# Patient Record
Sex: Male | Born: 1955 | Race: White | Hispanic: No | Marital: Married | Smoking: Former smoker
Health system: Southern US, Community
[De-identification: ages and names within clinical notes are randomized; demographics above are authoritative.]

## PROBLEM LIST (undated history)

## (undated) DIAGNOSIS — S82839A Other fracture of upper and lower end of unspecified fibula, initial encounter for closed fracture: Secondary | ICD-10-CM

## (undated) DIAGNOSIS — N200 Calculus of kidney: Secondary | ICD-10-CM

## (undated) DIAGNOSIS — Z923 Personal history of irradiation: Secondary | ICD-10-CM

## (undated) HISTORY — PX: COLONOSCOPY: SHX174

---

## 2004-12-26 ENCOUNTER — Ambulatory Visit (HOSPITAL_COMMUNITY): Admission: RE | Admit: 2004-12-26 | Discharge: 2004-12-26 | Payer: Self-pay

## 2006-11-22 ENCOUNTER — Ambulatory Visit: Payer: Self-pay | Admitting: Cardiology

## 2006-11-29 ENCOUNTER — Ambulatory Visit: Payer: Self-pay

## 2006-12-15 ENCOUNTER — Ambulatory Visit: Payer: Self-pay | Admitting: Cardiology

## 2006-12-22 ENCOUNTER — Ambulatory Visit: Payer: Self-pay | Admitting: Internal Medicine

## 2009-02-06 ENCOUNTER — Encounter: Admission: RE | Admit: 2009-02-06 | Discharge: 2009-02-06 | Payer: Self-pay | Admitting: Family Medicine

## 2011-04-02 NOTE — Assessment & Plan Note (Signed)
[facility]                            CARDIOLOGY OFFICE NOTE   TYREES, CHOPIN                    MRN:          [id number]  DATE:12/15/2006                            DOB:          [birth year]    REASON FOR VISIT:  Followup cardiac testing.   HISTORY OF PRESENT ILLNESS:  I saw Mr. Zeiss back on the 8th of  January.  His history is detailed in my previous note.  He was referred  by Dr. Andi Devon with history of fairly atypical left chest, left flank  and left arm discomfort described as a pin-like sensation.  This was  in the setting, however, of moderate pretest probability for coronary  artery disease associated with probable hypertension and a remote  tobacco use history.  I referred him for a baseline exercise Myoview,  which revealed no electrocardiographic abnormalities suggestive of  ischemia.  The perfusion data showed an ejection fraction of 62% with no  wall motion abnormalities.  No clear evidence of scar or ischemia was  noted with some equivocal apical and inferior basal thinning, which  could potentially have been artifactual.  Overall, this was a low-risk  study, and I reviewed this with the patient and his wife today.  He has,  in fact, noted no symptoms since I last saw him.  Today, I underscored  for him the importance of establishing with a primary care Georgi Navarrete to  follow up on his blood pressure and for other risk modification  strategies and health maintenance.  He has not had a regular physician  for quite some time.  I offered to make a referral to one our nearby  regional offices.   ALLERGIES:  NO KNOWN DRUG ALLERGIES.   PRESENT MEDICATIONS:  None chronically.   REVIEW OF SYSTEMS:  As described in history of present illness.   EXAMINATION:  Blood pressure today is 138/82.  Heart rate is 68.  Weight  is 243 pounds.  Patient is comfortable and in no acute distress.  There were no major changes in his baseline  examination compared to his  last visit.   IMPRESSION AND RECOMMENDATIONS:  1. Low risk Myocardial perfusion study as outlined in the setting of      fairly atypical symptoms for ischemia.  I think at this point      observation and referral for primary care evaluation for basic risk      factor modification and health maintenance are in order.  Would not      pursue any additional ischemia evaluation at this point.  We will      make a referral to one of our regional primary care offices, and he      can proceed from there.  2. Cardiology followup will be p.r.n.     Jonelle Sidle, MD  Electronically Signed    SGM/MedQ  DD: 12/15/2006  DT: 12/15/2006  Job #: [id number]

## 2011-10-16 DIAGNOSIS — S82839A Other fracture of upper and lower end of unspecified fibula, initial encounter for closed fracture: Secondary | ICD-10-CM

## 2011-10-16 HISTORY — DX: Other fracture of upper and lower end of unspecified fibula, initial encounter for closed fracture: S82.839A

## 2011-10-30 ENCOUNTER — Emergency Department (HOSPITAL_COMMUNITY)
Admission: EM | Admit: 2011-10-30 | Discharge: 2011-10-30 | Disposition: A | Discharge status: Home / Self Care | Payer: BC Managed Care – PPO | Attending: Emergency Medicine | Admitting: Emergency Medicine

## 2011-10-30 ENCOUNTER — Emergency Department (HOSPITAL_COMMUNITY): Payer: BC Managed Care – PPO

## 2011-10-30 ENCOUNTER — Encounter: Payer: Self-pay | Admitting: *Deleted

## 2011-10-30 DIAGNOSIS — W11XXXA Fall on and from ladder, initial encounter: Secondary | ICD-10-CM | POA: Insufficient documentation

## 2011-10-30 DIAGNOSIS — Y990 Civilian activity done for income or pay: Secondary | ICD-10-CM | POA: Insufficient documentation

## 2011-10-30 DIAGNOSIS — M25579 Pain in unspecified ankle and joints of unspecified foot: Secondary | ICD-10-CM | POA: Insufficient documentation

## 2011-10-30 DIAGNOSIS — E119 Type 2 diabetes mellitus without complications: Secondary | ICD-10-CM | POA: Insufficient documentation

## 2011-10-30 DIAGNOSIS — S82409A Unspecified fracture of shaft of unspecified fibula, initial encounter for closed fracture: Secondary | ICD-10-CM | POA: Insufficient documentation

## 2011-10-30 DIAGNOSIS — Y9269 Other specified industrial and construction area as the place of occurrence of the external cause: Secondary | ICD-10-CM | POA: Insufficient documentation

## 2011-10-30 MED ORDER — HYDROCODONE-ACETAMINOPHEN 5-325 MG PO TABS: 1.0000 | ORAL_TABLET | ORAL | Status: AC | PRN

## 2011-10-30 MED ORDER — IBUPROFEN 800 MG PO TABS: 800.0000 mg | ORAL_TABLET | Freq: Three times a day (TID) | ORAL | Status: AC

## 2011-10-30 NOTE — ED Provider Notes (Signed)
History     CSN: [id number] Arrival date & time: 10/30/2011  3:56 PM   First MD Initiated Contact with Patient 10/30/11 1617      Chief Complaint  Patient presents with  . Ankle Pain    fall    (Consider location/radiation/quality/duration/timing/severity/associated sxs/prior treatment) HPI Comments: Patient reports that he was at work and his foot slipped while he was on a ladder.  His left ankle then twisted and he landed on his left foot.  He has been having pain and swelling of the left ankle since that time.  Patient is a 55 y.o. male presenting with ankle pain. The history is provided by the patient.  Ankle Pain  The incident occurred 1 to 2 hours ago. The incident occurred at work. The pain is present in the left ankle. The pain is moderate. Pertinent negatives include no numbness, no inability to bear weight, no loss of motion, no muscle weakness, no loss of sensation and no tingling. He reports no foreign bodies present.    Past Medical History  Diagnosis Date  . Diabetes mellitus     History reviewed. No pertinent past surgical history.  No family history on file.  History  Substance Use Topics  . Smoking status: Never Smoker   . Smokeless tobacco: Not on file  . Alcohol Use: No      Review of Systems  Constitutional: Negative for fever and chills.  HENT: Negative for neck pain and neck stiffness.   Eyes: Negative for visual disturbance.  Respiratory: Negative for chest tightness and shortness of breath.   Cardiovascular: Negative for chest pain.  Gastrointestinal: Negative for abdominal pain.  Musculoskeletal: Positive for joint swelling. Negative for back pain.  Neurological: Negative for dizziness, tingling, syncope, light-headedness and numbness.  Psychiatric/Behavioral: Negative for confusion.    Allergies  Review of patient's allergies indicates no known allergies.  Home Medications   Current Outpatient Rx  Name Route Sig Dispense Refill  .  METFORMIN HCL 500 MG PO TABS Oral Take 500 mg by mouth 2 (two) times daily with a meal.        BP 159/88  Pulse 90  Temp(Src) 98.7 F (37.1 C) (Oral)  Resp 20  SpO2 99%  Physical Exam  Constitutional: He is oriented to person, place, and time. He appears well-developed and well-nourished.  HENT:  Head: Normocephalic and atraumatic.  Neck: Normal range of motion. Neck supple.  Cardiovascular: Normal rate, regular rhythm and normal heart sounds.   Pulmonary/Chest: Effort normal and breath sounds normal.  Musculoskeletal:       Left knee: Normal.       Left ankle: He exhibits decreased range of motion and swelling. He exhibits no ecchymosis, no deformity, no laceration and normal pulse. tenderness. Lateral malleolus tenderness found.       Significant swelling of the lateral malleolus.  Patient able to move toes.  Normal sensation.  Dorsal pedis pulse 2+ on left foot.    Neurological: He is alert and oriented to person, place, and time.  Skin: Skin is warm and dry.  Psychiatric: He has a normal mood and affect.    ED Course  Procedures (including critical care time)  Labs Reviewed - No data to display Dg Ankle Complete Left  10/30/2011  *RADIOLOGY REPORT*  Clinical Data: Larey Seat from ladder  LEFT ANKLE COMPLETE - 3+ VIEW  Comparison: None  Findings: Lateral soft tissue swelling noted.  There is a minimally displaced fracture deformity involving the distal fibula.  Mild posterior displacement of the distal fracture fragments.  IMPRESSION:  1.  Distal fibular fracture with mild posterior displacement of the distal fracture fragments. 2.  Soft tissue swelling.  Original Report Authenticated By: Rosealee Albee, M.D.     1. Closed fibular fracture    Discussed results of the xray with Dr. Lynelle Doctor who recommended a posterior splint and stirrup along with crutches.   Patient splinted by ortho tech in the ED and given crutches.    MDM  Closed fracture of the fibula.  Neurovascularly  intact.  Will have patient follow up with Orthopedics.        Pascal Lux Wingen 10/31/11 0402

## 2011-10-30 NOTE — ED Notes (Signed)
Pt fell off ladder with injury to left ankle

## 2011-10-31 NOTE — ED Provider Notes (Signed)
Pt discussed with me Medical screening examination/treatment/procedure(s) were performed by non-physician practitioner and as supervising physician I was immediately available for consultation/collaboration. Devoria Albe, MD, Armando Gang   Ward Givens, MD 10/31/11 [phone]

## 2012-01-25 ENCOUNTER — Encounter (HOSPITAL_COMMUNITY): Payer: Self-pay | Admitting: *Deleted

## 2012-01-25 ENCOUNTER — Emergency Department (HOSPITAL_COMMUNITY)
Admission: EM | Admit: 2012-01-25 | Discharge: 2012-01-26 | Disposition: A | Discharge status: Home / Self Care | Payer: BC Managed Care – PPO | Attending: Emergency Medicine | Admitting: Emergency Medicine

## 2012-01-25 ENCOUNTER — Emergency Department (HOSPITAL_COMMUNITY): Payer: BC Managed Care – PPO

## 2012-01-25 DIAGNOSIS — N200 Calculus of kidney: Secondary | ICD-10-CM | POA: Insufficient documentation

## 2012-01-25 DIAGNOSIS — R109 Unspecified abdominal pain: Secondary | ICD-10-CM | POA: Insufficient documentation

## 2012-01-25 DIAGNOSIS — R110 Nausea: Secondary | ICD-10-CM | POA: Insufficient documentation

## 2012-01-25 DIAGNOSIS — E119 Type 2 diabetes mellitus without complications: Secondary | ICD-10-CM | POA: Insufficient documentation

## 2012-01-25 LAB — URINALYSIS, ROUTINE W REFLEX MICROSCOPIC
Ketones, ur: 40 mg/dL — AB
Leukocytes, UA: NEGATIVE
Nitrite: NEGATIVE
Protein, ur: NEGATIVE mg/dL
Urobilinogen, UA: 0.2 mg/dL (ref 0.0–1.0)

## 2012-01-25 LAB — POCT I-STAT, CHEM 8
Calcium, Ion: 1.21 mmol/L (ref 1.12–1.32)
Glucose, Bld: 145 mg/dL — ABNORMAL HIGH (ref 70–99)
HCT: 47 % (ref 39.0–52.0)
Hemoglobin: 16 g/dL (ref 13.0–17.0)

## 2012-01-25 MED ORDER — SODIUM CHLORIDE 0.9 % IV BOLUS (SEPSIS): 1000.0000 mL | Freq: Once | INTRAVENOUS | Status: DC

## 2012-01-25 MED ORDER — HYDROMORPHONE HCL PF 1 MG/ML IJ SOLN
1.0000 mg | Freq: Once | INTRAMUSCULAR | Status: AC
  Administered 2012-01-26: 1 mg via INTRAVENOUS
  Filled 2012-01-25: qty 1

## 2012-01-25 MED ORDER — MORPHINE SULFATE 4 MG/ML IJ SOLN
4.0000 mg | Freq: Once | INTRAMUSCULAR | Status: AC
  Administered 2012-01-25: 4 mg via INTRAVENOUS
  Filled 2012-01-25: qty 1

## 2012-01-25 MED ORDER — SODIUM CHLORIDE 0.9 % IV BOLUS (SEPSIS)
1000.0000 mL | Freq: Once | INTRAVENOUS | Status: AC
  Administered 2012-01-26: 1000 mL via INTRAVENOUS

## 2012-01-25 MED ORDER — SODIUM CHLORIDE 0.9 % IV BOLUS (SEPSIS)
1000.0000 mL | Freq: Once | INTRAVENOUS | Status: AC
  Administered 2012-01-25: 1000 mL via INTRAVENOUS

## 2012-01-25 NOTE — ED Provider Notes (Signed)
History     CSN: [id number]  Arrival date & time 01/25/12  2008   First MD Initiated Contact with Patient 01/25/12 2137      Chief Complaint  Patient presents with  . Flank Pain    LT side    (Consider location/radiation/quality/duration/timing/severity/associated sxs/prior treatment) HPI He outpatient a 56 year old male presents today complaining of left-sided flank pain he ranks it 10 out of 10. This began suddenly at 4 PM. Patient was sitting still and began. This feels very similar to previous kidney stone. Nothing has made it better or worse. Patient has had nausea but no vomiting. He denies any fevers. Pain does radiate to the left lower abdomen. Patient denies any difficulties with constipation or diarrhea. Patient has been seen by Dr. Brunilda Payor in the past for this. He has not required surgical intervention previously. There are no other associated or modifying factors. Past Medical History  Diagnosis Date  . Diabetes mellitus     History reviewed. No pertinent past surgical history.  History reviewed. No pertinent family history.  History  Substance Use Topics  . Smoking status: Never Smoker   . Smokeless tobacco: Not on file  . Alcohol Use: No      Review of Systems  Constitutional: Negative.   HENT: Negative.   Eyes: Negative.   Respiratory: Negative.   Cardiovascular: Negative.   Gastrointestinal: Positive for nausea and abdominal pain.  Genitourinary: Positive for flank pain.  Musculoskeletal: Negative.   Skin: Negative.   Neurological: Negative.   Hematological: Negative.   Psychiatric/Behavioral: Negative.   All other systems reviewed and are negative.    Allergies  Review of patient's allergies indicates no known allergies.  Home Medications   Current Outpatient Rx  Name Route Sig Dispense Refill  . METFORMIN HCL 500 MG PO TABS Oral Take 500 mg by mouth 2 (two) times daily with a meal.     . OVER THE COUNTER MEDICATION Oral Take 1 tablet by  mouth daily. Melaleuca prostate "vitamin."      BP 159/82  Pulse 83  Temp(Src) 98.6 F (37 C) (Oral)  Resp 18  SpO2 98%  Physical Exam  Nursing note and vitals reviewed. GEN: Well-developed, well-nourished male in no distress HEENT: Atraumatic, normocephalic. Oropharynx clear without erythema EYES: PERRLA BL, no scleral icterus. NECK: Trachea midline, no meningismus CV: regular rate and rhythm. No murmurs, rubs, or gallops PULM: No respiratory distress.  No crackles, wheezes, or rales. GI: soft, non-tender. No guarding, rebound, or tenderness. + bowel sounds  GU: Patient with left costovertebral angle tenderness. Neuro: cranial nerves 2-12 intact, no abnormalities of strength or sensation, A and O x 3 MSK: Patient moves all 4 extremities symmetrically, no deformity, edema, or injury noted Skin: No rashes petechiae, purpura, or jaundice Psych: no abnormality of mood   ED Course  Procedures (including critical care time)  Labs Reviewed  GLUCOSE, CAPILLARY - Abnormal; Notable for the following:    Glucose-Capillary 142 (*)    All other components within normal limits  POCT I-STAT, CHEM 8 - Abnormal; Notable for the following:    Creatinine, Ser 1.40 (*)    Glucose, Bld 145 (*)    All other components within normal limits  URINALYSIS, ROUTINE W REFLEX MICROSCOPIC   Ct Abdomen Pelvis Wo Contrast  01/25/2012  *RADIOLOGY REPORT*  Clinical Data: Left flank pain  CT ABDOMEN AND PELVIS WITHOUT CONTRAST  Technique:  Multidetector CT imaging of the abdomen and pelvis was performed following the standard protocol  without intravenous contrast.  Comparison: 12/26/2004  Findings: Limited images through the lung bases demonstrate no significant appreciable abnormality. The heart size is within normal limits. No pleural or pericardial effusion.  Intra-abdominal organ evaluation is limited without intravenous contrast.  This limitation, fatty liver.  Unremarkable biliary system, spleen,  pancreas, adrenal glands.  Unremarkable right kidney.  Left renal edema and perinephric fat stranding.  There is mild left hydroureteronephrosis to the level of a 6 mm stone within the proximal left ureter.  Colonic diverticulosis without CT evidence for diverticulitis.  No bowel obstruction.  Normal appendix.  No free intraperitoneal air or fluid.  Prostatomegaly.  Decompressed bladder.  Normal caliber vasculature.  Multilevel degenerative changes of the imaged spine. No acute or aggressive appearing osseous lesion.  IMPRESSION: Mild left hydroureteronephrosis to the level of a 6 mm proximal left ureteral stone.  There is left renal edema and perinephric fat stranding.  Correlate with urinalysis if concern for superimposed infection.  Prostatomegaly.  Hepatic steatosis.  Original Report Authenticated By: Waneta Martins, M.D.     No diagnosis found.    MDM  Patient was evaluated by myself. Based on presentation he had workup for possible kidney stone. Patient did have elevated BUN/creatinine ratio with creatinine of 1.4. Also on CT of the abdomen and pelvis without contrast patient did have evidence of a 6 mm stone in the proximal left ureter with evidence of mild left hydro-ureter nephrosis. Patient was treated with IV fluids as well as morphine.   See down time form for patient final disposition and addendum to this note.     Cyndra Numbers, MD 01/26/12 [phone]

## 2012-01-25 NOTE — ED Notes (Signed)
Pt in c/o left flank pain since yesterday, worse today, nausea

## 2012-01-26 ENCOUNTER — Encounter (HOSPITAL_COMMUNITY): Payer: Self-pay | Admitting: *Deleted

## 2012-01-26 ENCOUNTER — Other Ambulatory Visit: Payer: Self-pay | Admitting: Urology

## 2012-01-26 ENCOUNTER — Encounter (HOSPITAL_COMMUNITY): Payer: Self-pay | Admitting: Pharmacy Technician

## 2012-01-26 MED ORDER — ONDANSETRON 8 MG PO TBDP: 8.0000 mg | ORAL_TABLET | Freq: Three times a day (TID) | ORAL | Status: AC | PRN

## 2012-01-26 MED ORDER — OXYCODONE-ACETAMINOPHEN 5-325 MG PO TABS
ORAL_TABLET | ORAL | Status: AC
  Filled 2012-01-26: qty 2

## 2012-01-26 MED ORDER — OXYCODONE-ACETAMINOPHEN 5-325 MG PO TABS: 2.0000 | ORAL_TABLET | Freq: Four times a day (QID) | ORAL | Status: AC | PRN

## 2012-01-27 ENCOUNTER — Ambulatory Visit (HOSPITAL_COMMUNITY)
Admission: RE | Admit: 2012-01-27 | Discharge: 2012-01-27 | Disposition: A | Discharge status: Home / Self Care | Payer: BC Managed Care – PPO | Source: Ambulatory Visit | Attending: Urology | Admitting: Urology

## 2012-01-27 ENCOUNTER — Encounter (HOSPITAL_COMMUNITY): Payer: Self-pay | Admitting: *Deleted

## 2012-01-27 ENCOUNTER — Ambulatory Visit (HOSPITAL_COMMUNITY): Payer: BC Managed Care – PPO

## 2012-01-27 ENCOUNTER — Encounter (HOSPITAL_COMMUNITY)
Admission: RE | Disposition: A | Discharge status: Home / Self Care | Payer: Self-pay | Source: Ambulatory Visit | Attending: Urology

## 2012-01-27 DIAGNOSIS — N1330 Unspecified hydronephrosis: Secondary | ICD-10-CM | POA: Insufficient documentation

## 2012-01-27 DIAGNOSIS — E119 Type 2 diabetes mellitus without complications: Secondary | ICD-10-CM | POA: Insufficient documentation

## 2012-01-27 DIAGNOSIS — N201 Calculus of ureter: Secondary | ICD-10-CM | POA: Insufficient documentation

## 2012-01-27 HISTORY — DX: Calculus of kidney: N20.0

## 2012-01-27 HISTORY — DX: Other fracture of upper and lower end of unspecified fibula, initial encounter for closed fracture: S82.839A

## 2012-01-27 LAB — GLUCOSE, CAPILLARY
Glucose-Capillary: 141 mg/dL — ABNORMAL HIGH (ref 70–99)
Glucose-Capillary: 105 mg/dL — ABNORMAL HIGH (ref 70–99)

## 2012-01-27 SURGERY — LITHOTRIPSY, ESWL
Anesthesia: LOCAL | Laterality: Left

## 2012-01-27 MED ORDER — DIAZEPAM 5 MG PO TABS
10.0000 mg | ORAL_TABLET | ORAL | Status: AC
  Administered 2012-01-27: 10 mg via ORAL

## 2012-01-27 MED ORDER — CIPROFLOXACIN HCL 500 MG PO TABS
ORAL_TABLET | ORAL | Status: AC
  Administered 2012-01-27: 500 mg via ORAL
  Filled 2012-01-27: qty 1

## 2012-01-27 MED ORDER — DIAZEPAM 5 MG PO TABS
ORAL_TABLET | ORAL | Status: AC
  Administered 2012-01-27: 10 mg via ORAL
  Filled 2012-01-27: qty 2

## 2012-01-27 MED ORDER — CIPROFLOXACIN HCL 500 MG PO TABS
500.0000 mg | ORAL_TABLET | ORAL | Status: AC
  Administered 2012-01-27: 500 mg via ORAL

## 2012-01-27 MED ORDER — SODIUM CHLORIDE 0.9 % IV SOLN: INTRAVENOUS | Status: DC

## 2012-01-27 MED ORDER — DIPHENHYDRAMINE HCL 25 MG PO CAPS
ORAL_CAPSULE | ORAL | Status: AC
  Administered 2012-01-27: 25 mg via ORAL
  Filled 2012-01-27: qty 1

## 2012-01-27 MED ORDER — DEXTROSE-NACL 5-0.45 % IV SOLN: INTRAVENOUS | Status: DC

## 2012-01-27 MED ORDER — DIPHENHYDRAMINE HCL 25 MG PO CAPS
25.0000 mg | ORAL_CAPSULE | ORAL | Status: AC
  Administered 2012-01-27: 25 mg via ORAL

## 2012-01-27 NOTE — Progress Notes (Signed)
IV attempted x3 total without success (two different RN's). Called [facility] on [employer] and she said to bring patient now and they will start it there.

## 2012-01-27 NOTE — Progress Notes (Signed)
Patient given strainer and collection cup. Verbalized understanding of all d/c instruction. Voided qs. D/C home with wife at 1600.

## 2012-01-27 NOTE — H&P (Signed)
History of Present Illness  Barry Gray was seen in the ER last night with sudden onset of severe left flank pain that started around 4 PM yesterday.  The pain was associated with nausea.  He has a past history of kidney stone.  CT scan revealed mild left hydronephrosis secondary to a 6 mm proximal ureteral calculus.  He wa sent home on oral analgesics, but he has not filled the prescription yet.  He now has moderate left flank pain.  He voids well, denies dysuria, hematuria, frequency or urgency.   Past Medical History Problems  1. History of  Diabetes Mellitus 250.00  Surgical History Problems  1. History of  No Surgical Problems  Current Meds 1. MetFORMIN HCl 1000 MG Oral Tablet; Therapy: (Recorded:14Feb2012) to  Allergies Medication  1. No Known Drug Allergies  Family History Problems  1. Family history of  Death In The Family Mother 36; ovarian cancer 2. Family history of  Family Health Status - Father's Age 34 3. Family history of  Family Health Status Children ___ Living Daughters 1 4. Maternal history of  Ovarian Cancer V16.41  Social History Problems  1. Caffeine Use 3 2. Former Smoker V15.82 smoked 1 ppd for 10 years; quit 18 years ago 3. 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 History of  Alcohol Use  Review of Systems  As per HPI. Everything else is unchanged since 12/28/10.     Vitals Vital Signs [Data Includes: Last 1 Day]  13Mar2013 10:07AM  BMI Calculated: 33.36 BSA Calculated: 2.23 Weight: 233 lb  Blood Pressure: 141 / 89 Temperature: 98.3 F Heart Rate: 76 Respiration: 18  Physical Exam Constitutional: Well nourished and well developed . No acute distress.  ENT:. The ears and nose are normal in appearance.  Neck: The appearance of the neck is normal and no neck mass is present.  Pulmonary: No respiratory distress and normal respiratory rhythm and effort.  Cardiovascular: Heart rate and rhythm are  normal . No peripheral edema.  Abdomen: The abdomen is soft and nontender. No masses are palpated. No CVA tenderness. No hernias are palpable. No hepatosplenomegaly noted.  Genitourinary: Examination of the penis demonstrates no discharge, no masses, no lesions and a normal meatus. The scrotum is without lesions. The right epididymis is palpably normal and non-tender. The left epididymis is palpably normal and non-tender. The right testis is non-tender and without masses. The left testis is non-tender and without masses.  Lymphatics: The femoral and inguinal nodes are not enlarged or tender.  Skin: Normal skin turgor, no visible rash and no visible skin lesions.  Neuro/Psych:. Mood and affect are appropriate.    Results/Data Urine [Data Includes: Last 1 Day]   13Mar2013  COLOR YELLOW   APPEARANCE CLEAR   SPECIFIC GRAVITY 1.025   pH 5.5   GLUCOSE NEG mg/dL  BILIRUBIN NEG   KETONE 15 mg/dL  BLOOD LARGE   PROTEIN NEG mg/dL  UROBILINOGEN 0.2 mg/dL  NITRITE NEG   LEUKOCYTE ESTERASE NEG   SQUAMOUS EPITHELIAL/HPF RARE   WBC 0-3 WBC/hpf  RBC 11-20 RBC/hpf  BACTERIA RARE   CRYSTALS NONE SEEN   CASTS NONE SEEN     I independently reviewed the CT scan with the patient and the findings are as noted above.   Assessment Assessed  1. Proximal Ureteral Stone On The Left 592.1 2. Hydronephrosis 591  Plan Health Maintenance (V70.0)  1. UA With REFLEX  Done: 13Mar2013 09:58AM   I discussed treatment  options with the patient: ureteroscopy versus ESL. The risks, benefits of each option were explained in detail.   He states that he does not like to have a stent. He prefers to have ESL.  The risks of ESL include but are not limited to hemorrhage, renal or perirenal hematoma, injury to adjacent organs, inability to fragment the stone, steinstrase.  He understands and wishes to proceed.  I gave him 15 mgm of Morphine and 25 mgm of Phenergan IM.  He will get his pain medication today and start taking  it if needed.  Rapaflo 8 mgm daily.   Signatures Electronically signed by : Su Grand, M.D.; Jan 26 2012 10:51AM

## 2012-01-27 NOTE — Op Note (Signed)
Refer to [facility] Op Note scanned in the chart 

## 2012-01-28 ENCOUNTER — Encounter (HOSPITAL_COMMUNITY): Payer: Self-pay

## 2018-02-24 ENCOUNTER — Other Ambulatory Visit: Payer: Self-pay | Admitting: Urology

## 2018-02-24 DIAGNOSIS — R972 Elevated prostate specific antigen [PSA]: Secondary | ICD-10-CM

## 2018-04-03 ENCOUNTER — Ambulatory Visit
Admission: RE | Admit: 2018-04-03 | Discharge: 2018-04-03 | Disposition: A | Discharge status: Home / Self Care | Payer: Self-pay | Source: Ambulatory Visit | Attending: Urology | Admitting: Urology

## 2018-04-03 DIAGNOSIS — R972 Elevated prostate specific antigen [PSA]: Secondary | ICD-10-CM

## 2018-04-03 MED ORDER — GADOBENATE DIMEGLUMINE 529 MG/ML IV SOLN
19.0000 mL | Freq: Once | INTRAVENOUS | Status: AC | PRN
  Administered 2018-04-03: 19 mL via INTRAVENOUS

## 2022-04-09 ENCOUNTER — Other Ambulatory Visit: Payer: Self-pay | Admitting: Orthopedic Surgery

## 2022-04-09 DIAGNOSIS — M542 Cervicalgia: Secondary | ICD-10-CM

## 2022-04-09 DIAGNOSIS — Z1289 Encounter for screening for malignant neoplasm of other sites: Secondary | ICD-10-CM

## 2022-04-16 ENCOUNTER — Ambulatory Visit
Admission: RE | Admit: 2022-04-16 | Discharge: 2022-04-16 | Disposition: A | Discharge status: Home / Self Care | Payer: Medicare Other | Source: Ambulatory Visit | Attending: Orthopedic Surgery | Admitting: Orthopedic Surgery

## 2022-04-16 ENCOUNTER — Other Ambulatory Visit: Payer: Self-pay | Admitting: Orthopedic Surgery

## 2022-04-16 DIAGNOSIS — M542 Cervicalgia: Secondary | ICD-10-CM

## 2022-04-16 DIAGNOSIS — Z1289 Encounter for screening for malignant neoplasm of other sites: Secondary | ICD-10-CM

## 2022-04-16 MED ORDER — IOPAMIDOL (ISOVUE-300) INJECTION 61%
100.0000 mL | Freq: Once | INTRAVENOUS | Status: AC | PRN
  Administered 2022-04-16: 100 mL via INTRAVENOUS

## 2022-04-20 ENCOUNTER — Encounter: Payer: Self-pay | Admitting: *Deleted

## 2022-04-20 ENCOUNTER — Other Ambulatory Visit: Payer: Self-pay | Admitting: Orthopedic Surgery

## 2022-04-20 DIAGNOSIS — M542 Cervicalgia: Secondary | ICD-10-CM

## 2022-04-20 DIAGNOSIS — M5441 Lumbago with sciatica, right side: Secondary | ICD-10-CM

## 2022-04-20 DIAGNOSIS — M546 Pain in thoracic spine: Secondary | ICD-10-CM

## 2022-04-20 NOTE — Progress Notes (Signed)
Reached out to Murlean Hark to introduce myself as the office RN Navigator and explain our new patient process. Spoke to the patient and his wife.  Reviewed the reason for their referral and scheduled their new patient appointment along with labs. Provided address and directions to the office including call back phone number. Reviewed with patient any concerns they may have or any possible barriers to attending their appointment.   Informed patient about my role as a navigator and that I will meet with them prior to their New Patient appointment and more fully discuss what services I can provide. At this time patient has no further questions or needs.  Oncology Nurse Navigator Documentation     04/20/2022    9:45 AM  Oncology Nurse Navigator Flowsheets  Abnormal Finding Date 04/02/2022  Diagnosis Status Additional Work Up  Navigator Follow Up Date: 04/26/2022  Navigator Follow Up Reason: New Patient Appointment  Navigator Location [facility]  Referral Date to RadOnc/MedOnc 04/20/2022  Navigator Encounter Type Introductory Phone Call  Patient Visit Type MedOnc  Treatment Phase Abnormal Scans  Barriers/Navigation Needs Coordination of Care;Education  Education Other  Interventions Coordination of Care;Education  Acuity Level 2-Minimal Needs (1-2 Barriers Identified)  Coordination of Care Appts  Education Method Verbal  Support Groups/Services Friends and Family  Time Spent with Patient 78

## 2022-04-21 NOTE — Progress Notes (Signed)
Surgical Instructions    Your procedure is scheduled on Wednesday June 14.  Report to [facility] Main Entrance "A" at 5:30 A.M., then check in with the Admitting office.  Call this number if you have problems the morning of surgery:  [phone]   If you have any questions prior to your surgery date call [phone]: Open Monday-Friday 8am-4pm    Remember:  Do not eat after midnight the night before your surgery  You may drink clear liquids until 4:30am the morning of your surgery.   Clear liquids allowed are: Water, Non-Citrus Juices (without pulp), Carbonated Beverages, Clear Tea, Black Coffee ONLY (NO MILK, CREAM OR POWDERED CREAMER of any kind), and Gatorade Patient Instructions  The night before surgery:  No food after midnight. ONLY clear liquids after midnight  The day of surgery (if you have diabetes): Drink ONE (1) 12 oz G2 given to you in your pre admission testing appointment by 4:30AM the morning of surgery. Drink in one sitting. Do not sip.  This drink was given to you during your hospital  pre-op appointment visit.  Nothing else to drink after completing the  12 oz bottle of G2.         If you have questions, please contact your surgeon's office.     Take these medicines the morning of surgery with A SIP OF WATER:  oxyCODONE-acetaminophen (PERCOCET) if needed  As of today, STOP taking any Aspirin (unless otherwise instructed by your surgeon) Aleve, Naproxen, Ibuprofen, Motrin, Advil, Goody's, BC's, all herbal medications, fish oil, and all vitamins.  WHAT DO I DO ABOUT MY DIABETES MEDICATION?  Do not take oral diabetes medicines (pills) the morning of surgery.       DO NOT TAKE metFORMIN (GLUCOPHAGE) THE MORNING OF SURGERY.  HOW TO MANAGE YOUR DIABETES BEFORE AND AFTER SURGERY  Why is it important to control my blood sugar before and after surgery? Improving blood sugar levels before and after surgery helps healing and can limit problems. A way of  improving blood sugar control is eating a healthy diet by:  Eating less sugar and carbohydrates  Increasing activity/exercise  Talking with your doctor about reaching your blood sugar goals High blood sugars (greater than 180 mg/dL) can raise your risk of infections and slow your recovery, so you will need to focus on controlling your diabetes during the weeks before surgery. Make sure that the doctor who takes care of your diabetes knows about your planned surgery including the date and location.  How do I manage my blood sugar before surgery? Check your blood sugar at least 4 times a day, starting 2 days before surgery, to make sure that the level is not too high or low.  Check your blood sugar the morning of your surgery when you wake up and every 2 hours until you get to the Short Stay unit.  If your blood sugar is less than 70 mg/dL, you will need to treat for low blood sugar: Do not take insulin. Treat a low blood sugar (less than 70 mg/dL) with  cup of clear juice (cranberry or apple), 4 glucose tablets, OR glucose gel. Recheck blood sugar in 15 minutes after treatment (to make sure it is greater than 70 mg/dL). If your blood sugar is not greater than 70 mg/dL on recheck, call [phone] for further instructions. Report your blood sugar to the short stay nurse when you get to Short Stay.  If you are admitted to the hospital after surgery: Your blood  sugar will be checked by the staff and you will probably be given insulin after surgery (instead of oral diabetes medicines) to make sure you have good blood sugar levels. The goal for blood sugar control after surgery is 80-180 mg/dL.           Do not wear jewelry or makeup Do not wear lotions, powders, perfumes/colognes, or deodorant. Do not shave 48 hours prior to surgery.  Men may shave face and neck. Do not bring valuables to the hospital. Do not wear nail polish, gel polish, artificial nails, or any other type of covering on  natural nails (fingers and toes) If you have artificial nails or gel coating that need to be removed by a nail salon, please have this removed prior to surgery. Artificial nails or gel coating may interfere with anesthesia's ability to adequately monitor your vital signs.  [facility] is not responsible for any belongings or valuables. .   Do NOT Smoke (Tobacco/Vaping)  24 hours prior to your procedure  If you use a CPAP at night, you may bring your mask for your overnight stay.   Contacts, glasses, hearing aids, dentures or partials may not be worn into surgery, please bring cases for these belongings   For patients admitted to the hospital, discharge time will be determined by your treatment team.   Patients discharged the day of surgery will not be allowed to drive home, and someone needs to stay with them for 24 hours.   SURGICAL WAITING ROOM VISITATION Patients having surgery or a procedure in a hospital may have two support people. Children under the age of 33 must have an adult with them who is not the patient. They may stay in the waiting area during the procedure and may switch out with other visitors. If the patient needs to stay at the hospital during part of their recovery, the visitor guidelines for inpatient rooms apply.  Please refer to the [facility] website for the visitor guidelines for Inpatients (after your surgery is over and you are in a regular room).       Special instructions:    Oral Hygiene is also important to reduce your risk of infection.  Remember - BRUSH YOUR TEETH THE MORNING OF SURGERY WITH YOUR REGULAR TOOTHPASTE   [facility]- Preparing For Surgery  Before surgery, you can play an important role. Because skin is not sterile, your skin needs to be as free of germs as possible. You can reduce the number of germs on your skin by washing with CHG (chlorahexidine gluconate) Soap before surgery.  CHG is an antiseptic cleaner which kills germs and bonds  with the skin to continue killing germs even after washing.     Please do not use if you have an allergy to CHG or antibacterial soaps. If your skin becomes reddened/irritated stop using the CHG.  Do not shave (including legs and underarms) for at least 48 hours prior to first CHG shower. It is OK to shave your face.  Please follow these instructions carefully.     Shower the NIGHT BEFORE SURGERY and the MORNING OF SURGERY with CHG Soap.   If you chose to wash your hair, wash your hair first as usual with your normal shampoo. After you shampoo, rinse your hair and body thoroughly to remove the shampoo.  Then [employer] and genitals (private parts) with your normal soap and rinse thoroughly to remove soap.  After that Use CHG Soap as you would any other liquid  soap. You can apply CHG directly to the skin and wash gently with a scrungie or a clean washcloth.   Apply the CHG Soap to your body ONLY FROM THE NECK DOWN.  Do not use on open wounds or open sores. Avoid contact with your eyes, ears, mouth and genitals (private parts). Wash Face and genitals (private parts)  with your normal soap.   Wash thoroughly, paying special attention to the area where your surgery will be performed.  Thoroughly rinse your body with warm water from the neck down.  DO NOT shower/wash with your normal soap after using and rinsing off the CHG Soap.  Pat yourself dry with a CLEAN TOWEL.  Wear CLEAN PAJAMAS to bed the night before surgery  Place CLEAN SHEETS on your bed the night before your surgery  DO NOT SLEEP WITH PETS.   Day of Surgery:  Take a shower with CHG soap. Wear Clean/Comfortable clothing the morning of surgery Do not apply any deodorants/lotions.   Remember to brush your teeth WITH YOUR REGULAR TOOTHPASTE.    If you received a COVID test during your pre-op visit, it is requested that you wear a mask when out in public, stay away from anyone that may not be feeling well, and notify your  surgeon if you develop symptoms. If you have been in contact with anyone that has tested positive in the last 10 days, please notify your surgeon.    Please read over the following fact sheets that you were given.

## 2022-04-22 ENCOUNTER — Encounter (HOSPITAL_COMMUNITY)
Admission: RE | Admit: 2022-04-22 | Discharge: 2022-04-22 | Disposition: A | Discharge status: Home / Self Care | Payer: Medicare Other | Source: Ambulatory Visit | Attending: Orthopedic Surgery | Admitting: Orthopedic Surgery

## 2022-04-22 ENCOUNTER — Other Ambulatory Visit: Payer: Self-pay

## 2022-04-22 ENCOUNTER — Encounter (HOSPITAL_COMMUNITY): Payer: Self-pay

## 2022-04-22 VITALS — BP 148/90 | HR 102 | Temp 97.8°F | Resp 20 | Ht 70.0 in

## 2022-04-22 DIAGNOSIS — E119 Type 2 diabetes mellitus without complications: Secondary | ICD-10-CM | POA: Insufficient documentation

## 2022-04-22 DIAGNOSIS — Z01818 Encounter for other preprocedural examination: Secondary | ICD-10-CM | POA: Insufficient documentation

## 2022-04-22 LAB — BASIC METABOLIC PANEL
Anion gap: 13 (ref 5–15)
BUN: 11 mg/dL (ref 8–23)
CO2: 21 mmol/L — ABNORMAL LOW (ref 22–32)
Calcium: 10.1 mg/dL (ref 8.9–10.3)
Chloride: 104 mmol/L (ref 98–111)
Creatinine, Ser: 1.12 mg/dL (ref 0.61–1.24)
GFR, Estimated: >60 mL/min (ref >60)
Glucose, Bld: 241 mg/dL — ABNORMAL HIGH (ref 70–99)
Potassium: 4.3 mmol/L (ref 3.5–5.1)
Sodium: 138 mmol/L (ref 135–145)

## 2022-04-22 LAB — CBC
HCT: 46.2 % (ref 39.0–52.0)
Hemoglobin: 15.3 g/dL (ref 13.0–17.0)
MCH: 30.8 pg (ref 26.0–34.0)
MCHC: 33.1 g/dL (ref 30.0–36.0)
MCV: 93.1 fL (ref 80.0–100.0)
Platelets: 369 10*3/uL (ref 150–400)
RBC: 4.96 MIL/uL (ref 4.22–5.81)
RDW: 12.9 % (ref 11.5–15.5)
WBC: 11.1 10*3/uL — ABNORMAL HIGH (ref 4.0–10.5)
nRBC: 0 % (ref 0.0–0.2)

## 2022-04-22 LAB — SURGICAL PCR SCREEN
MRSA, PCR: NEGATIVE
Staphylococcus aureus: NEGATIVE

## 2022-04-22 LAB — HEMOGLOBIN A1C
Hgb A1c MFr Bld: 7.2 % — ABNORMAL HIGH (ref 4.8–5.6)
Mean Plasma Glucose: 159.94 mg/dL

## 2022-04-22 LAB — GLUCOSE, CAPILLARY: Glucose-Capillary: 241 mg/dL — ABNORMAL HIGH (ref 70–99)

## 2022-04-22 NOTE — Progress Notes (Signed)
PCP - Ferd Hibbs NP Cardiologist - denies  PPM/ICD - denies Device Orders -  Rep Notified -   Chest x-ray - none EKG -04/22/22  Stress Test - none ECHO - none Cardiac Cath - none  Sleep Study - none CPAP -   Fasting Blood Sugar - pt states he does not have a blood sugar meter and does not check his sugar. Checks Blood Sugar _____ times a day  Blood Thinner Instructions:na Aspirin Instructions:na  ERAS Protcol - clear liquids until 0430 PRE-SURGERY Ensure or G2- G2  COVID TEST- na   Anesthesia review: no  Patient denies shortness of breath, fever, cough and chest pain at PAT appointment   All instructions explained to the patient, with a verbal understanding of the material. Patient agrees to go over the instructions while at home for a better understanding. Patient also instructed to wear a mask when out in public prior to surgery. The opportunity to ask questions was provided.

## 2022-04-23 ENCOUNTER — Ambulatory Visit
Admission: RE | Admit: 2022-04-23 | Discharge: 2022-04-23 | Disposition: A | Discharge status: Home / Self Care | Payer: Medicare Other | Source: Ambulatory Visit | Attending: Orthopedic Surgery | Admitting: Orthopedic Surgery

## 2022-04-23 ENCOUNTER — Inpatient Hospital Stay: Admission: RE | Admit: 2022-04-23 | Payer: Medicare Other | Source: Ambulatory Visit

## 2022-04-23 DIAGNOSIS — M542 Cervicalgia: Secondary | ICD-10-CM

## 2022-04-23 DIAGNOSIS — M546 Pain in thoracic spine: Secondary | ICD-10-CM

## 2022-04-23 DIAGNOSIS — M5441 Lumbago with sciatica, right side: Secondary | ICD-10-CM

## 2022-04-23 MED ORDER — GADOBENATE DIMEGLUMINE 529 MG/ML IV SOLN
18.0000 mL | Freq: Once | INTRAVENOUS | Status: AC | PRN
  Administered 2022-04-23: 18 mL via INTRAVENOUS

## 2022-04-26 ENCOUNTER — Encounter: Payer: Self-pay | Admitting: Hematology & Oncology

## 2022-04-26 ENCOUNTER — Inpatient Hospital Stay (HOSPITAL_BASED_OUTPATIENT_CLINIC_OR_DEPARTMENT_OTHER): Payer: Medicare Other | Admitting: Hematology & Oncology

## 2022-04-26 ENCOUNTER — Other Ambulatory Visit: Payer: Self-pay | Admitting: Oncology

## 2022-04-26 ENCOUNTER — Other Ambulatory Visit: Payer: Medicare Other

## 2022-04-26 ENCOUNTER — Inpatient Hospital Stay: Payer: Medicare Other | Attending: Hematology & Oncology

## 2022-04-26 ENCOUNTER — Ambulatory Visit (HOSPITAL_BASED_OUTPATIENT_CLINIC_OR_DEPARTMENT_OTHER)
Admission: RE | Admit: 2022-04-26 | Discharge: 2022-04-26 | Disposition: A | Discharge status: Home / Self Care | Payer: Medicare Other | Source: Ambulatory Visit | Attending: Hematology & Oncology | Admitting: Hematology & Oncology

## 2022-04-26 ENCOUNTER — Inpatient Hospital Stay: Payer: Medicare Other

## 2022-04-26 ENCOUNTER — Other Ambulatory Visit (HOSPITAL_BASED_OUTPATIENT_CLINIC_OR_DEPARTMENT_OTHER): Payer: Self-pay

## 2022-04-26 VITALS — BP 151/80 | HR 108 | Temp 98.1°F | Resp 20 | Ht 70.0 in | Wt 185.0 lb

## 2022-04-26 DIAGNOSIS — E119 Type 2 diabetes mellitus without complications: Secondary | ICD-10-CM

## 2022-04-26 DIAGNOSIS — R634 Abnormal weight loss: Secondary | ICD-10-CM

## 2022-04-26 DIAGNOSIS — Z87891 Personal history of nicotine dependence: Secondary | ICD-10-CM

## 2022-04-26 DIAGNOSIS — C9000 Multiple myeloma not having achieved remission: Secondary | ICD-10-CM | POA: Insufficient documentation

## 2022-04-26 DIAGNOSIS — M542 Cervicalgia: Secondary | ICD-10-CM

## 2022-04-26 DIAGNOSIS — M8448XA Pathological fracture, other site, initial encounter for fracture: Secondary | ICD-10-CM

## 2022-04-26 DIAGNOSIS — R531 Weakness: Secondary | ICD-10-CM | POA: Diagnosis not present

## 2022-04-26 LAB — CBC WITH DIFFERENTIAL (CANCER CENTER ONLY)
Abs Immature Granulocytes: 0.04 10*3/uL (ref 0.00–0.07)
Basophils Absolute: 0.1 10*3/uL (ref 0.0–0.1)
Basophils Relative: 1 %
Eosinophils Absolute: 0.1 10*3/uL (ref 0.0–0.5)
Eosinophils Relative: 1 %
HCT: 43.6 % (ref 39.0–52.0)
Hemoglobin: 14.8 g/dL (ref 13.0–17.0)
Immature Granulocytes: 0 %
Lymphocytes Relative: 21 %
Lymphs Abs: 2.2 10*3/uL (ref 0.7–4.0)
MCH: 31 pg (ref 26.0–34.0)
MCHC: 33.9 g/dL (ref 30.0–36.0)
MCV: 91.4 fL (ref 80.0–100.0)
Monocytes Absolute: 0.8 10*3/uL (ref 0.1–1.0)
Monocytes Relative: 8 %
Neutro Abs: 7.3 10*3/uL (ref 1.7–7.7)
Neutrophils Relative %: 69 %
Platelet Count: 368 10*3/uL (ref 150–400)
RBC: 4.77 MIL/uL (ref 4.22–5.81)
RDW: 12.8 % (ref 11.5–15.5)
WBC Count: 10.5 10*3/uL (ref 4.0–10.5)
nRBC: 0 % (ref 0.0–0.2)

## 2022-04-26 LAB — LACTATE DEHYDROGENASE: LDH: 169 U/L (ref 98–192)

## 2022-04-26 LAB — CMP (CANCER CENTER ONLY)
ALT: 14 U/L (ref 0–44)
AST: 17 U/L (ref 15–41)
Albumin: 4.7 g/dL (ref 3.5–5.0)
Alkaline Phosphatase: 65 U/L (ref 38–126)
Anion gap: 14 (ref 5–15)
BUN: 17 mg/dL (ref 8–23)
CO2: 26 mmol/L (ref 22–32)
Calcium: 10.8 mg/dL — ABNORMAL HIGH (ref 8.9–10.3)
Chloride: 97 mmol/L — ABNORMAL LOW (ref 98–111)
Creatinine: 1.32 mg/dL — ABNORMAL HIGH (ref 0.61–1.24)
GFR, Estimated: 60 mL/min — ABNORMAL LOW (ref >60)
Glucose, Bld: 175 mg/dL — ABNORMAL HIGH (ref 70–99)
Potassium: 4.1 mmol/L (ref 3.5–5.1)
Sodium: 137 mmol/L (ref 135–145)
Total Bilirubin: 0.7 mg/dL (ref 0.3–1.2)
Total Protein: 7.8 g/dL (ref 6.5–8.1)

## 2022-04-26 LAB — SAVE SMEAR(SSMR), FOR PROVIDER SLIDE REVIEW

## 2022-04-26 MED ORDER — SODIUM CHLORIDE 0.9 % IV SOLN
40.0000 mg | Freq: Once | INTRAVENOUS | Status: AC
  Administered 2022-04-26: 40 mg via INTRAVENOUS
  Filled 2022-04-26: qty 4

## 2022-04-26 MED ORDER — LACTULOSE ENCEPHALOPATHY 10 GM/15ML PO SOLN
ORAL | 1 refills | Status: DC
  Filled 2022-04-26: qty 450, 8d supply, fill #0
  Filled 2022-05-27: qty 450, 8d supply, fill #1

## 2022-04-26 MED ORDER — SODIUM CHLORIDE 0.9 % IV SOLN: INTRAVENOUS | Status: DC

## 2022-04-26 MED ORDER — SODIUM CHLORIDE 0.9 % IV SOLN
20.0000 mg | Freq: Once | INTRAVENOUS | Status: AC
  Administered 2022-04-26: 20 mg via INTRAVENOUS
  Filled 2022-04-26: qty 20

## 2022-04-26 MED ORDER — ZOLEDRONIC ACID 4 MG/100ML IV SOLN
4.0000 mg | Freq: Once | INTRAVENOUS | Status: AC
  Administered 2022-04-26: 4 mg via INTRAVENOUS
  Filled 2022-04-26: qty 100

## 2022-04-26 MED ORDER — LACTULOSE 20 GM/30ML PO SOLN: 20.0000 mL | Freq: Three times a day (TID) | ORAL | 1 refills | Status: DC | PRN

## 2022-04-26 NOTE — Progress Notes (Signed)
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 '  : I will out of neck pain  HPI: Barry Gray is a very nice 66 year old white male.  He comes in with his wife.  He is originally from [place].  He is related to one of our other patients.  He was referred to [place] by Dr. Phylliss Bob of Orthopedic Surgery.  Dr. Lynann Bologna has been seeing him recently.  He was found to have pain in his neck.  He has been having this for a little bit.  I think going back to November he began to note some pain.  There is some pain that was over in his left shoulder.  This seemed to get better.  He then had it in his right shoulder.  He ultimately underwent a MRI.  This was done on 04/02/2022.  This showed a burst fracture at C7 with near complete loss of height.  It was felt that this may have been pathologic.  He had a CT of the cervical spine.  This was done on 04/16/2022.  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  He had a CT of the body.  This was done on 04/16/2022.  This did not show any obvious lung lesions.  There was no obvious hepatic lesion.  There is no adenopathy.  However, he did have numerous lucent lesions involving the axial and proximal appendicular skeleton.  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  On 04/22/2022, he had lab work done.  Surprising, his calcium was 10.1.  His blood sugar was high at 241.  His white count is 11.1 with a hemoglobin of 15.3.  He has lost some weight.  His appetite is not that great.  He has had some abdominal discomfort starting pain medication.  This might be some constipation.  I told that he really needs to have the surgery for his cervical spine.  He is scheduled for this on  04/28/2022.  He has little bit of weakness over on the right arm and shoulder.  He has had no cough.  Has had no bleeding.  He has had no leg swelling.  He says he has a hard time walking because of some weakness.  He has had no problems with his urine.  He has had no obvious incontinence.  He says his bowels tend to be a little bit difficult.  He used to smoke.  I think he stopped 35 years ago.  He does not drink.  There is no obvious occupational exposures.  There is no obvious family history of of any type of blood problem.  Currently, I would say his performance status is probably ECOG 2.    Past Medical History:  Diagnosis Date   Diabetes mellitus    Fracture of fibula, distal 10/2011   w/ mild posterior displacement of distal fracture fragments   Kidney stone   :   Past Surgical History:  Procedure Laterality Date   COLONOSCOPY    :   Current Outpatient Medications:    Cholecalciferol (VITAMIN D3 PO), Take 1 tablet by mouth daily., Disp: , Rfl:    Cyanocobalamin (B-12 PO), Take 1 tablet by mouth daily., Disp: , Rfl:  Lactulose 20 GM/30ML SOLN, Take 20 mLs (13.3333 g total) by mouth every 8 (eight) hours as needed., Disp: 450 mL, Rfl: 1   metFORMIN (GLUCOPHAGE) 500 MG tablet, Take 500 mg by mouth 2 (two) times daily with a meal. , Disp: , Rfl:    oxyCODONE-acetaminophen (PERCOCET) 10-325 MG tablet, Take 1 tablet by mouth every 4 (four) hours as needed for pain., Disp: , Rfl:    polyethylene glycol (MIRALAX / GLYCOLAX) 17 g packet, Take 17 g by mouth daily as needed for moderate constipation., Disp: , Rfl:    lisinopril (ZESTRIL) 10 MG tablet, Take 10 mg by mouth daily as needed (blood pressure of 160/85). (Patient not taking: Reported on 04/26/2022), Disp: , Rfl: :  :  No Known Allergies:  History reviewed. No pertinent family history.:   Social History   Socioeconomic History   Marital status: Married    Spouse name: Not on file   Number of children: Not on  file   Years of education: Not on file   Highest education level: Not on file  Occupational History   Not on file  Tobacco Use   Smoking status: Former    Packs/day: 1.00    Years: 15.00    Total pack years: 15.00    Types: Cigarettes    Quit date: 40    Years since quitting: 33.4   Smokeless tobacco: Never  Vaping Use   Vaping Use: Never used  Substance and Sexual Activity   Alcohol use: No   Drug use: No   Sexual activity: Not on file  Other Topics Concern   Not on file  Social History Narrative   Not on file   Social Determinants of Health   Financial Resource Strain: Not on file  Food Insecurity: Not on file  Transportation Needs: Not on file  Physical Activity: Not on file  Stress: Not on file  Social Connections: Not on file  Intimate Partner Violence: Not on file  :  Review of Systems  Constitutional:  Positive for malaise/fatigue and weight loss.  HENT: Negative.    Eyes: Negative.   Respiratory: Negative.    Cardiovascular: Negative.   Gastrointestinal: Negative.   Genitourinary: Negative.   Musculoskeletal:  Positive for neck pain.  Skin: Negative.   Neurological:  Positive for weakness.  Endo/Heme/Allergies: Negative.   Psychiatric/Behavioral: Negative.       Exam: '@IPVITALS'$ @ Physical Exam Vitals reviewed.  HENT:     Head: Normocephalic and atraumatic.  Eyes:     Pupils: Pupils are equal, round, and reactive to light.  Cardiovascular:     Rate and Rhythm: Normal rate and regular rhythm.     Heart sounds: Normal heart sounds.  Pulmonary:     Effort: Pulmonary effort is normal.     Breath sounds: Normal breath sounds.  Abdominal:     General: Bowel sounds are normal.     Palpations: Abdomen is soft.  Musculoskeletal:        General: No tenderness or deformity. Normal range of motion.     Cervical back: Normal range of motion.     Comments: Musculoskeletal exam shows some weakness in the right arm.  His left arm seems to be doing okay  with a decent range of motion.  He may have little bit of weakness in his legs bilaterally.  He has good sensation.  Lymphadenopathy:     Cervical: No cervical adenopathy.  Skin:    General: Skin is warm and dry.  Findings: No erythema or rash.  Neurological:     Mental Status: He is alert and oriented to person, place, and time.  Psychiatric:        Behavior: Behavior normal.        Thought Content: Thought content normal.        Judgment: Judgment normal.     Recent Labs    04/26/22 1033  WBC 10.5  HGB 14.8  HCT 43.6  PLT 368    Recent Labs    04/26/22 1033  NA 137  K 4.1  CL 97*  CO2 26  GLUCOSE 175*  BUN 17  CREATININE 1.32*  CALCIUM 10.8*    Blood smear review: None  Pathology: Pending    Assessment and Plan: Mr. Hudspeth is a very nice 66 year old white male.  He comes in with his wife.  He has what looks to be a pathologic fracture at C7.  He will need surgery for this.  We get pathology from the surgery.  I think this will be very very helpful for [place].  However, I am worried that he may have some cord involvement.  I will give him some Decadron in the office today.  I realize that this will affect his blood sugars.  However, I think we got some swelling out of his spinal cord, this would certainly help him.  I will also give him some Zometa.  I would like to get a bone survey on him.  Am surprised that he really is not anemic.  I was surprised that his protein and albumin are okay.  I would think that if this was light chain myeloma, that his protein and albumin would be on the low side.  Again, we are going to have to await the pathology on his C7 fracture.  Maybe, this might be some type of non-Hodgkin's lymphoma.  Bone lymphoma could certainly do this.  We will have to see what his myeloma studies look like.  He is very very nice.    We will likely see him back once we get the results back from all of our studies.  He is going to do a 24-hour  urine for [place] also.

## 2022-04-26 NOTE — Addendum Note (Signed)
Addended by: Burney Gauze R on: 04/26/2022 04:02 PM   Modules accepted: Orders

## 2022-04-27 ENCOUNTER — Encounter: Payer: Self-pay | Admitting: *Deleted

## 2022-04-27 ENCOUNTER — Telehealth: Payer: Self-pay

## 2022-04-27 ENCOUNTER — Other Ambulatory Visit (HOSPITAL_BASED_OUTPATIENT_CLINIC_OR_DEPARTMENT_OTHER): Payer: Self-pay

## 2022-04-27 LAB — IGG, IGA, IGM
IgA: 590 mg/dL — ABNORMAL HIGH (ref 61–437)
IgG (Immunoglobin G), Serum: 433 mg/dL — ABNORMAL LOW (ref 603–1613)
IgM (Immunoglobulin M), Srm: 24 mg/dL (ref 20–172)

## 2022-04-27 LAB — KAPPA/LAMBDA LIGHT CHAINS
Kappa free light chain: 512.4 mg/L — ABNORMAL HIGH (ref 3.3–19.4)
Kappa, lambda light chain ratio: 49.75 — ABNORMAL HIGH (ref 0.26–1.65)
Lambda free light chains: 10.3 mg/L (ref 5.7–26.3)

## 2022-04-27 NOTE — Telephone Encounter (Signed)
-----   Message from Volanda Napoleon, MD sent at 04/27/2022  2:19 PM EDT ----- Call and let him know that the bone survey that he had does show that there are areas in his bones that certainly could represent myeloma.

## 2022-04-27 NOTE — Telephone Encounter (Signed)
Called and informed patient of results, patient and wife verbalized understanding and denies any questions or concerns at this time.

## 2022-04-27 NOTE — Progress Notes (Signed)
This navigator was out of the office for new patient appointment.   Initial RN Navigator Patient Visit  Name: Barry Gray Date of Referral : 04/20/2022 Diagnosis: Bony Lesions ? Myeloma  Patient seen by Dr Marin Olp. He is scheduled for surgery on 04/28/2022. A biopsy will be obtained at that time. Multiple Myeloma panel sent and pending at this time.   Follow up will depend on myeloma panel and biopsy results. Will continue to follow.   Oncology Nurse Navigator Documentation     04/27/2022    8:45 AM  Oncology Nurse Navigator Flowsheets  Navigator Follow Up Date: 04/28/2022  Navigator Follow Up Reason: Surgery  Navigator Location [facility]  Navigator Encounter Type Appt/Treatment Plan Review  Patient Visit Type MedOnc  Treatment Phase Abnormal Scans  Barriers/Navigation Needs Coordination of Care;Education  Interventions None Required  Acuity Level 2-Minimal Needs (1-2 Barriers Identified)  Support Groups/Services Friends and Family  Time Spent with Patient 15

## 2022-04-28 ENCOUNTER — Encounter (HOSPITAL_COMMUNITY)
Admission: RE | Disposition: A | Discharge status: Home / Self Care | Payer: Self-pay | Source: Home / Self Care | Attending: Orthopedic Surgery

## 2022-04-28 ENCOUNTER — Other Ambulatory Visit: Payer: Self-pay

## 2022-04-28 ENCOUNTER — Other Ambulatory Visit (HOSPITAL_COMMUNITY): Payer: Medicare Other

## 2022-04-28 ENCOUNTER — Inpatient Hospital Stay (HOSPITAL_COMMUNITY): Payer: Medicare Other

## 2022-04-28 ENCOUNTER — Inpatient Hospital Stay (HOSPITAL_BASED_OUTPATIENT_CLINIC_OR_DEPARTMENT_OTHER): Payer: Medicare Other | Admitting: Certified Registered Nurse Anesthetist

## 2022-04-28 ENCOUNTER — Encounter (HOSPITAL_COMMUNITY): Payer: Self-pay | Admitting: Orthopedic Surgery

## 2022-04-28 ENCOUNTER — Inpatient Hospital Stay (HOSPITAL_COMMUNITY)
Admission: RE | Admit: 2022-04-28 | Discharge: 2022-04-29 | DRG: 454 | Disposition: A | Discharge status: Home / Self Care | Payer: Medicare Other | Attending: Orthopedic Surgery | Admitting: Orthopedic Surgery

## 2022-04-28 ENCOUNTER — Inpatient Hospital Stay (HOSPITAL_COMMUNITY): Payer: Medicare Other | Admitting: Certified Registered Nurse Anesthetist

## 2022-04-28 DIAGNOSIS — M810 Age-related osteoporosis without current pathological fracture: Secondary | ICD-10-CM | POA: Diagnosis present

## 2022-04-28 DIAGNOSIS — M2578 Osteophyte, vertebrae: Secondary | ICD-10-CM | POA: Diagnosis not present

## 2022-04-28 DIAGNOSIS — Z01818 Encounter for other preprocedural examination: Secondary | ICD-10-CM

## 2022-04-28 DIAGNOSIS — C9000 Multiple myeloma not having achieved remission: Secondary | ICD-10-CM | POA: Diagnosis present

## 2022-04-28 DIAGNOSIS — S12600A Unspecified displaced fracture of seventh cervical vertebra, initial encounter for closed fracture: Secondary | ICD-10-CM | POA: Diagnosis not present

## 2022-04-28 DIAGNOSIS — Z7984 Long term (current) use of oral hypoglycemic drugs: Secondary | ICD-10-CM

## 2022-04-28 DIAGNOSIS — G959 Disease of spinal cord, unspecified: Secondary | ICD-10-CM | POA: Diagnosis not present

## 2022-04-28 DIAGNOSIS — M50022 Cervical disc disorder at C5-C6 level with myelopathy: Principal | ICD-10-CM | POA: Diagnosis present

## 2022-04-28 DIAGNOSIS — N400 Enlarged prostate without lower urinary tract symptoms: Secondary | ICD-10-CM | POA: Diagnosis present

## 2022-04-28 DIAGNOSIS — Z79899 Other long term (current) drug therapy: Secondary | ICD-10-CM

## 2022-04-28 DIAGNOSIS — S12690A Other displaced fracture of seventh cervical vertebra, initial encounter for closed fracture: Secondary | ICD-10-CM | POA: Diagnosis present

## 2022-04-28 DIAGNOSIS — G9520 Unspecified cord compression: Secondary | ICD-10-CM

## 2022-04-28 DIAGNOSIS — Z87891 Personal history of nicotine dependence: Secondary | ICD-10-CM

## 2022-04-28 DIAGNOSIS — E119 Type 2 diabetes mellitus without complications: Secondary | ICD-10-CM | POA: Diagnosis present

## 2022-04-28 DIAGNOSIS — X58XXXA Exposure to other specified factors, initial encounter: Secondary | ICD-10-CM | POA: Diagnosis present

## 2022-04-28 DIAGNOSIS — M5412 Radiculopathy, cervical region: Secondary | ICD-10-CM | POA: Diagnosis present

## 2022-04-28 HISTORY — PX: ANTERIOR CERVICAL DECOMPRESSION/DISCECTOMY FUSION 4 LEVELS: SHX5556

## 2022-04-28 HISTORY — PX: POSTERIOR CERVICAL FUSION/FORAMINOTOMY: SHX5038

## 2022-04-28 LAB — POCT I-STAT 7, (LYTES, BLD GAS, ICA,H+H)
Acid-base deficit: 1 mmol/L (ref 0.0–2.0)
Acid-base deficit: 5 mmol/L — ABNORMAL HIGH (ref 0.0–2.0)
Bicarbonate: 22.9 mmol/L (ref 20.0–28.0)
Bicarbonate: 20.7 mmol/L (ref 20.0–28.0)
Calcium, Ion: 1.03 mmol/L — ABNORMAL LOW (ref 1.15–1.40)
Calcium, Ion: 1.03 mmol/L — ABNORMAL LOW (ref 1.15–1.40)
HCT: 29 % — ABNORMAL LOW (ref 39.0–52.0)
HCT: 26 % — ABNORMAL LOW (ref 39.0–52.0)
Hemoglobin: 9.9 g/dL — ABNORMAL LOW (ref 13.0–17.0)
Hemoglobin: 8.8 g/dL — ABNORMAL LOW (ref 13.0–17.0)
O2 Saturation: 100 %
O2 Saturation: 99 %
Patient temperature: 36.6
Potassium: 4.5 mmol/L (ref 3.5–5.1)
Potassium: 3.9 mmol/L (ref 3.5–5.1)
Sodium: 137 mmol/L (ref 135–145)
Sodium: 137 mmol/L (ref 135–145)
TCO2: 24 mmol/L (ref 22–32)
TCO2: 22 mmol/L (ref 22–32)
pCO2 arterial: 36.1 mmHg (ref 32–48)
pCO2 arterial: 37.2 mmHg (ref 32–48)
pH, Arterial: 7.41 (ref 7.35–7.45)
pH, Arterial: 7.35 (ref 7.35–7.45)
pO2, Arterial: 210 mmHg — ABNORMAL HIGH (ref 83–108)
pO2, Arterial: 158 mmHg — ABNORMAL HIGH (ref 83–108)

## 2022-04-28 LAB — GLUCOSE, CAPILLARY
Glucose-Capillary: 184 mg/dL — ABNORMAL HIGH (ref 70–99)
Glucose-Capillary: 141 mg/dL — ABNORMAL HIGH (ref 70–99)
Glucose-Capillary: 184 mg/dL — ABNORMAL HIGH (ref 70–99)
Glucose-Capillary: 128 mg/dL — ABNORMAL HIGH (ref 70–99)
Glucose-Capillary: 165 mg/dL — ABNORMAL HIGH (ref 70–99)
Glucose-Capillary: 337 mg/dL — ABNORMAL HIGH (ref 70–99)

## 2022-04-28 LAB — POCT ACTIVATED CLOTTING TIME: Activated Clotting Time: 131 seconds

## 2022-04-28 LAB — PREPARE RBC (CROSSMATCH)

## 2022-04-28 LAB — ABO/RH: ABO/RH(D): A POS

## 2022-04-28 SURGERY — ANTERIOR CERVICAL DECOMPRESSION/DISCECTOMY FUSION 4 LEVELS
Anesthesia: General | Site: Neck

## 2022-04-28 MED ORDER — PANTOPRAZOLE SODIUM 40 MG IV SOLR
40.0000 mg | Freq: Every day | INTRAVENOUS | Status: DC
  Administered 2022-04-28: 40 mg via INTRAVENOUS
  Filled 2022-04-28: qty 10

## 2022-04-28 MED ORDER — PHENYLEPHRINE HCL-NACL 20-0.9 MG/250ML-% IV SOLN
INTRAVENOUS | Status: DC | PRN
  Administered 2022-04-28: 25 ug/min via INTRAVENOUS
  Administered 2022-04-28: 150 ug/min via INTRAVENOUS

## 2022-04-28 MED ORDER — OXYCODONE HCL 5 MG PO TABS: 5.0000 mg | ORAL_TABLET | Freq: Once | ORAL | Status: DC | PRN

## 2022-04-28 MED ORDER — BUPIVACAINE-EPINEPHRINE (PF) 0.25% -1:200000 IJ SOLN
INTRAMUSCULAR | Status: AC
  Filled 2022-04-28: qty 30

## 2022-04-28 MED ORDER — BUPIVACAINE-EPINEPHRINE 0.25% -1:200000 IJ SOLN
INTRAMUSCULAR | Status: DC | PRN
  Administered 2022-04-28: 20 mL
  Administered 2022-04-28: 9 mL

## 2022-04-28 MED ORDER — PROPOFOL 10 MG/ML IV BOLUS
INTRAVENOUS | Status: AC
  Filled 2022-04-28: qty 20

## 2022-04-28 MED ORDER — POVIDONE-IODINE 7.5 % EX SOLN: Freq: Once | CUTANEOUS | Status: DC

## 2022-04-28 MED ORDER — METFORMIN HCL 500 MG PO TABS
500.0000 mg | ORAL_TABLET | Freq: Two times a day (BID) | ORAL | Status: DC
  Administered 2022-04-29: 500 mg via ORAL
  Filled 2022-04-28: qty 1

## 2022-04-28 MED ORDER — MENTHOL 3 MG MT LOZG: 1.0000 | LOZENGE | OROMUCOSAL | Status: DC | PRN

## 2022-04-28 MED ORDER — THROMBIN 20000 UNITS EX SOLR
CUTANEOUS | Status: AC
  Filled 2022-04-28: qty 20000

## 2022-04-28 MED ORDER — LACTULOSE ENCEPHALOPATHY 10 GM/15ML PO SOLN: 20.0000 g | Freq: Every day | ORAL | Status: DC | PRN

## 2022-04-28 MED ORDER — DOCUSATE SODIUM 100 MG PO CAPS
100.0000 mg | ORAL_CAPSULE | Freq: Two times a day (BID) | ORAL | Status: DC
  Administered 2022-04-28 – 2022-04-29 (×2): 100 mg via ORAL
  Filled 2022-04-28 (×2): qty 1

## 2022-04-28 MED ORDER — LIDOCAINE 2% (20 MG/ML) 5 ML SYRINGE
INTRAMUSCULAR | Status: AC
  Filled 2022-04-28: qty 5

## 2022-04-28 MED ORDER — ONDANSETRON HCL 4 MG/2ML IJ SOLN: 4.0000 mg | Freq: Once | INTRAMUSCULAR | Status: DC | PRN

## 2022-04-28 MED ORDER — POLYETHYLENE GLYCOL 3350 17 G PO PACK: 17.0000 g | PACK | Freq: Every day | ORAL | Status: DC | PRN

## 2022-04-28 MED ORDER — DEXAMETHASONE SODIUM PHOSPHATE 10 MG/ML IJ SOLN
INTRAMUSCULAR | Status: DC | PRN
  Administered 2022-04-28: 10 mg via INTRAVENOUS

## 2022-04-28 MED ORDER — SUGAMMADEX SODIUM 200 MG/2ML IV SOLN
INTRAVENOUS | Status: DC | PRN
  Administered 2022-04-28: 200 mg via INTRAVENOUS

## 2022-04-28 MED ORDER — CEFAZOLIN SODIUM-DEXTROSE 2-4 GM/100ML-% IV SOLN
2.0000 g | Freq: Three times a day (TID) | INTRAVENOUS | Status: AC
  Administered 2022-04-28 – 2022-04-29 (×2): 2 g via INTRAVENOUS
  Filled 2022-04-28 (×2): qty 100

## 2022-04-28 MED ORDER — SODIUM CHLORIDE 0.9% FLUSH: 3.0000 mL | INTRAVENOUS | Status: DC | PRN

## 2022-04-28 MED ORDER — INSULIN ASPART 100 UNIT/ML IJ SOLN
0.0000 [IU] | Freq: Every day | INTRAMUSCULAR | Status: DC
  Administered 2022-04-28: 4 [IU] via SUBCUTANEOUS

## 2022-04-28 MED ORDER — SODIUM CHLORIDE 0.9% FLUSH: 3.0000 mL | Freq: Two times a day (BID) | INTRAVENOUS | Status: DC

## 2022-04-28 MED ORDER — HYDROMORPHONE HCL 1 MG/ML IJ SOLN
INTRAMUSCULAR | Status: AC
  Filled 2022-04-28: qty 1

## 2022-04-28 MED ORDER — LACTULOSE 10 GM/15ML PO SOLN
15.0000 g | Freq: Three times a day (TID) | ORAL | Status: DC | PRN
  Filled 2022-04-28: qty 30

## 2022-04-28 MED ORDER — DEXTROSE 50 % IV SOLN: 0.0000 mL | INTRAVENOUS | Status: DC | PRN

## 2022-04-28 MED ORDER — THROMBIN 20000 UNITS EX SOLR
CUTANEOUS | Status: DC | PRN
  Administered 2022-04-28: 20000 [IU] via TOPICAL

## 2022-04-28 MED ORDER — ONDANSETRON HCL 4 MG/2ML IJ SOLN
INTRAMUSCULAR | Status: DC | PRN
  Administered 2022-04-28: 4 mg via INTRAVENOUS

## 2022-04-28 MED ORDER — MORPHINE SULFATE (PF) 2 MG/ML IV SOLN
1.0000 mg | INTRAVENOUS | Status: DC | PRN
  Administered 2022-04-28 – 2022-04-29 (×2): 2 mg via INTRAVENOUS
  Filled 2022-04-28 (×2): qty 1

## 2022-04-28 MED ORDER — INSULIN REGULAR(HUMAN) IN NACL 100-0.9 UT/100ML-% IV SOLN
INTRAVENOUS | Status: DC
  Administered 2022-04-28: 5 [IU]/h via INTRAVENOUS
  Filled 2022-04-28: qty 100

## 2022-04-28 MED ORDER — PHENOL 1.4 % MT LIQD
1.0000 | OROMUCOSAL | Status: DC | PRN
  Administered 2022-04-29: 1 via OROMUCOSAL
  Filled 2022-04-28: qty 177

## 2022-04-28 MED ORDER — CEFAZOLIN SODIUM-DEXTROSE 2-4 GM/100ML-% IV SOLN
INTRAVENOUS | Status: AC
Start: 2022-04-28 — End: ?
  Filled 2022-04-28: qty 100

## 2022-04-28 MED ORDER — HYDROCODONE-ACETAMINOPHEN 5-325 MG PO TABS: 1.0000 | ORAL_TABLET | ORAL | Status: DC | PRN

## 2022-04-28 MED ORDER — BUPIVACAINE LIPOSOME 1.3 % IJ SUSP
INTRAMUSCULAR | Status: AC
  Filled 2022-04-28: qty 20

## 2022-04-28 MED ORDER — BISACODYL 5 MG PO TBEC: 5.0000 mg | DELAYED_RELEASE_TABLET | Freq: Every day | ORAL | Status: DC | PRN

## 2022-04-28 MED ORDER — PROPOFOL 10 MG/ML IV BOLUS
INTRAVENOUS | Status: DC | PRN
  Administered 2022-04-28: 30 mg via INTRAVENOUS
  Administered 2022-04-28: 150 mg via INTRAVENOUS

## 2022-04-28 MED ORDER — DEXAMETHASONE SODIUM PHOSPHATE 10 MG/ML IJ SOLN
INTRAMUSCULAR | Status: AC
  Filled 2022-04-28: qty 1

## 2022-04-28 MED ORDER — FLEET ENEMA 7-19 GM/118ML RE ENEM
1.0000 | ENEMA | Freq: Once | RECTAL | Status: DC | PRN
Start: 2022-04-28 — End: 2022-04-29

## 2022-04-28 MED ORDER — ONDANSETRON HCL 4 MG/2ML IJ SOLN
INTRAMUSCULAR | Status: AC
Start: 2022-04-28 — End: ?
  Filled 2022-04-28: qty 2

## 2022-04-28 MED ORDER — ONDANSETRON HCL 4 MG PO TABS: 4.0000 mg | ORAL_TABLET | Freq: Four times a day (QID) | ORAL | Status: DC | PRN

## 2022-04-28 MED ORDER — ROCURONIUM BROMIDE 10 MG/ML (PF) SYRINGE
PREFILLED_SYRINGE | INTRAVENOUS | Status: AC
  Filled 2022-04-28: qty 20

## 2022-04-28 MED ORDER — OXYCODONE-ACETAMINOPHEN 5-325 MG PO TABS
1.0000 | ORAL_TABLET | ORAL | Status: DC | PRN
  Administered 2022-04-28 – 2022-04-29 (×4): 2 via ORAL
  Filled 2022-04-28 (×4): qty 2

## 2022-04-28 MED ORDER — ALUM & MAG HYDROXIDE-SIMETH 200-200-20 MG/5ML PO SUSP
30.0000 mL | Freq: Four times a day (QID) | ORAL | Status: DC | PRN
Start: 2022-04-28 — End: 2022-04-29

## 2022-04-28 MED ORDER — BUPIVACAINE LIPOSOME 1.3 % IJ SUSP
INTRAMUSCULAR | Status: DC | PRN
  Administered 2022-04-28: 20 mL

## 2022-04-28 MED ORDER — MIDAZOLAM HCL 2 MG/2ML IJ SOLN
INTRAMUSCULAR | Status: DC | PRN
  Administered 2022-04-28: 2 mg via INTRAVENOUS

## 2022-04-28 MED ORDER — FENTANYL CITRATE (PF) 250 MCG/5ML IJ SOLN
INTRAMUSCULAR | Status: AC
  Filled 2022-04-28: qty 5

## 2022-04-28 MED ORDER — ROCURONIUM BROMIDE 10 MG/ML (PF) SYRINGE
PREFILLED_SYRINGE | INTRAVENOUS | Status: DC | PRN
  Administered 2022-04-28: 100 mg via INTRAVENOUS
  Administered 2022-04-28 (×2): 50 mg via INTRAVENOUS
  Administered 2022-04-28: 20 mg via INTRAVENOUS
  Administered 2022-04-28: 50 mg via INTRAVENOUS

## 2022-04-28 MED ORDER — FENTANYL CITRATE (PF) 250 MCG/5ML IJ SOLN
INTRAMUSCULAR | Status: DC | PRN
  Administered 2022-04-28 (×3): 50 ug via INTRAVENOUS

## 2022-04-28 MED ORDER — CHLORHEXIDINE GLUCONATE 0.12 % MT SOLN
15.0000 mL | Freq: Once | OROMUCOSAL | Status: AC
  Administered 2022-04-28: 15 mL via OROMUCOSAL
  Filled 2022-04-28: qty 15

## 2022-04-28 MED ORDER — 0.9 % SODIUM CHLORIDE (POUR BTL) OPTIME
TOPICAL | Status: DC | PRN
  Administered 2022-04-28: 1000 mL

## 2022-04-28 MED ORDER — ROCURONIUM BROMIDE 10 MG/ML (PF) SYRINGE
PREFILLED_SYRINGE | INTRAVENOUS | Status: AC
  Filled 2022-04-28: qty 10

## 2022-04-28 MED ORDER — OXYCODONE HCL 5 MG/5ML PO SOLN: 5.0000 mg | Freq: Once | ORAL | Status: DC | PRN

## 2022-04-28 MED ORDER — MIDAZOLAM HCL 2 MG/2ML IJ SOLN
INTRAMUSCULAR | Status: AC
Start: 2022-04-28 — End: ?
  Filled 2022-04-28: qty 2

## 2022-04-28 MED ORDER — TAMSULOSIN HCL 0.4 MG PO CAPS
0.4000 mg | ORAL_CAPSULE | Freq: Every day | ORAL | Status: DC
  Administered 2022-04-28 – 2022-04-29 (×2): 0.4 mg via ORAL
  Filled 2022-04-28 (×2): qty 1

## 2022-04-28 MED ORDER — ALBUMIN HUMAN 5 % IV SOLN: INTRAVENOUS | Status: DC | PRN

## 2022-04-28 MED ORDER — HYDROMORPHONE HCL 1 MG/ML IJ SOLN
0.2500 mg | INTRAMUSCULAR | Status: DC | PRN
  Administered 2022-04-28: 0.5 mg via INTRAVENOUS
  Administered 2022-04-28 (×2): 0.25 mg via INTRAVENOUS

## 2022-04-28 MED ORDER — LACTATED RINGERS IV SOLN: INTRAVENOUS | Status: DC

## 2022-04-28 MED ORDER — ACETAMINOPHEN 500 MG PO TABS
1000.0000 mg | ORAL_TABLET | Freq: Once | ORAL | Status: AC
  Administered 2022-04-28: 1000 mg via ORAL
  Filled 2022-04-28: qty 2

## 2022-04-28 MED ORDER — ACETAMINOPHEN 325 MG PO TABS: 650.0000 mg | ORAL_TABLET | ORAL | Status: DC | PRN

## 2022-04-28 MED ORDER — INSULIN ASPART 100 UNIT/ML IJ SOLN
0.0000 [IU] | Freq: Three times a day (TID) | INTRAMUSCULAR | Status: DC
  Administered 2022-04-29: 5 [IU] via SUBCUTANEOUS
  Administered 2022-04-29: 8 [IU] via SUBCUTANEOUS

## 2022-04-28 MED ORDER — PHENYLEPHRINE 80 MCG/ML (10ML) SYRINGE FOR IV PUSH (FOR BLOOD PRESSURE SUPPORT)
PREFILLED_SYRINGE | INTRAVENOUS | Status: DC | PRN
  Administered 2022-04-28 (×22): 80 ug via INTRAVENOUS

## 2022-04-28 MED ORDER — INSULIN ASPART 100 UNIT/ML IJ SOLN
0.0000 [IU] | INTRAMUSCULAR | Status: DC | PRN
  Administered 2022-04-28: 2 [IU] via SUBCUTANEOUS
  Filled 2022-04-28: qty 1

## 2022-04-28 MED ORDER — METHOCARBAMOL 1000 MG/10ML IJ SOLN: 500.0000 mg | Freq: Four times a day (QID) | INTRAVENOUS | Status: DC | PRN

## 2022-04-28 MED ORDER — BACITRACIN ZINC 500 UNIT/GM EX OINT
TOPICAL_OINTMENT | CUTANEOUS | Status: AC
  Filled 2022-04-28: qty 28.35

## 2022-04-28 MED ORDER — THROMBIN 20000 UNITS EX SOLR
CUTANEOUS | Status: AC
Start: 2022-04-28 — End: ?
  Filled 2022-04-28: qty 20000

## 2022-04-28 MED ORDER — LIDOCAINE 2% (20 MG/ML) 5 ML SYRINGE
INTRAMUSCULAR | Status: DC | PRN
  Administered 2022-04-28: 60 mg via INTRAVENOUS

## 2022-04-28 MED ORDER — SENNOSIDES-DOCUSATE SODIUM 8.6-50 MG PO TABS
1.0000 | ORAL_TABLET | Freq: Every evening | ORAL | Status: DC | PRN
Start: 2022-04-28 — End: 2022-04-29

## 2022-04-28 MED ORDER — SODIUM CHLORIDE 0.9 % IV SOLN: 250.0000 mL | INTRAVENOUS | Status: DC

## 2022-04-28 MED ORDER — LACTATED RINGERS IV SOLN: INTRAVENOUS | Status: DC | PRN

## 2022-04-28 MED ORDER — ONDANSETRON HCL 4 MG/2ML IJ SOLN
4.0000 mg | Freq: Four times a day (QID) | INTRAMUSCULAR | Status: DC | PRN
Start: 2022-04-28 — End: 2022-04-29

## 2022-04-28 MED ORDER — CEFAZOLIN SODIUM-DEXTROSE 2-4 GM/100ML-% IV SOLN
2.0000 g | INTRAVENOUS | Status: AC
  Administered 2022-04-28: 2 g via INTRAVENOUS
  Filled 2022-04-28: qty 100

## 2022-04-28 MED ORDER — METHOCARBAMOL 500 MG PO TABS
500.0000 mg | ORAL_TABLET | Freq: Four times a day (QID) | ORAL | Status: DC | PRN
  Administered 2022-04-28 – 2022-04-29 (×3): 500 mg via ORAL
  Filled 2022-04-28 (×3): qty 1

## 2022-04-28 MED ORDER — ORAL CARE MOUTH RINSE: 15.0000 mL | Freq: Once | OROMUCOSAL | Status: AC

## 2022-04-28 MED ORDER — ACETAMINOPHEN 650 MG RE SUPP: 650.0000 mg | RECTAL | Status: DC | PRN

## 2022-04-28 MED ORDER — VITAMIN D 25 MCG (1000 UNIT) PO TABS
1000.0000 [IU] | ORAL_TABLET | Freq: Every day | ORAL | Status: DC
  Administered 2022-04-28 – 2022-04-29 (×2): 1000 [IU] via ORAL
  Filled 2022-04-28 (×2): qty 1

## 2022-04-28 MED ORDER — AMISULPRIDE (ANTIEMETIC) 5 MG/2ML IV SOLN: 10.0000 mg | Freq: Once | INTRAVENOUS | Status: DC | PRN

## 2022-04-28 SURGICAL SUPPLY — 98 items
AGENT HMST KT MTR STRL THRMB (HEMOSTASIS) ×2
APL SKNCLS STERI-STRIP NONHPOA (GAUZE/BANDAGES/DRESSINGS) ×2
BAG COUNTER SPONGE SURGICOUNT (BAG) ×3 IMPLANT
BAG SPNG CNTER NS LX DISP (BAG) ×2
BENZOIN TINCTURE PRP APPL 2/3 (GAUZE/BANDAGES/DRESSINGS) ×3 IMPLANT
BIT DRILL NEURO 2X3.1 SFT TUCH (MISCELLANEOUS) ×2 IMPLANT
BIT DRILL SRG 14X2.2XFLT CHK (BIT) IMPLANT
BIT DRL SRG 14X2.2XFLT CHK (BIT) ×2
BLADE CLIPPER SURG (BLADE) ×4 IMPLANT
BLADE SURG 15 STRL LF DISP TIS (BLADE) ×2 IMPLANT
BLADE SURG 15 STRL SS (BLADE) ×6
BONE VIVIGEN FORMABLE 5.4CC (Bone Implant) ×3 IMPLANT
BUR MATCHSTICK NEURO 3.0 LAGG (BURR) ×1 IMPLANT
BUR PRESCISION 1.7 ELITE (BURR) ×1 IMPLANT
CARTRIDGE OIL MAESTRO DRILL (MISCELLANEOUS) ×2 IMPLANT
COVER SURGICAL LIGHT HANDLE (MISCELLANEOUS) ×4 IMPLANT
DIFFUSER DRILL AIR PNEUMATIC (MISCELLANEOUS) ×3 IMPLANT
DRAIN JACKSON RD 7FR 3/32 (WOUND CARE) ×1 IMPLANT
DRAPE C-ARM 42X72 X-RAY (DRAPES) ×6 IMPLANT
DRAPE POUCH INSTRU U-SHP 10X18 (DRAPES) ×4 IMPLANT
DRAPE SURG 17X23 STRL (DRAPES) ×10 IMPLANT
DRILL BIT SKYLINE 14MM (BIT) ×3
DRILL NEURO 2X3.1 SOFT TOUCH (MISCELLANEOUS) ×3
DURAPREP 26ML APPLICATOR (WOUND CARE) ×4 IMPLANT
ELECT COATED BLADE 2.86 ST (ELECTRODE) ×4 IMPLANT
ELECT REM PT RETURN 9FT ADLT (ELECTROSURGICAL) ×6
ELECTRODE REM PT RTRN 9FT ADLT (ELECTROSURGICAL) ×2 IMPLANT
EVACUATOR SILICONE 100CC (DRAIN) IMPLANT
GAUZE 4X4 16PLY ~~LOC~~+RFID DBL (SPONGE) ×3 IMPLANT
GAUZE SPONGE 4X4 12PLY STRL (GAUZE/BANDAGES/DRESSINGS) ×4 IMPLANT
GLOVE BIO SURGEON STRL SZ7 (GLOVE) ×3 IMPLANT
GLOVE BIO SURGEON STRL SZ8 (GLOVE) ×5 IMPLANT
GLOVE BIOGEL PI IND STRL 7.0 (GLOVE) ×4 IMPLANT
GLOVE BIOGEL PI IND STRL 8 (GLOVE) ×2 IMPLANT
GLOVE BIOGEL PI INDICATOR 7.0 (GLOVE) ×2
GLOVE BIOGEL PI INDICATOR 8 (GLOVE) ×3
GLOVE SURG ENC MOIS LTX SZ6.5 (GLOVE) ×3 IMPLANT
GOWN STRL REUS W/ TWL LRG LVL3 (GOWN DISPOSABLE) ×2 IMPLANT
GOWN STRL REUS W/ TWL XL LVL3 (GOWN DISPOSABLE) ×2 IMPLANT
GOWN STRL REUS W/TWL LRG LVL3 (GOWN DISPOSABLE) ×3
GOWN STRL REUS W/TWL XL LVL3 (GOWN DISPOSABLE) ×3
GRAFT BNE MATRIX VG FRMBL MD 5 (Bone Implant) IMPLANT
INTERLOCK LRDTC CRVCL VBR 7MM (Bone Implant) IMPLANT
IV CATH 14GX2 1/4 (CATHETERS) ×3 IMPLANT
KIT BASIN OR (CUSTOM PROCEDURE TRAY) ×4 IMPLANT
KIT TURNOVER KIT B (KITS) ×3 IMPLANT
LORDOTIC CERVICAL VBR 7MM SM (Bone Implant) ×3 IMPLANT
MANIFOLD NEPTUNE II (INSTRUMENTS) ×3 IMPLANT
NDL PRECISIONGLIDE 27X1.5 (NEEDLE) ×2 IMPLANT
NDL SPNL 20GX3.5 QUINCKE YW (NEEDLE) ×2 IMPLANT
NEEDLE PRECISIONGLIDE 27X1.5 (NEEDLE) ×3 IMPLANT
NEEDLE SPNL 20GX3.5 QUINCKE YW (NEEDLE) ×3 IMPLANT
NS IRRIG 1000ML POUR BTL (IV SOLUTION) ×3 IMPLANT
OIL CARTRIDGE MAESTRO DRILL (MISCELLANEOUS) ×3
PACK ORTHO CERVICAL (CUSTOM PROCEDURE TRAY) ×3 IMPLANT
PAD ARMBOARD 7.5X6 YLW CONV (MISCELLANEOUS) ×6 IMPLANT
PATTIES SURGICAL .5 X.5 (GAUZE/BANDAGES/DRESSINGS) IMPLANT
PATTIES SURGICAL .5 X1 (DISPOSABLE) IMPLANT
PIN DISTRACTION 14 (PIN) ×2 IMPLANT
PIN DISTRACTION 14MM (PIN) ×2 IMPLANT
PLATE SKYLINE 3 LVL 48MM (Plate) ×1 IMPLANT
POSITIONER HEAD DONUT 9IN (MISCELLANEOUS) ×3 IMPLANT
ROD PRELORD SYM 4X70 NS (Rod) ×2 IMPLANT
SCREW MASS LAT SYM 3.5X14 (Screw) ×4 IMPLANT
SCREW PLY CFX SYM 5X30 (Screw) ×4 IMPLANT
SCREW SET SPINAL (Screw) ×8 IMPLANT
SCREW SKYLINE VAR OS 14MM (Screw) ×4 IMPLANT
SCREW VAR SELF TAP SKYLINE 14M (Screw) ×2 IMPLANT
SHEET CONFORM 45LX20WX5H (Bone Implant) ×1 IMPLANT
SPACER CERV TI 12X14X12 0D (Spacer) IMPLANT
SPACER CERV TI FORT 18-23X12 (Spacer) ×1 IMPLANT
SPACER CORE FORTIFY 15X12 0D (Spacer) IMPLANT
SPACER LOWER FORTIFY 12X14 0D (Plate) ×2 IMPLANT
SPACER UPPER FORTIFY 12X14 3.5 (Plate) ×1 IMPLANT
SPIKE FLUID TRANSFER (MISCELLANEOUS) ×3 IMPLANT
SPONGE INTESTINAL PEANUT (DISPOSABLE) ×7 IMPLANT
SPONGE SURGIFOAM ABS GEL 100 (HEMOSTASIS) ×3 IMPLANT
STRIP CLOSURE SKIN 1/2X4 (GAUZE/BANDAGES/DRESSINGS) ×4 IMPLANT
SURGIFLO W/THROMBIN 8M KIT (HEMOSTASIS) ×1 IMPLANT
SUT MNCRL AB 4-0 PS2 18 (SUTURE) ×4 IMPLANT
SUT VIC AB 0 CT1 18XCR BRD 8 (SUTURE) IMPLANT
SUT VIC AB 0 CT1 8-18 (SUTURE) ×3
SUT VIC AB 1 CT1 18XCR BRD 8 (SUTURE) IMPLANT
SUT VIC AB 1 CT1 8-18 (SUTURE) ×3
SUT VIC AB 2-0 CP2 18 (SUTURE) ×1 IMPLANT
SUT VIC AB 2-0 CT2 18 VCP726D (SUTURE) ×3 IMPLANT
SYR BULB IRRIG 60ML STRL (SYRINGE) ×3 IMPLANT
SYR CONTROL 10ML LL (SYRINGE) ×6 IMPLANT
TAP SPINAL 3.0 (ORTHOPEDIC DISPOSABLE SUPPLIES) ×1 IMPLANT
TAP THREADED CFX 4.0 (ORTHOPEDIC DISPOSABLE SUPPLIES) ×1 IMPLANT
TAP THREADED CFX 4.5 (ORTHOPEDIC DISPOSABLE SUPPLIES) ×1 IMPLANT
TAPE CLOTH 4X10 WHT NS (GAUZE/BANDAGES/DRESSINGS) ×3 IMPLANT
TAPE UMBILICAL COTTON 1/8X30 (MISCELLANEOUS) ×3 IMPLANT
TOWEL GREEN STERILE (TOWEL DISPOSABLE) ×3 IMPLANT
TOWEL GREEN STERILE FF (TOWEL DISPOSABLE) ×3 IMPLANT
TRAY FOLEY MTR SLVR 16FR STAT (SET/KITS/TRAYS/PACK) ×3 IMPLANT
WATER STERILE IRR 1000ML POUR (IV SOLUTION) ×3 IMPLANT
YANKAUER SUCT BULB TIP NO VENT (SUCTIONS) ×3 IMPLANT

## 2022-04-28 NOTE — Anesthesia Procedure Notes (Signed)
Procedure Name: Intubation Date/Time: 04/28/2022 8:45 AM  Performed by: Michele Rockers, CRNAPre-anesthesia Checklist: Patient identified, Patient being monitored, Timeout performed, Emergency Drugs available and Suction available Patient Re-evaluated:Patient Re-evaluated prior to induction Oxygen Delivery Method: Circle System Utilized Preoxygenation: Pre-oxygenation with 100% oxygen Induction Type: IV induction Ventilation: Mask ventilation without difficulty and Oral airway inserted - appropriate to patient size Laryngoscope Size: Glidescope, Mac and 3 Grade View: Grade I Tube type: Oral Tube size: 8.0 mm Number of attempts: 1 Airway Equipment and Method: Stylet Placement Confirmation: ETT inserted through vocal cords under direct vision, positive ETCO2 and breath sounds checked- equal and bilateral Secured at: 21 cm Tube secured with: Tape Dental Injury: Teeth and Oropharynx as per pre-operative assessment  Difficulty Due To: Difficult Airway- due to reduced neck mobility and Difficult Airway-  due to neck instability

## 2022-04-28 NOTE — H&P (Signed)
PREOPERATIVE H&P  Chief Complaint: Neck pain, balance deterioration  HPI: Barry Gray is a 66 y.o. male who presents with ongoing pain in the neck and deterioration in balance for about the last 2 months  MRI reveals a lytic lesion throughout the C7 vertebral body and posterior elements, with a C7 burst fracture resulting in spinal cord compression  Patient has failed multiple forms of conservative care and continues to have pain (see office notes for additional details regarding the patient's full course of treatment)  Past Medical History:  Diagnosis Date   Diabetes mellitus    Fracture of fibula, distal 10/2011   w/ mild posterior displacement of distal fracture fragments   Kidney stone    Past Surgical History:  Procedure Laterality Date   COLONOSCOPY     Social History   Socioeconomic History   Marital status: Married    Spouse name: Not on file   Number of children: Not on file   Years of education: Not on file   Highest education level: Not on file  Occupational History   Not on file  Tobacco Use   Smoking status: Former    Packs/day: 1.00    Years: 15.00    Total pack years: 15.00    Types: Cigarettes    Quit date: 49    Years since quitting: 33.4   Smokeless tobacco: Never  Vaping Use   Vaping Use: Never used  Substance and Sexual Activity   Alcohol use: No   Drug use: No   Sexual activity: Not on file  Other Topics Concern   Not on file  Social History Narrative   Not on file   Social Determinants of Health   Financial Resource Strain: Not on file  Food Insecurity: Not on file  Transportation Needs: Not on file  Physical Activity: Not on file  Stress: Not on file  Social Connections: Not on file   History reviewed. No pertinent family history. No Known Allergies Prior to Admission medications   Medication Sig Start Date End Date Taking? Authorizing Provider  Cholecalciferol (VITAMIN D3 PO) Take 1 tablet by mouth daily.   Yes  [provider]  Cyanocobalamin (B-12 PO) Take 1 tablet by mouth daily.   Yes [provider]  Lactulose 20 GM/30ML SOLN Take 20 mLs (13.3333 g total) by mouth every 8 (eight) hours as needed. 04/26/22  Yes Ennever, Rudell Cobb, MD  lactulose, encephalopathy, (CHRONULAC) 10 GM/15ML SOLN Take 20 mls by mouth every 8 hours as needed 04/26/22  Yes Ennever, Rudell Cobb, MD  lisinopril (ZESTRIL) 10 MG tablet Take 10 mg by mouth daily as needed (blood pressure of 160/85). Patient not taking: Reported on 04/26/2022 04/16/22  Yes [provider]  metFORMIN (GLUCOPHAGE) 500 MG tablet Take 500 mg by mouth 2 (two) times daily with a meal.    Yes [provider]  oxyCODONE-acetaminophen (PERCOCET) 10-325 MG tablet Take 1 tablet by mouth every 4 (four) hours as needed for pain. 04/19/22  Yes [provider]  polyethylene glycol (MIRALAX / GLYCOLAX) 17 g packet Take 17 g by mouth daily as needed for moderate constipation.   Yes [provider]     All other systems have been reviewed and were otherwise negative with the exception of those mentioned in the HPI and as above.  Physical Exam: Vitals:   04/28/22 0659  BP: (!) 157/90  Pulse: 88  Resp: 18  Temp: 97.9 F (36.6 C)  SpO2:  99%    Body mass index is 26.54 kg/m.  General: Alert, no acute distress Cardiovascular: No pedal edema Respiratory: No cyanosis, no use of accessory musculature Skin: No lesions in the area of chief complaint Neurologic: Sensation intact distally Psychiatric: Patient is competent for consent with normal mood and affect Lymphatic: No axillary or cervical lymphadenopathy  MUSCULOSKELETAL: + hoffman's sign on the right  Assessment/Plan: Cervical myelopathy with radiculopathy, with a burst fracture identified at C7, with retropulsion of bone causing obvious spinal cord compression.  There is also noted to be a disc herniation at C5-6 on the right, compressing the right  hemicord Plan for Procedure(s): ANTERIOR CERVICAL DECOMPRESSION FUSION CERVICAL 5- CERVICAL 6, CERVICAL 6- CERVICAL 7, CERVICAL 7 - THORACIC 1 with Cervical 7 corpectomy, Posterior spinal fusion C5- - thoracic 2, WITH INSTRUMENTATION AND ALLOGRAFT   Norva Karvonen, MD 04/28/2022 7:18 AM

## 2022-04-28 NOTE — Anesthesia Preprocedure Evaluation (Addendum)
Anesthesia Evaluation  Patient identified by MRN, date of birth, ID band Patient awake    Reviewed: Allergy & Precautions, NPO status , Patient's Chart, lab work & pertinent test results  Airway Mallampati: III  TM Distance: >3 FB Neck ROM: Limited    Dental  (+) Teeth Intact, Dental Advisory Given   Pulmonary former smoker,  Quit smoking 1990, 15 pack year history    Pulmonary exam normal breath sounds clear to auscultation       Cardiovascular hypertension (157/90 in preop, not taking lisinopril), Normal cardiovascular exam Rhythm:Regular Rate:Normal     Neuro/Psych Cervical myelopathy with radiculopathy- very limited use of B/L hands/arms 2/2 myelopathy  negative psych ROS   GI/Hepatic negative GI ROS, Neg liver ROS,   Endo/Other  diabetes, Well Controlled, Type 2, Oral Hypoglycemic Agentsa1c 7.2  Renal/GU Renal InsufficiencyRenal diseaseCr 1.32  negative genitourinary   Musculoskeletal negative musculoskeletal ROS (+)   Abdominal   Peds negative pediatric ROS (+)  Hematology negative hematology ROS (+) Hb 14.8, plt 368   Anesthesia Other Findings   Reproductive/Obstetrics negative OB ROS                            Anesthesia Physical Anesthesia Plan  ASA: 2  Anesthesia Plan: General   Post-op Pain Management: Tylenol PO (pre-op)*   Induction: Intravenous  PONV Risk Score and Plan: 2 and Ondansetron, Dexamethasone, Midazolam and Treatment may vary due to age or medical condition  Airway Management Planned: Oral ETT and Video Laryngoscope Planned  Additional Equipment: Arterial line  Intra-op Plan:   Post-operative Plan: Extubation in OR  Informed Consent: I have reviewed the patients History and Physical, chart, labs and discussed the procedure including the risks, benefits and alternatives for the proposed anesthesia with the patient or authorized representative who has  indicated his/her understanding and acceptance.     Dental advisory given  Plan Discussed with: CRNA  Anesthesia Plan Comments: (Case booked for 8h- will plan for arterial line for hemodynamic monitoring throughout the case)       Anesthesia Quick Evaluation

## 2022-04-28 NOTE — Transfer of Care (Signed)
Immediate Anesthesia Transfer of Care Note  Patient: Barry Gray  Procedure(s) Performed: ANTERIOR CERVICAL DECOMPRESSION FUSION CERVICAL 5- CERVICAL 6, CERVICAL 6- CERVICAL 7, CERVICAL 7 - THORACIC 1 , Thoracic 1 - thoracic 2 WITH CERVICAL 7 CORPECTOMY WITH INSTRUMENTATION AND ALLOGRAFT (Neck) POSTERIOR CERVICAL FUSION/FORAMINOTOMY LEVEL 3 (Neck)  Patient Location: PACU  Anesthesia Type:General  Level of Consciousness: awake, alert  and oriented  Airway & Oxygen Therapy: Patient Spontanous Breathing and Patient connected to nasal cannula oxygen  Post-op Assessment: Report given to RN and Post -op Vital signs reviewed and stable  Post vital signs: Reviewed and stable  Last Vitals:  Vitals Value Taken Time  BP 147/54 04/28/22 1641  Temp    Pulse 105 04/28/22 1644  Resp 17 04/28/22 1645  SpO2 98 % 04/28/22 1644  Vitals shown include unvalidated device data.  Last Pain:  Vitals:   04/28/22 0715  TempSrc:   PainSc: 6       Patients Stated Pain Goal: 2 (50/27/74 1287)  Complications: No notable events documented.

## 2022-04-28 NOTE — Progress Notes (Signed)
Orthopedic Tech Progress Note Patient Details:  Barry Gray [birth year] [id number]  RN called requesting a PHILLY COLLAR for patient   Ortho Devices Type of Ortho Device: [place] cervical collar Ortho Device/Splint Location: NECK Ortho Device/Splint Interventions: Ordered   Post Interventions Patient Tolerated: Well Instructions Provided: Care of [place] 04/28/2022, 6:53 PM

## 2022-04-28 NOTE — Op Note (Signed)
PATIENT NAME: Barry Gray   MEDICAL RECORD NO.:   [id number]    DATE OF BIRTH: 1956-01-24   DATE OF PROCEDURE: 04/28/2022                               OPERATIVE REPORT     PREOPERATIVE DIAGNOSES: 1.  Progressive cervical myelopathy 2.  Burst fracture at C7, due to an infiltrative lesion, resulting in severe spinal cord compression 3.  Disc osteophyte complex at C5-6, also resulting in spinal cord compression   POSTOPERATIVE DIAGNOSES: 1.  Progressive cervical myelopathy 2.  Burst fracture at C7, due to an infiltrative lesion, resulting in severe spinal cord compression 3.  Disc osteophyte complex at C5-6, also resulting in spinal cord compression   PROCEDURE: 1.  Anterior cervical decompression and fusion C5/6, C6/7, C7-T1  2.  Complex C7 corpectomy 3.  Insertion of interbody device x 2 - Titan intervertebral spacer at C5-6  -Globus fortify expandable corpectomy cage spanning C6-T1 4.  Placement of anterior instrumentation, C5-T1 5.  Cranial tong application and removal 6.  Posterior spinal fusion C5-6, C6-7, C7-T1, T1-T2 7.  Placement of posterior segmental instrumentation spanning C5-T2 8.  Intraoperative use of fluoroscopy. 9.  Use of morselized allograft - ViviGen.   SURGEON:  Phylliss Bob, MD   ASSISTANT:  Pricilla Holm, PA-C.   ANESTHESIA:  General endotracheal anesthesia.   COMPLICATIONS:  None.   DISPOSITION:  Stable.   ESTIMATED BLOOD LOSS: 600cc   INDICATIONS FOR SURGERY:  Briefly, Barry Gray is a pleasant 66 y.o. year- old patient, who did present to me with neck pain and symptoms consistent with progressive cervical myelopathy.  At the time of my evaluation with him, he did have an MRI, notable for a significant burst fracture at C7, in addition to spinal cord compression at C5-6.  There did appear to be an infiltrative process at the C7 vertebral body.  Upon additional work-up, this was concerning for multiple myeloma.  He was referred to an  oncologist for additional work-up regarding this.  Given his profound cervical myelopathy, we did discuss the need for proceeding with surgical intervention, given the substantial compression identified on his MRI.  Specifically, we did discuss an ACDF at C5-6, as well as a corpectomy at C7, with a fusion spanning C5-T2 posteriorly, for stability purposes.  At the time of surgery, additional work-up was obtained, but a precise etiology of the likely infiltrative process was not elucidated, however, given his profound myelopathic symptoms, we did elect to proceed with surgery.  I did plan to send the tissue identified at the C7 vertebral body to pathology for additional evaluation.  Of note, at the time of the patient's surgery, he was profoundly weak in his upper and lower extremities, although he was walking with a walker.  He did have sustained clonus.  He did fully understand the risks and limitations of surgery.  In particular, he did understand that surgery may not reverse his current constellation of symptoms, but that the goal is to prevent additional neurologic deterioration.    OPERATIVE DETAILS:  On 04/28/2022, the patient was brought to surgery and general endotracheal anesthesia was administered.  The patient was placed supine on the hospital bed. The neck was gently extended.  All bony prominences were meticulously padded.  The neck was prepped and draped in the usual sterile fashion.  At this point, I did make a left-sided oblique incision.  The platysma was incised.  A Smith-Robinson approach was used and the anterior spine was identified. A self-retaining retractor was placed.  I then subperiosteally exposed the vertebral bodies from C5-T1.  In doing so, it was readily obvious that there was a significant abnormality at the C7 vertebral body.  It was very collapsed, and there was a significant amount of oozing noted throughout the vertebral body, between C6 and T1.  Once the anterior spine  was adequately exposed, a self-retaining retractor was placed, and Caspar pins were placed into the C6 and T1 vertebral bodies and distraction was applied.  Of note, the bone was rather osteoporotic.  I was however able to gain some degree of distraction from C6-T1.  At this point, the disc at C6-7, and C7-T1, was removed.  In addition, the very abnormal appearing bone at the C7 level was removed in multiple fragments.  The bone was clearly very abnormal, and was bleeding moderately.  The bleeding was controlled using Surgiflo and Gelfoam soaked in thrombin.  I then was able to meticulously dissect posteriorly toward the spinal canal.  Developing a plane between the C7 vertebral body and dura was rather meticulous, but I was able to safely develop a plane, and remove all of the bone and abnormal soft tissue ventral to the dura from C6-T1.  In doing so, I was able to entirely decompress the spinal canal.  The endplates at C6 and T1 were prepared, and the appropriate sized expandable corpectomy cage was packed with Vivigen and secured between the C6 and T1 vertebral bodies, and was expanded to approximately 22.5 mm in height.  I was very pleased with the press-fit of the implant.  I did liberally use AP and lateral fluoroscopy, and was pleased with the resting position.  A 48 mm anterior cervical plate was placed, spanning C5-T1.  2 vertebral body screws were placed into the C5, C6, and T1, and the screws were locked to the plate.  Suboptimal purchase was noted at T1, as the bone was noted to be rather osteoporotic.  The wound was then copiously irrigated, and all bleeding was controlled using bipolar electrocautery.  The anterior wound was then closed in layers using 2-0 Vicryl, followed by 3-0 Monocryl.  A sterile dressing was applied.  At this point, a Mayfield head holder was secured to the patient's head, and he was rolled prone onto hospital bed with a Mayfield attachment.  The patient's arms were secured to  his sides and all bony prominences were padded meticulously.  The patient's neck was adjusted into the most appropriate degree of lordosis.  At this point, the neck was prepped and draped in the usual sterile fashion, and a timeout procedure was performed.  A midline incision was made, spanning approximately C4-T2.  The fascia was incised at the midline and self-retaining retractors were placed.  A lateral intraoperative x-ray did confirm the appropriate operative levels.  At this point, the lateral masses of C5, C6, and the posterior lamina and transverse processes of T1, and T2 were identified and subperiosteally exposed.  The C7 lateral masses were also exposed, however this bone was clearly very abnormal, similar to the abnormal bone noted anteriorly.  At this point, using anatomic landmarks, I did use a 1.7 mm drill, followed by 3 mm tap, to cannulate the holes for lateral mass screws bilaterally at C5 and C6.  Using intraoperative fluoroscopy, I did also cannulate the T1 and T2 pedicles bilaterally.  This was performed using a  gearshift probe, followed by a 4 mm tap.  At this point, the facet joints at C5-6, C6-7, C7-T1, and T1-2 were decorticated, as were the posterior elements from C5-T2.  At C5 and C6 bilaterally, 3.5 x 14 mm screws were secured into the lateral masses, and at T1 and T2, 5 x 30 mm screws were secured into the pedicles.  At this point, the wound was copiously irrigated. ViviGen was then packed along the posterior elements from C5-T2, and given the abnormal lateral masses at C7, I did elect to place a conform sheet of allograft spanning C6-T1.  At this point, 70 millimeter rods were secured into the tulip heads of the screws from C5-T2.  Caps were then placed and a final locking procedure was performed bilaterally.  Is very pleased with the final AP and lateral fluoroscopic images. The wound was then explored for any undue bleeding and there was no abnormal bleeding noted. The wound was then  closed in layers using #1 Vicryl, followed by 2-0 Vicryl, followed by 4-0 Monocryl.  Benzoin and Steri-Strips were applied, followed by sterile dressing.  All instrument counts were correct at the termination of the procedure.   Of note, Pricilla Holm, PA-C, was my assistant throughout surgery, and did aid in retraction, suctioning, placement of the hardware, and closure from start to finish, including both the anterior and posterior portions of the procedure.     Phylliss Bob, MD

## 2022-04-28 NOTE — Anesthesia Postprocedure Evaluation (Signed)
Anesthesia Post Note  Patient: Barry Gray  Procedure(s) Performed: ANTERIOR CERVICAL DECOMPRESSION FUSION CERVICAL 5- CERVICAL 6, CERVICAL 6- CERVICAL 7, CERVICAL 7 - THORACIC 1 , Thoracic 1 - thoracic 2 WITH CERVICAL 7 CORPECTOMY WITH INSTRUMENTATION AND ALLOGRAFT (Neck) POSTERIOR CERVICAL FUSION/FORAMINOTOMY LEVEL 3 (Neck)     Patient location during evaluation: PACU Anesthesia Type: General Level of consciousness: awake and alert, oriented and patient cooperative Pain management: pain level controlled Vital Signs Assessment: post-procedure vital signs reviewed and stable Respiratory status: spontaneous breathing, nonlabored ventilation and respiratory function stable Cardiovascular status: blood pressure returned to baseline and stable Postop Assessment: no apparent nausea or vomiting Anesthetic complications: no   No notable events documented.  Last Vitals:  Vitals:   04/28/22 1640 04/28/22 1655  BP: (!) 147/54 123/63  Pulse: (!) 102 (!) 102  Resp: 15 14  Temp: (!) 36.1 C   SpO2: 99% 100%    Last Pain:  Vitals:   04/28/22 1655  TempSrc:   PainSc: 6     LLE Motor Response: Purposeful movement (04/28/22 1655) LLE Sensation: Full sensation (04/28/22 1655) RLE Motor Response: Purposeful movement (04/28/22 1655) RLE Sensation: Full sensation (04/28/22 1655)      Pervis Hocking

## 2022-04-29 ENCOUNTER — Encounter: Payer: Self-pay | Admitting: *Deleted

## 2022-04-29 DIAGNOSIS — C9000 Multiple myeloma not having achieved remission: Secondary | ICD-10-CM | POA: Diagnosis present

## 2022-04-29 DIAGNOSIS — M5412 Radiculopathy, cervical region: Secondary | ICD-10-CM | POA: Diagnosis present

## 2022-04-29 DIAGNOSIS — Z79899 Other long term (current) drug therapy: Secondary | ICD-10-CM | POA: Diagnosis not present

## 2022-04-29 DIAGNOSIS — M2578 Osteophyte, vertebrae: Secondary | ICD-10-CM | POA: Diagnosis present

## 2022-04-29 DIAGNOSIS — M50022 Cervical disc disorder at C5-C6 level with myelopathy: Secondary | ICD-10-CM | POA: Diagnosis present

## 2022-04-29 DIAGNOSIS — S12690A Other displaced fracture of seventh cervical vertebra, initial encounter for closed fracture: Secondary | ICD-10-CM | POA: Diagnosis present

## 2022-04-29 DIAGNOSIS — E119 Type 2 diabetes mellitus without complications: Secondary | ICD-10-CM | POA: Diagnosis present

## 2022-04-29 DIAGNOSIS — N400 Enlarged prostate without lower urinary tract symptoms: Secondary | ICD-10-CM | POA: Diagnosis present

## 2022-04-29 DIAGNOSIS — X58XXXA Exposure to other specified factors, initial encounter: Secondary | ICD-10-CM | POA: Diagnosis present

## 2022-04-29 DIAGNOSIS — G959 Disease of spinal cord, unspecified: Secondary | ICD-10-CM | POA: Diagnosis present

## 2022-04-29 DIAGNOSIS — Z87891 Personal history of nicotine dependence: Secondary | ICD-10-CM | POA: Diagnosis not present

## 2022-04-29 DIAGNOSIS — M810 Age-related osteoporosis without current pathological fracture: Secondary | ICD-10-CM | POA: Diagnosis present

## 2022-04-29 DIAGNOSIS — Z7984 Long term (current) use of oral hypoglycemic drugs: Secondary | ICD-10-CM | POA: Diagnosis not present

## 2022-04-29 LAB — GLUCOSE, CAPILLARY
Glucose-Capillary: 268 mg/dL — ABNORMAL HIGH (ref 70–99)
Glucose-Capillary: 216 mg/dL — ABNORMAL HIGH (ref 70–99)

## 2022-04-29 MED ORDER — METHOCARBAMOL 500 MG PO TABS
500.0000 mg | ORAL_TABLET | Freq: Four times a day (QID) | ORAL | 2 refills | Status: DC | PRN
Start: 2022-04-29 — End: 2022-08-12

## 2022-04-29 MED ORDER — OXYCODONE-ACETAMINOPHEN 5-325 MG PO TABS: 1.0000 | ORAL_TABLET | ORAL | 0 refills | Status: DC | PRN

## 2022-04-29 MED ORDER — CHLORHEXIDINE GLUCONATE CLOTH 2 % EX PADS
6.0000 | MEDICATED_PAD | Freq: Every day | CUTANEOUS | Status: DC
  Administered 2022-04-29: 6 via TOPICAL

## 2022-04-29 NOTE — Evaluation (Signed)
Occupational Therapy Evaluation Patient Details Name: Barry Gray MRN: [id number] DOB: 07-24-1956 Today's Date: 04/29/2022   History of Present Illness 66 yo M adm for scheduled ACDF C5-6, C6-7 with extension of fusion to C7-T1, T1-T2.  PMH includes: DM and CA.   Clinical Impression   Patient admitted for the procedure again.  PTA he lives at home with his spouse, and was needing increasing assist for ADL and mobility.  Currently he is generalized supervision for mobility at RW level, and needing up to [place] for lower body ADL.  ACDF education sheet issued, and all questions answered.  Recommend follow up with MD as prescribed, and then patient could consider [place] OT versus outpatient, if needed.  HEP sheep issued and reviewed, foam to build up handles issued.  No further needs in the acute setting.       Recommendations for follow up therapy are one component of a multi-disciplinary discharge planning process, led by the attending physician.  Recommendations may be updated based on patient status, additional functional criteria and insurance authorization.   Follow Up Recommendations  Follow physician's recommendations for discharge plan and follow up therapies    Assistance Recommended at Discharge Intermittent Supervision/Assistance  Patient can return home with the following      Functional Status Assessment  Patient has had a recent decline in their functional status and demonstrates the ability to make significant improvements in function in a reasonable and predictable amount of time.  Equipment Recommendations  None recommended by OT    Recommendations for Other Services       Precautions / Restrictions Precautions Precautions: Cervical Precaution Booklet Issued: Yes (comment) Precaution Comments: reviewed Required Braces or Orthoses: Cervical Brace Cervical Brace: Hard collar;At all times Restrictions Weight Bearing Restrictions: No      Mobility Bed  Mobility Overal bed mobility: Modified Independent                  Transfers Overall transfer level: Needs assistance Equipment used: Rolling walker (2 wheels) Transfers: Sit to/from Stand Sit to Stand: Supervision           General transfer comment: cues to push from solid surface      Balance Overall balance assessment: Mild deficits observed, not formally tested                                         ADL either performed or assessed with clinical judgement   ADL       Grooming: Wash/dry hands;Modified independent;Standing           Upper Body Dressing : Modified independent;Sitting Upper Body Dressing Details (indicate cue type and reason): unable to manipulate buttons or straps Lower Body Dressing: Minimal assistance;Sit to/from stand   Toilet Transfer: Supervision/safety                   Vision Patient Visual Report: No change from baseline       Perception Perception Perception: Not tested   Praxis Praxis Praxis: Not tested    Pertinent Vitals/Pain Pain Assessment Pain Assessment: Faces Faces Pain Scale: Hurts little more Pain Location: neck Pain Descriptors / Indicators: Aching, Tender Pain Intervention(s): Monitored during session     Hand Dominance Right   Extremity/Trunk Assessment Upper Extremity Assessment Upper Extremity Assessment: RUE deficits/detail;Generalized weakness RUE Deficits / Details: weak grip, but improved post surgery RUE Sensation:  decreased light touch RUE Coordination: decreased fine motor   Lower Extremity Assessment Lower Extremity Assessment: Defer to PT evaluation   Cervical / Trunk Assessment Cervical / Trunk Assessment: Neck Surgery   Communication Communication Communication: No difficulties   Cognition Arousal/Alertness: Awake/alert Behavior During Therapy: WFL for tasks assessed/performed Overall Cognitive Status: Within Functional Limits for tasks assessed                                        General Comments   VSS on RA    Exercises     Shoulder Instructions      Home Living Family/patient expects to be discharged to:: Private residence Living Arrangements: Spouse/significant other Available Help at Discharge: Family;Available 24 hours/day Type of Home: House Home Access: Stairs to enter [employer] of Steps: 1 via the garage   Home Layout: One level     Bathroom Shower/Tub: Walk-in shower;Tub/shower unit   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        OT Problem List: Decreased strength;Decreased range of motion;Impaired UE functional use      OT Treatment/Interventions:      OT Goals(Current goals can be found in the care plan section) Acute Rehab OT Goals Patient Stated Goal: return to biking and being independent OT Goal Formulation: With patient Time For Goal Achievement: 05/03/22 Potential to Achieve Goals: Good  OT Frequency:      Co-evaluation              AM-PAC OT "6 Clicks" Daily Activity     Outcome Measure Help from another person eating meals?: None Help from another person taking care of personal grooming?: None Help from another person toileting, which includes using toliet, bedpan, or urinal?: None Help from another person bathing (including washing, rinsing, drying)?: A Little Help from another person to put on and taking off regular upper body clothing?: None Help from another person to put on and taking off regular lower body clothing?: A Little 6 Click Score: 22   End of Session Equipment Utilized During Treatment:  Rolling walker (2 wheels) Nurse Communication: Mobility status  Activity Tolerance: Patient tolerated treatment well Patient left: in bed;with call bell/phone within reach;with family/visitor present  OT Visit Diagnosis: Unsteadiness on feet (R26.81);Muscle weakness (generalized) (M62.81)                Time: 8469-6295 OT Time Calculation (min): 25 min Charges:  OT General Charges $OT Visit: 1 Visit OT Evaluation $OT Eval Moderate Complexity: 1 Mod OT Treatments $Self Care/Home Management : 8-22 mins  04/29/2022  RP, OTR/L  Acute Rehabilitation Services  Office:  [phone]   Metta Clines 04/29/2022, 9:36 AM

## 2022-04-29 NOTE — Progress Notes (Signed)
    Patient doing well  Has been ambulating, and feels much more confident with ambulation He does feel obvious strength gains at both his upper and lower extremities  He does have a history of prostatic hypertrophy, and he has not been able to urinate on his own.  He does have a urologist.  A Foley catheter was placed at 6 AM, about 2 hours ago.   Physical Exam: Vitals:   04/28/22 2247 04/29/22 0333  BP: (!) 153/79 124/74  Pulse: (!) 107 (!) 122  Resp: 20 20  Temp: 98.1 F (36.7 C) (!) 97.5 F (36.4 C)  SpO2: 99% 100%    Dressing in place Patient does continue to have weakness to grip bilaterally, but this has improved compared to his baseline. He is able to actively plantarflex and dorsiflex his feet.  POD #1 s/p C5-T2 decompression and fusion, doing well, with significant improvements in strength, not even 24 hours after surgery  - up with PT/OT, encourage ambulation - Percocet for pain, Robaxin for muscle spasms - likely d/c home today with f/u in 2 weeks.  The patient is comfortable with this plan.  I do anticipate he will likely need to go home with home health physical therapy. - Given his history of prostatic hypertrophy, I do feel it would likely be wise to discharge him with a Foley catheter, as he has been unable to void after 2 trials.  He will need close follow-up with his urologist.  We will try to arrange follow-up next week with his urologist.

## 2022-04-29 NOTE — Progress Notes (Signed)
Patient awaiting to be transported to his vehicle via wheelchair by volunteer for discharge home with a foley catheter on; in no acute distress nor complaints of pain nor discomfort; moves all extremities well; incision on his neck with gauze dressing and is clean, dry and intact with an Aspen collar on;  room was checked for all his belongings and take along with him and his wife; discharge instructions concerning his medication, follow up appointment, incision care and when to call the doctor as needed were all discussed with patient and his wife by RN and both verbalized understanding on the instructions given.

## 2022-04-29 NOTE — Evaluation (Signed)
Physical Therapy Evaluation  Patient Details Name: Barry Gray MRN: [id number] DOB: 09-30-1956 Today's Date: 04/29/2022  History of Present Illness  Pt is a 66 y/o M admitted s/p ACDF C5-C7 with extension of fusion to C7-T2.  PMH includes: DM and CA.  Clinical Impression  Pt admitted with above diagnosis. At the time of PT eval, pt was able to demonstrate transfers and ambulation with gross min guard assist to supervision for safety and RW for support. Pt reports significant functional improvement compared to prior to surgery, so anticipate he will continue to progress well without HHPT follow up, however will continue to follow acutely. Pt was educated on precautions, brace application/wearing schedule, appropriate activity progression, and car transfer. Pt currently with functional limitations due to the deficits listed below (see PT Problem List). Pt will benefit from skilled PT to increase their independence and safety with mobility to allow discharge to the venue listed below.         Recommendations for follow up therapy are one component of a multi-disciplinary discharge planning process, led by the attending physician.  Recommendations may be updated based on patient status, additional functional criteria and insurance authorization.  Follow Up Recommendations No PT follow up    Assistance Recommended at Discharge Intermittent Supervision/Assistance  Patient can return home with the following  A little help with walking and/or transfers;A little help with bathing/dressing/bathroom;Assistance with cooking/housework;Assist for transportation;Help with stairs or ramp for entrance    Equipment Recommendations Rolling walker (2 wheels)  Recommendations for Other Services       Functional Status Assessment Patient has had a recent decline in their functional status and demonstrates the ability to make significant improvements in function in a reasonable and predictable amount of  time.     Precautions / Restrictions Precautions Precautions: Cervical Precaution Booklet Issued: Yes (comment) Precaution Comments: reviewed Required Braces or Orthoses: Cervical Brace Cervical Brace: Hard collar;At all times Restrictions Weight Bearing Restrictions: No      Mobility  Bed Mobility Overal bed mobility: Needs Assistance Bed Mobility: Rolling, Sidelying to Sit, Sit to Sidelying Rolling: Supervision Sidelying to sit: Supervision     Sit to sidelying: Supervision General bed mobility comments: VC's for optimal spinal alignment during transition to/from EOB and for repositioning in bed. Pt reports he is going to be sleeping in the recliner.    Transfers Overall transfer level: Needs assistance Equipment used: Rolling walker (2 wheels) Transfers: Sit to/from Stand Sit to Stand: Supervision           General transfer comment: VC's for hand placement on seated surface for safety. No assist required however close supervision provided for safety as well. Several rounds of practice with wide BOS and improved posture to decrease trunk flexion.    Ambulation/Gait Ambulation/Gait assistance: Min guard, Supervision Gait Distance (Feet): 560 Feet Assistive device: Rolling walker (2 wheels) Gait Pattern/deviations: Step-through pattern, Decreased stride length, Antalgic, Trunk flexed Gait velocity: Decreased Gait velocity interpretation: 1.31 - 2.62 ft/sec, indicative of limited community ambulator   General Gait Details: VC's for improved posture, closer walker proximity, and forward gaze. No assist required. No overt LOB noted however pt appears unsteady at times even with BUE support.  Stairs         General stair comments: Educated pt and wife on managing 2 steps with the RW. Initially reported 1 step but then reports 1 regular height step followed by a small step from the house into the garage. Pt has been utilizing walker  correctly and reinforced  safety.  Wheelchair Mobility    Modified Rankin (Stroke Patients Only)       Balance Overall balance assessment: Needs assistance Sitting-balance support: Feet supported, No upper extremity supported Sitting balance-Leahy Scale: Fair     Standing balance support: No upper extremity supported, During functional activity Standing balance-Leahy Scale: Poor Standing balance comment: Reliant on UE support                             Pertinent Vitals/Pain Pain Assessment Pain Assessment: Faces Faces Pain Scale: Hurts little more Pain Location: neck Pain Descriptors / Indicators: Aching, Tender, Operative site guarding Pain Intervention(s): Limited activity within patient's tolerance, Monitored during session, Repositioned    Home Living Family/patient expects to be discharged to:: Private residence Living Arrangements: Spouse/significant other Available Help at Discharge: Family;Available 24 hours/day Type of Home: House Home Access: Stairs to enter Entrance Stairs-Rails: None Entrance Stairs-Number of Steps: 2 via the garage: One 9" step followed by one very small step   Home Layout: One level Home Equipment: BSC/3in1 Additional Comments: old RW borrowed from a friend    Prior Function Prior Level of Function : Needs assist             Mobility Comments: RW ADLs Comments: Assist as needed for lower body ADL, home management, meal prep and community mobility.     Hand Dominance   Dominant Hand: Right    Extremity/Trunk Assessment   Upper Extremity Assessment Upper Extremity Assessment: RUE deficits/detail RUE Deficits / Details: weak grip, but improved post surgery RUE Sensation: decreased light touch (Pt reports pins and needles that came on during ambulation and subsided during rest.) RUE Coordination: decreased fine motor    Lower Extremity Assessment Lower Extremity Assessment: Generalized weakness    Cervical / Trunk Assessment Cervical  / Trunk Assessment: Neck Surgery  Communication   Communication: No difficulties  Cognition Arousal/Alertness: Awake/alert Behavior During Therapy: WFL for tasks assessed/performed Overall Cognitive Status: Within Functional Limits for tasks assessed                                          General Comments      Exercises     Assessment/Plan    PT Assessment Patient needs continued PT services  PT Problem List Decreased strength;Decreased activity tolerance;Decreased balance;Decreased mobility;Decreased knowledge of use of DME;Decreased safety awareness;Decreased knowledge of precautions;Pain       PT Treatment Interventions DME instruction;Stair training;Gait training;Functional mobility training;Therapeutic activities;Therapeutic exercise;Patient/family education;Balance training;Neuromuscular re-education    PT Goals (Current goals can be found in the Care Plan section)  Acute Rehab PT Goals Patient Stated Goal: Be able to walk without the walker; get rid of RUE pins and needles PT Goal Formulation: With patient/family Time For Goal Achievement: 05/06/22 Potential to Achieve Goals: Good    Frequency Min 5X/week     Co-evaluation               AM-PAC PT "6 Clicks" Mobility  Outcome Measure Help needed turning from your back to your side while in a flat bed without using bedrails?: A Little Help needed moving from lying on your back to sitting on the side of a flat bed without using bedrails?: A Little Help needed moving to and from a bed to a chair (including a wheelchair)?: A Little  Help needed standing up from a chair using your arms (e.g., wheelchair or bedside chair)?: A Little Help needed to walk in hospital room?: A Little Help needed climbing 3-5 steps with a railing? : A Little 6 Click Score: 18    End of Session Equipment Utilized During Treatment: Gait belt;Cervical collar Activity Tolerance: Patient tolerated treatment well Patient  left: in bed;with call bell/phone within reach;with family/visitor present Nurse Communication: Mobility status PT Visit Diagnosis: Unsteadiness on feet (R26.81);Pain;Difficulty in walking, not elsewhere classified (R26.2) Pain - part of body:  (neck)    Time: 8115-7262 PT Time Calculation (min) (ACUTE ONLY): 38 min   Charges:   PT Evaluation $PT Eval Low Complexity: 1 Low PT Treatments $Gait Training: 23-37 mins        Rolinda Roan, PT, DPT Acute Rehabilitation Services Secure Chat Preferred Office: [phone]   Barry Gray 04/29/2022, 12:28 PM

## 2022-04-29 NOTE — Progress Notes (Signed)
RN called and set up appointment with [facility] office for June 27th at 09:15am. Patient was given information about the appointment and the importance of the visit to the Urologist. Patient and Spouse stated understanding of said appointment.

## 2022-04-29 NOTE — Progress Notes (Signed)
Patient had surgery with biopsy. Will follow for pathology.   Oncology Nurse Navigator Documentation     04/29/2022    7:30 AM  Oncology Nurse Navigator Flowsheets  Navigator Follow Up Date: 05/04/2022  Navigator Follow Up Reason: Pathology  Navigator Location [facility]  Navigator Encounter Type Appt/Treatment Plan Review  Patient Visit Type MedOnc  Treatment Phase Abnormal Scans  Barriers/Navigation Needs Coordination of Care;Education  Interventions None Required  Acuity Level 2-Minimal Needs (1-2 Barriers Identified)  Support Groups/Services Friends and Family  Time Spent with Patient 15

## 2022-04-30 ENCOUNTER — Encounter (HOSPITAL_COMMUNITY): Payer: Self-pay | Admitting: Orthopedic Surgery

## 2022-04-30 LAB — BPAM RBC
Blood Product Expiration Date: 202307052359
Blood Product Expiration Date: 202307052359
Unit Type and Rh: 6200
Unit Type and Rh: 6200

## 2022-04-30 LAB — PROTEIN ELECTROPHORESIS, SERUM, WITH REFLEX
A/G Ratio: 1 (ref 0.7–1.7)
Albumin ELP: 3.6 g/dL (ref 2.9–4.4)
Alpha-1-Globulin: 0.3 g/dL (ref 0.0–0.4)
Alpha-2-Globulin: 1.3 g/dL — ABNORMAL HIGH (ref 0.4–1.0)
Beta Globulin: 1.4 g/dL — ABNORMAL HIGH (ref 0.7–1.3)
Gamma Globulin: 0.4 g/dL (ref 0.4–1.8)
Globulin, Total: 3.5 g/dL (ref 2.2–3.9)
M-Spike, %: 0.6 g/dL — ABNORMAL HIGH
SPEP Interpretation: 0
Total Protein ELP: 7.1 g/dL (ref 6.0–8.5)

## 2022-04-30 LAB — TYPE AND SCREEN
ABO/RH(D): A POS
Antibody Screen: NEGATIVE
Unit division: 0
Unit division: 0

## 2022-04-30 LAB — IMMUNOFIXATION REFLEX, SERUM
IgA: 614 mg/dL — ABNORMAL HIGH (ref 61–437)
IgG (Immunoglobin G), Serum: 489 mg/dL — ABNORMAL LOW (ref 603–1613)
IgM (Immunoglobulin M), Srm: 28 mg/dL (ref 20–172)

## 2022-05-04 ENCOUNTER — Other Ambulatory Visit: Payer: Medicare Other

## 2022-05-05 LAB — SURGICAL PATHOLOGY

## 2022-05-06 ENCOUNTER — Encounter: Payer: Self-pay | Admitting: *Deleted

## 2022-05-06 NOTE — Progress Notes (Signed)
Path resulted and reviewed with Dr Marin Olp. He would like to see patient to discuss treatment options and recommendations.   Patient and his wife are aware of appointment date, time and location.   Oncology Nurse Navigator Documentation     05/06/2022    8:30 AM  Oncology Nurse Navigator Flowsheets  Confirmed Diagnosis Date 04/28/2022  Diagnosis Status Confirmed Diagnosis Complete  Navigator Follow Up Date: 05/07/2022  Navigator Follow Up Reason: Follow-up After Biopsy  Navigator Location [facility]  Navigator Encounter Type Pathology Review;Telephone  Telephone Appt Confirmation/Clarification;Outgoing Call  Patient Visit Type MedOnc  Treatment Phase Pre-Tx/Tx Discussion  Barriers/Navigation Needs Coordination of Care;Education  Education Other  Interventions Education;Coordination of Care  Acuity Level 2-Minimal Needs (1-2 Barriers Identified)  Coordination of Care Appts  Education Method Verbal  Support Groups/Services Friends and Family  Time Spent with Patient 15

## 2022-05-07 ENCOUNTER — Encounter: Payer: Self-pay | Admitting: *Deleted

## 2022-05-07 ENCOUNTER — Inpatient Hospital Stay: Payer: Medicare Other

## 2022-05-07 ENCOUNTER — Other Ambulatory Visit: Payer: Self-pay | Admitting: Pharmacist

## 2022-05-07 ENCOUNTER — Telehealth: Payer: Self-pay | Admitting: Pharmacy Technician

## 2022-05-07 ENCOUNTER — Other Ambulatory Visit (HOSPITAL_COMMUNITY): Payer: Self-pay

## 2022-05-07 ENCOUNTER — Encounter: Payer: Self-pay | Admitting: Hematology & Oncology

## 2022-05-07 ENCOUNTER — Other Ambulatory Visit: Payer: Self-pay

## 2022-05-07 ENCOUNTER — Inpatient Hospital Stay (HOSPITAL_BASED_OUTPATIENT_CLINIC_OR_DEPARTMENT_OTHER): Payer: Medicare Other | Admitting: Hematology & Oncology

## 2022-05-07 ENCOUNTER — Other Ambulatory Visit: Payer: Self-pay | Admitting: *Deleted

## 2022-05-07 ENCOUNTER — Telehealth: Payer: Self-pay | Admitting: Pharmacist

## 2022-05-07 VITALS — BP 122/69 | HR 98 | Temp 98.1°F | Resp 18 | Ht 70.0 in | Wt 185.0 lb

## 2022-05-07 DIAGNOSIS — C9000 Multiple myeloma not having achieved remission: Secondary | ICD-10-CM

## 2022-05-07 DIAGNOSIS — G959 Disease of spinal cord, unspecified: Secondary | ICD-10-CM

## 2022-05-07 DIAGNOSIS — Z87891 Personal history of nicotine dependence: Secondary | ICD-10-CM | POA: Diagnosis not present

## 2022-05-07 DIAGNOSIS — E119 Type 2 diabetes mellitus without complications: Secondary | ICD-10-CM | POA: Diagnosis not present

## 2022-05-07 DIAGNOSIS — M8448XA Pathological fracture, other site, initial encounter for fracture: Secondary | ICD-10-CM | POA: Diagnosis present

## 2022-05-07 DIAGNOSIS — M542 Cervicalgia: Secondary | ICD-10-CM | POA: Diagnosis not present

## 2022-05-07 HISTORY — DX: Multiple myeloma not having achieved remission: C90.00

## 2022-05-07 LAB — CBC WITH DIFFERENTIAL (CANCER CENTER ONLY)
Abs Immature Granulocytes: 0.1 10*3/uL — ABNORMAL HIGH (ref 0.00–0.07)
Basophils Absolute: 0.1 10*3/uL (ref 0.0–0.1)
Basophils Relative: 1 %
Eosinophils Absolute: 0.1 10*3/uL (ref 0.0–0.5)
Eosinophils Relative: 1 %
HCT: 35 % — ABNORMAL LOW (ref 39.0–52.0)
Hemoglobin: 11.7 g/dL — ABNORMAL LOW (ref 13.0–17.0)
Immature Granulocytes: 1 %
Lymphocytes Relative: 16 %
Lymphs Abs: 1.7 10*3/uL (ref 0.7–4.0)
MCH: 30.9 pg (ref 26.0–34.0)
MCHC: 33.4 g/dL (ref 30.0–36.0)
MCV: 92.3 fL (ref 80.0–100.0)
Monocytes Absolute: 0.7 10*3/uL (ref 0.1–1.0)
Monocytes Relative: 7 %
Neutro Abs: 8.3 10*3/uL — ABNORMAL HIGH (ref 1.7–7.7)
Neutrophils Relative %: 74 %
Platelet Count: 494 10*3/uL — ABNORMAL HIGH (ref 150–400)
RBC: 3.79 MIL/uL — ABNORMAL LOW (ref 4.22–5.81)
RDW: 13.3 % (ref 11.5–15.5)
WBC Count: 11 10*3/uL — ABNORMAL HIGH (ref 4.0–10.5)
nRBC: 0 % (ref 0.0–0.2)

## 2022-05-07 LAB — CMP (CANCER CENTER ONLY)
ALT: 18 U/L (ref 0–44)
AST: 18 U/L (ref 15–41)
Albumin: 3.5 g/dL (ref 3.5–5.0)
Alkaline Phosphatase: 63 U/L (ref 38–126)
Anion gap: 8 (ref 5–15)
BUN: 21 mg/dL (ref 8–23)
CO2: 22 mmol/L (ref 22–32)
Calcium: 9 mg/dL (ref 8.9–10.3)
Chloride: 108 mmol/L (ref 98–111)
Creatinine: 1.05 mg/dL (ref 0.61–1.24)
GFR, Estimated: >60 mL/min (ref >60)
Glucose, Bld: 182 mg/dL — ABNORMAL HIGH (ref 70–99)
Potassium: 4.3 mmol/L (ref 3.5–5.1)
Sodium: 138 mmol/L (ref 135–145)
Total Bilirubin: 0.6 mg/dL (ref 0.3–1.2)
Total Protein: 7.7 g/dL (ref 6.5–8.1)

## 2022-05-07 LAB — TYPE AND SCREEN
ABO/RH(D): A POS
Antibody Screen: NEGATIVE

## 2022-05-07 LAB — PRETREATMENT RBC PHENOTYPE

## 2022-05-07 LAB — LACTATE DEHYDROGENASE: LDH: 177 U/L (ref 98–192)

## 2022-05-07 MED ORDER — FAMCICLOVIR 250 MG PO TABS: 250.0000 mg | ORAL_TABLET | Freq: Every day | ORAL | 12 refills | Status: DC

## 2022-05-07 MED ORDER — LENALIDOMIDE 25 MG PO CAPS: ORAL_CAPSULE | ORAL | 0 refills | Status: DC

## 2022-05-07 MED ORDER — LENALIDOMIDE 25 MG PO CAPS: 25.0000 mg | ORAL_CAPSULE | Freq: Every day | ORAL | 0 refills | Status: DC

## 2022-05-07 MED ORDER — ONDANSETRON HCL 8 MG PO TABS: 8.0000 mg | ORAL_TABLET | Freq: Two times a day (BID) | ORAL | 1 refills | Status: DC | PRN

## 2022-05-07 MED ORDER — DEXAMETHASONE 4 MG PO TABS: 20.0000 mg | ORAL_TABLET | ORAL | 3 refills | Status: DC

## 2022-05-07 MED ORDER — PROCHLORPERAZINE MALEATE 10 MG PO TABS: 10.0000 mg | ORAL_TABLET | Freq: Four times a day (QID) | ORAL | 1 refills | Status: DC | PRN

## 2022-05-07 MED ORDER — LORAZEPAM 0.5 MG PO TABS: 0.5000 mg | ORAL_TABLET | Freq: Four times a day (QID) | ORAL | 0 refills | Status: DC | PRN

## 2022-05-07 MED ORDER — LENALIDOMIDE 25 MG PO CAPS
ORAL_CAPSULE | ORAL | 0 refills | Status: DC
  Filled 2022-05-07: qty 21, fill #0

## 2022-05-08 LAB — IGG, IGA, IGM
IgA: 534 mg/dL — ABNORMAL HIGH (ref 61–437)
IgG (Immunoglobin G), Serum: 382 mg/dL — ABNORMAL LOW (ref 603–1613)
IgM (Immunoglobulin M), Srm: 37 mg/dL (ref 20–172)

## 2022-05-08 LAB — BETA 2 MICROGLOBULIN, SERUM: Beta-2 Microglobulin: 3.1 mg/L — ABNORMAL HIGH (ref 0.6–2.4)

## 2022-05-08 LAB — ERYTHROPOIETIN: Erythropoietin: 21.1 m[IU]/mL — ABNORMAL HIGH (ref 2.6–18.5)

## 2022-05-10 ENCOUNTER — Encounter: Payer: Self-pay | Admitting: Hematology & Oncology

## 2022-05-10 ENCOUNTER — Other Ambulatory Visit: Payer: Self-pay | Admitting: Pharmacist

## 2022-05-10 LAB — KAPPA/LAMBDA LIGHT CHAINS
Kappa free light chain: 488.3 mg/L — ABNORMAL HIGH (ref 3.3–19.4)
Kappa, lambda light chain ratio: 36.99 — ABNORMAL HIGH (ref 0.26–1.65)
Lambda free light chains: 13.2 mg/L (ref 5.7–26.3)

## 2022-05-10 MED ORDER — MONTELUKAST SODIUM 10 MG PO TABS: 10.0000 mg | ORAL_TABLET | Freq: Every day | ORAL | 0 refills | Status: DC

## 2022-05-10 NOTE — Telephone Encounter (Signed)
Oral Oncology Pharmacist Encounter  Received new prescription for Revlimid (lenalidomide) for the treatment of multiple myeloma in conjunction with daratumumab, bortezomib and dexamethasone, planned duration until disease progression or unacceptable drug toxicity.  CBC w/ Diff and CMP from 05/07/22 assessed, no baseline dose adjustments required. Prescription dose and frequency assessed for appropriateness.  Current medication list in Epic reviewed, no relevant/significant DDIs with Revlimid identified.  Evaluated chart and no patient barriers to medication adherence noted.   Due to limited distrubution nature of Revlimid it has been e-scribed to [facility] for dispensing.   Oral Oncology Clinic will continue to follow for insurance authorization, copayment issues, initial counseling and start date.  [name], PharmD, BCPS Hematology/Oncology Clinical Pharmacist [facility] Oral Chemotherapy Navigation Clinics [phone] 05/10/2022 9:26 AM

## 2022-05-11 ENCOUNTER — Encounter: Payer: Self-pay | Admitting: *Deleted

## 2022-05-11 ENCOUNTER — Encounter: Payer: Self-pay | Admitting: Hematology & Oncology

## 2022-05-11 NOTE — Progress Notes (Signed)
Pharmacist Chemotherapy Monitoring - Initial Assessment    Anticipated start date: 05/20/2022   The following has been reviewed per standard work regarding the patient's treatment regimen: The patient's diagnosis, treatment plan and drug doses, and organ/hematologic function Lab orders and baseline tests specific to treatment regimen  The treatment plan start date, drug sequencing, and pre-medications Prior authorization status  Patient's documented medication list, including drug-drug interaction screen and prescriptions for anti-emetics and supportive care specific to the treatment regimen The drug concentrations, fluid compatibility, administration routes, and timing of the medications to be used The patient's access for treatment and lifetime cumulative dose history, if applicable  The patient's medication allergies and previous infusion related reactions, if applicable   Changes made to treatment plan:  N/A  Follow up needed:  N/A   Barry Gray, [facility], 05/11/2022  12:32 PM

## 2022-05-12 ENCOUNTER — Telehealth: Payer: Self-pay | Admitting: Radiation Oncology

## 2022-05-12 LAB — PROTEIN ELECTROPHORESIS, SERUM, WITH REFLEX
A/G Ratio: 0.9 (ref 0.7–1.7)
Albumin ELP: 3.3 g/dL (ref 2.9–4.4)
Alpha-1-Globulin: 0.4 g/dL (ref 0.0–0.4)
Alpha-2-Globulin: 1.4 g/dL — ABNORMAL HIGH (ref 0.4–1.0)
Beta Globulin: 1.5 g/dL — ABNORMAL HIGH (ref 0.7–1.3)
Gamma Globulin: 0.4 g/dL (ref 0.4–1.8)
Globulin, Total: 3.7 g/dL (ref 2.2–3.9)
M-Spike, %: 0.6 g/dL — ABNORMAL HIGH
SPEP Interpretation: 0
Total Protein ELP: 7 g/dL (ref 6.0–8.5)

## 2022-05-12 LAB — IMMUNOFIXATION REFLEX, SERUM
IgA: 599 mg/dL — ABNORMAL HIGH (ref 61–437)
IgG (Immunoglobin G), Serum: 436 mg/dL — ABNORMAL LOW (ref 603–1613)
IgM (Immunoglobulin M), Srm: 41 mg/dL (ref 20–172)

## 2022-05-12 NOTE — Telephone Encounter (Signed)
6/28 @ 9:51 am Left voicemail on pt's # [phone] for pt to call our office to be scheduled for consult.  Also called home # no answer just rings.

## 2022-05-12 NOTE — Discharge Summary (Signed)
Patient ID: Barry Gray MRN: [id number] DOB/AGE: October 29, 1956 66 y.o.  Admit date: 04/28/2022 Discharge date: 04/29/2022  Admission Diagnoses:  Principal Problem:   Myelopathy Barry Gray)   Discharge Diagnoses:  Same  Past Medical History:  Diagnosis Date   Diabetes mellitus    Fracture of fibula, distal 10/2011   w/ mild posterior displacement of distal fracture fragments   Kidney stone    Multiple myeloma ([place]) 05/07/2022    Surgeries: Procedure(s): ANTERIOR CERVICAL DECOMPRESSION FUSION CERVICAL 5- CERVICAL 6, CERVICAL 6- CERVICAL 7, CERVICAL 7 - THORACIC 1 , Thoracic 1 - thoracic 2 WITH CERVICAL 7 CORPECTOMY WITH INSTRUMENTATION AND ALLOGRAFT POSTERIOR CERVICAL FUSION/FORAMINOTOMY LEVEL 3 on 04/28/2022   Consultants: None  Discharged Condition: Improved  Gray Course: Barry Gray is an 66 y.o. male who was admitted 04/28/2022 for operative treatment of Myelopathy ([place]). Patient has severe unremitting pain that affects sleep, daily activities, and work/hobbies. After pre-op clearance the patient was taken to the operating room on 04/28/2022 and underwent  Procedure(s): ANTERIOR CERVICAL DECOMPRESSION FUSION CERVICAL 5- CERVICAL 6, CERVICAL 6- CERVICAL 7, CERVICAL 7 - THORACIC 1 , Thoracic 1 - thoracic 2 WITH CERVICAL 7 CORPECTOMY WITH INSTRUMENTATION AND ALLOGRAFT POSTERIOR CERVICAL FUSION/FORAMINOTOMY LEVEL 3.    Patient was given perioperative antibiotics:  Anti-infectives (From admission, onward)    Start     Dose/Rate Route Frequency Ordered Stop   04/28/22 2000  ceFAZolin (ANCEF) IVPB 2g/100 mL premix        2 g 200 mL/hr over 30 Minutes Intravenous Every 8 hours 04/28/22 1837 04/29/22 0428   04/28/22 0700  ceFAZolin (ANCEF) IVPB 2g/100 mL premix        2 g 200 mL/hr over 30 Minutes Intravenous On call to O.R. 04/28/22 0654 04/28/22 0900        Patient was given sequential compression devices, early ambulation to prevent DVT.  Patient benefited  maximally from Gray stay and there were no complications.    Recent vital signs: BP 108/71 (BP Location: Right Arm)   Pulse (!) 109   Temp 97.7 F (36.5 C) (Oral)   Resp 17   Ht 5' 10"  (1.778 m)   Wt 83.9 kg   SpO2 100%   BMI 26.54 kg/m    Discharge Medications:   Allergies as of 04/29/2022   No Known Allergies      Medication List     TAKE these medications    B-12 PO Take 1 tablet by mouth daily.   Generlac 10 GM/15ML Soln Generic drug: lactulose (encephalopathy) Take 20 mls by mouth every 8 hours as needed   Lactulose 20 GM/30ML Soln Take 20 mLs (13.3333 g total) by mouth every 8 (eight) hours as needed.   lisinopril 10 MG tablet Commonly known as: ZESTRIL Take 10 mg by mouth daily as needed (blood pressure of 160/85).   metFORMIN 500 MG tablet Commonly known as: GLUCOPHAGE Take 500 mg by mouth 2 (two) times daily with a meal.   methocarbamol 500 MG tablet Commonly known as: ROBAXIN Take 1 tablet (500 mg total) by mouth every 6 (six) hours as needed for muscle spasms.   oxyCODONE-acetaminophen 10-325 MG tablet Commonly known as: PERCOCET Take 1 tablet by mouth every 4 (four) hours as needed for pain. What changed: Another medication with the same name was added. Make sure you understand how and when to take each.   oxyCODONE-acetaminophen 5-325 MG tablet Commonly known as: PERCOCET/ROXICET Take 1-2 tablets by mouth every 4 (four)  hours as needed for severe pain. What changed: You were already taking a medication with the same name, and this prescription was added. Make sure you understand how and when to take each.   polyethylene glycol 17 g packet Commonly known as: MIRALAX / GLYCOLAX Take 17 g by mouth daily as needed for moderate constipation.   VITAMIN D3 PO Take 1 tablet by mouth daily.        Diagnostic Studies: DG Cervical Spine 2 or 3 views  Result Date: 04/28/2022 CLINICAL DATA:  C5-T2 posterior cervical fusion with ACDF and  corpectomy. EXAM: CERVICAL SPINE - 2-3 VIEW COMPARISON:  MR cervical spine 04/23/2022 and CT cervical spine 04/16/2022. FINDINGS: Three intraoperative fluoroscopic spot views of the lower cervical and upper thoracic spine shows C5-T2 posterior cervical fusion with C7 corpectomy and strut placement. Anterior cervical fusion from C5-T1 with a C5-6 interbody spacer. Osseous detail is degraded by technique. IMPRESSION: Intraoperative visualization for C5-T2 posterior cervical interbody fusion, C7 corpectomy and C5-7 anterior cervical fusion. Electronically Signed   By: Lorin Picket M.D.   On: 04/28/2022 15:56   DG C-Arm 1-60 Min-No Report  Result Date: 04/28/2022 Fluoroscopy was utilized by the requesting physician.  No radiographic interpretation.   DG C-Arm 1-60 Min-No Report  Result Date: 04/28/2022 Fluoroscopy was utilized by the requesting physician.  No radiographic interpretation.   DG C-Arm 1-60 Min-No Report  Result Date: 04/28/2022 Fluoroscopy was utilized by the requesting physician.  No radiographic interpretation.   DG C-Arm 1-60 Min-No Report  Result Date: 04/28/2022 Fluoroscopy was utilized by the requesting physician.  No radiographic interpretation.   DG C-Arm 1-60 Min-No Report  Result Date: 04/28/2022 Fluoroscopy was utilized by the requesting physician.  No radiographic interpretation.   DG C-Arm 1-60 Min-No Report  Result Date: 04/28/2022 Fluoroscopy was utilized by the requesting physician.  No radiographic interpretation.   DG C-Arm 1-60 Min-No Report  Result Date: 04/28/2022 Fluoroscopy was utilized by the requesting physician.  No radiographic interpretation.   DG Cervical Spine 1 View  Result Date: 04/28/2022 CLINICAL DATA:  Localization film for C5 through T2 cervical fusion. EXAM: DG CERVICAL SPINE - 1 VIEW COMPARISON:  CT cervical spine 04/16/2022 FINDINGS: There is anterior plate and screw fixation hardware at the C5 through T1 levels. The more inferior  levels are difficult to visualized due to overlying shoulder bones and soft tissues. A surgical probe from posterior approach anterior superior tip overlies the posterior elements at the height of the C5-6 disc prosthesis Mild anterior C4-5 endplate osteophytes. IMPRESSION: Surgical probe tip terminates at the level of the C5-6 disc. Electronically Signed   By: Yvonne Kendall M.D.   On: 04/28/2022 14:21   DG Bone Survey Met  Result Date: 04/27/2022 CLINICAL DATA:  New diagnosis of myeloma. EXAM: METASTATIC BONE SURVEY COMPARISON:  MRI cervical spine 04/23/2022. CT cervical spine 04/16/2022. CT chest and abdomen 04/16/2022. FINDINGS: Standard imaging of the axial and appendicular skeleton obtained. Chest x-ray reveals bilateral nipple shadows. No acute cardiopulmonary disease identified. No definite focal skull lesions are identified. Known rib lesions best identified prior CT. Lower cervical spine is poorly visualized by plain film exam. Reference is made to prior recent MRI and CT reports, particularly for discussion of pathologic C7 process and multifocal spine lesions. A small lucency in the right clavicle cannot be excluded. Small lucency in the proximal left humerus cannot be excluded. Multiple small lucencies noted in the right humerus. Right acetabular lucencies noted. Proximal right femoral lucency noted.  Questionable small lucency in the proximal left femur. Diffuse osteopenia degenerative change. IMPRESSION: 1. Findings consistent with myeloma with multifocal bony involvement as above. 2. C7 is poorly visualized on plain film exam. Reference is made to cervical MRI and CT reports for discussion of pathologic process involving C7 and other multifocal spine lesions. Electronically Signed   By: Marcello Moores  Register M.D.   On: 04/27/2022 10:44   MR CERVICAL SPINE W WO CONTRAST  Result Date: 04/23/2022 CLINICAL DATA:  Weakness, numbness in hands, body, arms, and legs; right shoulder pain and neck EXAM: MRI  CERVICAL SPINE WITHOUT AND WITH CONTRAST TECHNIQUE: Multiplanar and multiecho pulse sequences of the cervical spine, to include the craniocervical junction and cervicothoracic junction, were obtained without and with intravenous contrast. CONTRAST:  79mL MULTIHANCE GADOBENATE DIMEGLUMINE 529 MG/ML IV SOLN COMPARISON:  04/02/2022 MRI cervical spine and CT cervical spine 04/16/2022 FINDINGS: Alignment: Trace retrolisthesis C5 on C6. Vertebrae: Redemonstrated complete height loss of C7, with a posterior bulging component that extends approximately 6-7 mm into the spinal canal, as seen on the prior exam and not significantly changed. The remainder of the vertebral body and the posterior aspect demonstrates increased T2 signal on STIR sequence and contrast enhancement (series 14, image 9 and series 7, image 9) abnormal signal extends into the bilateral posterior elements. Abnormal signal also extends circumferentially around the C6-C7 level extending approximately 1.0 cm anterior to the vertebral body (series 14, image 7) and up to 2 cm in either transverse direction (series 13, image 25). Abnormal signal is also noted to a lesser extent in the posteroinferior right aspect of C4 (series 14, image 5), inferior right facet of C5 (series 14, image 2), left posterior elements of C6 (series 14, image 6), right lateral inferior aspect of T1 (series 14, image 5 and series 8, image 31), posterosuperior left aspect of T2 (series 14, image 10), and the posterior elements of T3 (series 14, image 11). Cord: Normal spinal cord signal. Epidural extension at C7 causes severe spinal canal stenosis and compression of the spinal cord. No abnormal spinal cord enhancement. Posterior Fossa, vertebral arteries, paraspinal tissues: Possible mass effect on the esophagus secondary to expansile tumor at C7. Otherwise negative. Disc levels: C2-C3: No significant disc bulge. No spinal canal stenosis or neuroforaminal narrowing. C3-C4: No  significant disc bulge. No spinal canal stenosis or neuroforaminal narrowing. C4-C5: No significant disc bulge. No spinal canal stenosis or neuroforaminal narrowing. C5-C6: Small right subarticular disc protrusion. Mild spinal canal stenosis. No neural foraminal narrowing. C6-C7: Severe spinal canal stenosis and severe bilateral neural foraminal narrowing are again noted, secondary to extension of the previously noted C7 tumor. C7-T1: Severe spinal canal stenosis secondary to epidural extension of tumor with less compression of the spinal cord than at C6-C7. Similar severe bilateral neural foraminal stenosis. IMPRESSION: 1. Redemonstrated C7 vertebra plana tumor, overall similar to the prior exam, which extends into the bilateral posterior elements and demonstrates significant circumferential tumor extension. This causes severe spinal canal stenosis and compression of the spinal cord at both C6-C7 and C7-T1, without evidence of abnormal signal or enhancement in the spinal cord, as well as severe bilateral neural foraminal narrowing. 2. Additional abnormal signal is seen to a lesser extent in C4, C5, C6, T1, T2, and T3. Overall these findings are concerning for multiple myeloma versus metastatic disease. Electronically Signed   By: Merilyn Baba M.D.   On: 04/23/2022 16:21   CT CERVICAL SPINE WO CONTRAST  Addendum Date: 04/19/2022   ADDENDUM  REPORT: 04/19/2022 08:46 ADDENDUM: Dr. Elta Guadeloupe DUMONSKI was in consultation with this patient and I am advised he had read this report at the time of my phone call at 0836 hours today. We left a message with his medical assistant for Dr. Lynann Bologna to call with any questions. Electronically Signed   By: Genevie Ann M.D.   On: 04/19/2022 08:46   Result Date: 04/19/2022 CLINICAL DATA:  66 year old male with skeletal metastases. Pathologic fracture in the chest. Neck pain. Numbness in the bilateral 4th and 5th fingers. EXAM: CT CERVICAL SPINE WITHOUT CONTRAST TECHNIQUE: Multidetector CT  imaging of the cervical spine was performed without intravenous contrast. Multiplanar CT image reconstructions were also generated. RADIATION DOSE REDUCTION: This exam was performed according to the departmental dose-optimization program which includes automated exposure control, adjustment of the mA and/or kV according to patient size and/or use of iterative reconstruction technique. COMPARISON:  CT Chest, Abdomen, and Pelvis 04/16/2022. FINDINGS: Alignment: Relatively preserved cervical lordosis. Posterior element alignment remains satisfactory. Skull base and vertebrae: Visualized skull base is intact. No atlanto-occipital dissociation. C1 and C2 overall intact and aligned. But there is a subtle round lytic lesion in the right C2 body best seen on series 8, image 25. Severe C7 vertebra plana, subtotal osteolysis of the entire C7 vertebral body with only faint residual on series 7, image 30. Bilateral lytic tumor in the posterior elements also, right greater than left. See series 7, image 25. Both pedicles are destroyed. And there is strong evidence of bulky ventral epidural and foraminal tumor at that level (series 3, image 68). Smaller round lytic lesions elsewhere in the cervical spine including the right C4 body (series 8, image 22), central C6 body Soft tissues and spinal canal: Strong evidence of soft tissue tumor within the spinal canal at C7. No definite cervical spinal stenosis above that level. Negative other visible noncontrast neck soft tissues. Disc levels:  Abnormal C7 level as above. Upper chest: Upper thoracic posterior rib and vertebral body lytic lesions. No strong evidence of upper thoracic spinal canal tumor. Other: Negative visible noncontrast posterior fossa. Visible tympanic cavities, mastoids and sphenoid sinuses are clear. IMPRESSION: 1. Severe C7 vertebra plana lytic tumor in the bilateral posterior elements (right greater than left) and strong evidence of bulky ventral epidural tumor  within the spinal canal. Consider Malignant Spinal Cord Compression and recommend Cervical And Thoracic MRI without and with contrast. 2. Constellation favors Multiple Myeloma: with widespread smaller lytic lesions elsewhere in the cervical and visible upper thoracic spine. Electronically Signed: By: Genevie Ann M.D. On: 04/19/2022 08:27   CT CHEST ABDOMEN PELVIS W CONTRAST  Result Date: 04/18/2022 CLINICAL DATA:  Severe sharp neck pain concern for malignancy/invasive tumor. * Tracking Code: BO * EXAM: CT CHEST, ABDOMEN, AND PELVIS WITH CONTRAST TECHNIQUE: Multidetector CT imaging of the chest, abdomen and pelvis was performed following the standard protocol during bolus administration of intravenous contrast. RADIATION DOSE REDUCTION: This exam was performed according to the departmental dose-optimization program which includes automated exposure control, adjustment of the mA and/or kV according to patient size and/or use of iterative reconstruction technique. CONTRAST:  158mL ISOVUE-300 IOPAMIDOL (ISOVUE-300) INJECTION 61% COMPARISON:  MRI prostate Apr 03, 2018 and CT abdomen and pelvis January 26, 2012 FINDINGS: CT CHEST FINDINGS Cardiovascular: Normal caliber thoracic aorta. No central pulmonary embolus on this nondedicated study. Coronary artery calcifications. Normal size heart. No significant pericardial effusion/thickening. Mediastinum/Nodes: No supraclavicular adenopathy. No suspicious thyroid nodule. No pathologically enlarged mediastinal, hilar or axillary lymph nodes.  Lungs/Pleura: No suspicious pulmonary nodules or masses. No pleural effusion. No pneumothorax. Subsegmental atelectasis in the bilateral lung bases. Musculoskeletal: C7 vertebral plana with an associated soft tissue lesion which extends into the canal for instance on image 5/2 better seen and evaluated on same day CT cervical spine. Numerous scattered small lucent osseous foci for instance in the T1 posterior elements for instance on axial  image 9/3, T2 vertebral body on image 105/6, in the right scapula on image 12/2 and in the left second rib on image 22/3 with an associated pathologic fracture of the left second rib. CT ABDOMEN PELVIS FINDINGS Hepatobiliary: Subcentimeter hypodense hepatic lesion in the left lobe of the liver on image 52/2. Cholelithiasis without findings of acute cholecystitis. No biliary ductal dilation. Pancreas: No pancreatic ductal dilation or evidence of acute inflammation. Spleen: No splenomegaly or focal splenic lesion. Adrenals/Urinary Tract: Bilateral adrenal glands appear normal. Benign 2.4 cm cyst in the right kidney. No hydronephrosis. Kidneys demonstrate symmetric enhancement and excretion of contrast material. Urinary bladder is unremarkable for degree of distension. Stomach/Bowel: Radiopaque enteric contrast material traverses the cecum. Tiny hiatal hernia otherwise the stomach is unremarkable for degree of distension. No pathologic dilation of small or large bowel. The appendix and terminal ileum appear normal. Left-sided colonic diverticulosis without findings of acute diverticulitis. Vascular/Lymphatic: No pathologically enlarged abdominal or pelvic lymph nodes. Normal caliber abdominal aorta. Mild aortic atherosclerosis. Reproductive: Heterogeneous enhancement of an enlarged prostate gland. Other: No significant abdominopelvic free fluid. Musculoskeletal: Numerous scattered small lucent osseous lesions for instance in the L2 vertebral body on image 99/6, L4 spinous process on image 101/6 and right acetabulum on image 72/6. IMPRESSION: 1. Numerous lucent lesions involving the axial and proximal appendicular skeleton with vertebra plana of the C7 vertebral body and an associated soft tissue lesion at this level which extends into the right neural foramina and vertebral canal, findings which are suspicious for multiple myeloma with a possible plasmacytoma at the C7 vertebral body level, recommend correlation with  UPEP/SPEP and cervical/thoracic/lumbar spine MRI with and without contrast. 2. Pathologic fracture of the left second rib with associated lucent lesion. 3. No adenopathy in the chest, abdomen or pelvis. 4. Subcentimeter hypodense hepatic lesion in the left lobe of the liver, technically too small to accurately characterize. Suggest attention on follow-up imaging. 5. Heterogeneous enhancement of an enlarged prostate gland. 6.  Aortic Atherosclerosis (ICD10-I70.0). These results will be called to the ordering clinician or representative by the Radiologist Assistant, and communication documented in the PACS or [employer]. Electronically Signed   By: Dahlia Bailiff M.D.   On: 04/18/2022 17:07    Disposition: Discharge disposition: 01-Home or Self Care      POD #1 s/p C5-T2 decompression and fusion, doing well, with significant improvements in strength, not even 24 hours after surgery   - up with PT/OT, encourage ambulation - Percocet for pain, Robaxin for muscle spasms - likely d/c home today with f/u in 2 weeks.  The patient is comfortable with this plan.  I do anticipate he will likely need to go home with home health physical therapy. - Given his history of prostatic hypertrophy, I do feel it would likely be wise to discharge him with a Foley catheter, as he has been unable to void after 2 trials.  He will need close follow-up with his urologist.  We will try to arrange follow-up next week with his urologist. -F/U in office 2 weeks   Signed: Justice Britain 05/12/2022, 3:29 PM

## 2022-05-13 ENCOUNTER — Telehealth: Payer: Self-pay | Admitting: Urology

## 2022-05-13 ENCOUNTER — Other Ambulatory Visit: Payer: Self-pay

## 2022-05-13 ENCOUNTER — Emergency Department (HOSPITAL_COMMUNITY)
Admission: EM | Admit: 2022-05-13 | Discharge: 2022-05-13 | Disposition: A | Discharge status: Home / Self Care | Payer: Medicare Other | Attending: Emergency Medicine | Admitting: Emergency Medicine

## 2022-05-13 DIAGNOSIS — R600 Localized edema: Secondary | ICD-10-CM | POA: Insufficient documentation

## 2022-05-13 DIAGNOSIS — T83011A Breakdown (mechanical) of indwelling urethral catheter, initial encounter: Secondary | ICD-10-CM

## 2022-05-13 DIAGNOSIS — Z7982 Long term (current) use of aspirin: Secondary | ICD-10-CM | POA: Insufficient documentation

## 2022-05-13 DIAGNOSIS — R3915 Urgency of urination: Secondary | ICD-10-CM | POA: Diagnosis present

## 2022-05-13 DIAGNOSIS — T83098A Other mechanical complication of other indwelling urethral catheter, initial encounter: Secondary | ICD-10-CM | POA: Diagnosis not present

## 2022-05-13 LAB — I-STAT CHEM 8, ED
BUN: 15 mg/dL (ref 8–23)
Calcium, Ion: 1.17 mmol/L (ref 1.15–1.40)
Chloride: 107 mmol/L (ref 98–111)
Creatinine, Ser: 1 mg/dL (ref 0.61–1.24)
Glucose, Bld: 166 mg/dL — ABNORMAL HIGH (ref 70–99)
HCT: 33 % — ABNORMAL LOW (ref 39.0–52.0)
Hemoglobin: 11.2 g/dL — ABNORMAL LOW (ref 13.0–17.0)
Potassium: 3.9 mmol/L (ref 3.5–5.1)
Sodium: 141 mmol/L (ref 135–145)
TCO2: 21 mmol/L — ABNORMAL LOW (ref 22–32)

## 2022-05-13 LAB — URINALYSIS, ROUTINE W REFLEX MICROSCOPIC
Bilirubin Urine: NEGATIVE
Glucose, UA: NEGATIVE mg/dL
Ketones, ur: NEGATIVE mg/dL
Nitrite: NEGATIVE
Protein, ur: 100 mg/dL — AB
RBC / HPF: >50 RBC/hpf — ABNORMAL HIGH (ref 0–5)
Specific Gravity, Urine: 1.014 (ref 1.005–1.030)
pH: 5 (ref 5.0–8.0)

## 2022-05-13 MED ORDER — CEPHALEXIN 500 MG PO CAPS: 500.0000 mg | ORAL_CAPSULE | Freq: Three times a day (TID) | ORAL | 0 refills | Status: DC

## 2022-05-13 MED ORDER — CEPHALEXIN 500 MG PO CAPS
500.0000 mg | ORAL_CAPSULE | Freq: Once | ORAL | Status: AC
  Administered 2022-05-13: 500 mg via ORAL
  Filled 2022-05-13: qty 1

## 2022-05-13 MED ORDER — LACTATED RINGERS IV BOLUS
1000.0000 mL | Freq: Once | INTRAVENOUS | Status: AC
  Administered 2022-05-13: 1000 mL via INTRAVENOUS

## 2022-05-13 NOTE — ED Notes (Signed)
Bladder scan 444. Foley removed. Pt was able to urinate approx 130cc out. Pt would like more time to void. Would like to avoid new foley.

## 2022-05-13 NOTE — ED Notes (Signed)
Post tibial pulses 2+ bilat. left dp audible on doppler. Unable to doppler r dp. Provider aware.

## 2022-05-13 NOTE — Telephone Encounter (Signed)
Returned patient call. Recently had catheter placed for neck surgery and subsequently failed trial of void so catheter was replaced. Failed clinic TOV with Dr. Louis Meckel shortly after. Calls this morning with poorly draining catheter with associated urgency and suprapubic pressure. There is some leakage of urine around catheter but minimal drainage into bag. No other symptoms. Did have multiple episodes of hematuria since failed trial of void. I am concerned about possible obstruction of catheter. They do not have flushing supplies at home and so I recommended they come to [facility] for evaluation including bladder scan and flushing of catheter to regain flow.   All questions answered, they were in agreement with the plan

## 2022-05-13 NOTE — Treatment Plan (Signed)
Returned patient call. Recently had catheter placed for neck surgery and subsequently failed trial of void so catheter was replaced. Failed clinic TOV with Dr. Louis Meckel shortly after. Calls this morning with poorly draining catheter with associated urgency and suprapubic pressure. There is some leakage of urine around catheter but minimal drainage into bag. No other symptoms. Did have multiple episodes of hematuria since failed trial of void. I am concerned about possible obstruction of catheter. They do not have flushing supplies at home and so I recommended they come to [facility] for evaluation including bladder scan and flushing of catheter to regain flow.  All questions answered, they were in agreement with the plan

## 2022-05-13 NOTE — ED Triage Notes (Signed)
Patient coming to ED for evaluation of complications with foley catheter.  Reports he had catheter placed one week ago.  Last night around 7 PM he began to notice blood in bag.  Reports he has noticed decreased urine output and leaking around catheter.  States "I think it is stopped up."

## 2022-05-13 NOTE — ED Provider Notes (Addendum)
[facility] DEPT Provider Note   CSN: [id number] Arrival date & time: 05/13/22  2202     History  Chief Complaint  Patient presents with   Foley Catheter Problem    Barry Gray is a 66 y.o. male.  Patient with recent cervical fusion June 14 due to myelopathy and concern for multiple myeloma.  He has had a urinary catheter in place since then.  He failed a voiding trial 1 week ago.  He states he feels like his Foley catheter is blocked again since last night.  Reports urge to urinate but is unable to.  Had some blood in the bag last night as well as decreased urine output and leaking around the catheter today near his penis.  He called urology and was referred to the ED for evaluation.  Denies any fevers, chills, nausea or vomiting.  Has bladder pain but no flank pain.  No chest pain or shortness of breath.  No testicular pain. No chemo or radiation currently  The history is provided by the patient and the spouse.       Home Medications Prior to Admission medications   Medication Sig Start Date End Date Taking? Authorizing Provider  aspirin 325 MG tablet Take 325 mg by mouth daily.    [provider]  Cholecalciferol (VITAMIN D3 PO) Take 1 tablet by mouth daily.    [provider]  Cyanocobalamin (B-12 PO) Take 1 tablet by mouth daily.    [provider]  dexamethasone (DECADRON) 4 MG tablet Take 5 tablets (20 mg total) by mouth once a week. Start on 05/20/2022 05/07/22   Volanda Napoleon, MD  famciclovir (FAMVIR) 250 MG tablet Take 1 tablet (250 mg total) by mouth daily. 05/07/22   Volanda Napoleon, MD  Lactulose 20 GM/30ML SOLN Take 20 mLs (13.3333 g total) by mouth every 8 (eight) hours as needed. 04/26/22   Volanda Napoleon, MD  lactulose, encephalopathy, (CHRONULAC) 10 GM/15ML SOLN Take 20 mls by mouth every 8 hours as needed 04/26/22   Volanda Napoleon, MD  lenalidomide (REVLIMID) 25 MG capsule Take one capsule by  mouth for 21 days ON, then OFF for seven days. [employer] Auth # [id number]  Date Obtained 05/07/22 05/07/22   Volanda Napoleon, MD  lisinopril (ZESTRIL) 10 MG tablet Take 10 mg by mouth daily as needed (blood pressure of 160/85). Patient not taking: Reported on 04/26/2022 04/16/22   [provider]  LORazepam (ATIVAN) 0.5 MG tablet Take 1 tablet (0.5 mg total) by mouth every 6 (six) hours as needed (Nausea or vomiting). 05/07/22   Volanda Napoleon, MD  metFORMIN (GLUCOPHAGE) 500 MG tablet Take 500 mg by mouth 2 (two) times daily with a meal.     [provider]  methocarbamol (ROBAXIN) 500 MG tablet Take 1 tablet (500 mg total) by mouth every 6 (six) hours as needed for muscle spasms. 04/29/22   Phylliss Bob, MD  montelukast (SINGULAIR) 10 MG tablet Take 1 tablet (10 mg total) by mouth at bedtime. Start Singulair 2 days before you start the chemotherapy in July. 05/10/22   Volanda Napoleon, MD  ondansetron (ZOFRAN) 8 MG tablet Take 1 tablet (8 mg total) by mouth 2 (two) times daily as needed (Nausea or vomiting). 05/07/22   Volanda Napoleon, MD  oxyCODONE-acetaminophen (PERCOCET) 10-325 MG tablet Take 1 tablet by mouth every 4 (four) hours as needed for pain. 04/19/22   [provider]  oxyCODONE-acetaminophen (PERCOCET/ROXICET)  5-325 MG tablet Take 1-2 tablets by mouth every 4 (four) hours as needed for severe pain. 04/29/22   Phylliss Bob, MD  polyethylene glycol (MIRALAX / GLYCOLAX) 17 g packet Take 17 g by mouth daily as needed for moderate constipation.    [provider]  prochlorperazine (COMPAZINE) 10 MG tablet Take 1 tablet (10 mg total) by mouth every 6 (six) hours as needed (Nausea or vomiting). 05/07/22   Volanda Napoleon, MD  tamsulosin (FLOMAX) 0.4 MG CAPS capsule Take 0.4 mg by mouth daily. 05/06/22   [provider]      Allergies    Patient has no known allergies.    Review of Systems   Review of Systems  Constitutional:  Negative for activity  change, appetite change and fever.  Respiratory:  Negative for cough, chest tightness and shortness of breath.   Gastrointestinal:  Negative for abdominal pain, nausea and vomiting.  Genitourinary:  Positive for decreased urine volume, difficulty urinating and dysuria. Negative for flank pain and testicular pain.  Skin:  Negative for rash.  Neurological:  Negative for dizziness, weakness and headaches.   all other systems are negative except as noted in the HPI and PMH.    Physical Exam Updated Vital Signs BP 126/86 (BP Location: Right Arm)   Pulse 87   Temp 98.2 F (36.8 C) (Oral)   Resp 16   Ht 5' 10"  (1.778 m)   Wt 81.6 kg   SpO2 100%   BMI 25.83 kg/m  Physical Exam Vitals and nursing note reviewed.  Constitutional:      General: He is not in acute distress.    Appearance: He is well-developed.  HENT:     Head: Normocephalic and atraumatic.     Mouth/Throat:     Pharynx: No oropharyngeal exudate.  Eyes:     Conjunctiva/sclera: Conjunctivae normal.     Pupils: Pupils are equal, round, and reactive to light.  Neck:     Comments: No meningismus. Cardiovascular:     Rate and Rhythm: Normal rate and regular rhythm.     Heart sounds: Normal heart sounds. No murmur heard. Pulmonary:     Effort: Pulmonary effort is normal. No respiratory distress.     Breath sounds: Normal breath sounds.  Abdominal:     Palpations: Abdomen is soft.     Tenderness: There is no abdominal tenderness. There is no guarding or rebound.     Comments: Palpable bladder  Genitourinary:    Comments: Foley in place. Sediment in bag. no testicular pain. Musculoskeletal:        General: No tenderness. Normal range of motion.     Cervical back: Normal range of motion and neck supple.     Right lower leg: Edema present.     Left lower leg: Edema present.     Comments: Intact PT pulses bilaterally  Skin:    General: Skin is warm.  Neurological:     Mental Status: He is alert and oriented to person,  place, and time.     Cranial Nerves: No cranial nerve deficit.     Motor: No abnormal muscle tone.     Coordination: Coordination normal.     Comments:  5/5 strength throughout. CN 2-12 intact.Equal grip strength.   Psychiatric:        Behavior: Behavior normal.     ED Results / Procedures / Treatments   Labs (all labs ordered are listed, but only abnormal results are displayed) Labs Reviewed  URINALYSIS,  ROUTINE W REFLEX MICROSCOPIC - Abnormal; Notable for the following components:      Result Value   Color, Urine YELLOW (*)    APPearance TURBID (*)    Hgb urine dipstick LARGE (*)    Protein, ur 100 (*)    Leukocytes,Ua SMALL (*)    RBC / HPF >50 (*)    Bacteria, UA MANY (*)    All other components within normal limits  I-STAT CHEM 8, ED - Abnormal; Notable for the following components:   Glucose, Bld 166 (*)    TCO2 21 (*)    Hemoglobin 11.2 (*)    HCT 33.0 (*)    All other components within normal limits  URINE CULTURE    EKG None  Radiology No results found.  Procedures Procedures    Medications Ordered in ED Medications - No data to display  ED Course/ Medical Decision Making/ A&P                           Medical Decision Making Amount and/or Complexity of Data Reviewed Labs: ordered. Decision-making details documented in ED Course. Radiology: ordered and independent interpretation performed. Decision-making details documented in ED Course. ECG/medicine tests: ordered and independent interpretation performed. Decision-making details documented in ED Course.  Risk Prescription drug management.  Foley catheter malfunction. Stable vitals, no fever.   Attempted to irrigate catheter without success.  We will replace  Bladder scan 444 cc.  Patient able to urinate 130 after catheter removal.  He does not want a new catheter.  Discussed with him that urinary retention is likely to recur again.  Patient able to urinate multiple times small amounts.  He  states he feels no bladder pressure.  Repeat bladder scan shows 130 cc.  He really prefers not to have another Foley catheter.  Discussed with the patient that urinary retention may recur and he may need to return for a catheter as urinary retention can potentially cause kidney damage.  Urinalysis concerning for infection.  Will initiate antibiotics while culture is pending.  Patient denies any abdominal pain or flank pain.  No fever or vomiting.  Discussed follow-up with urology.  Return to the ED with worsening pain, fever, vomiting, unable to urinate or other concerns.  Patient and wife aware that he may require catheter again.        Final Clinical Impression(s) / ED Diagnoses Final diagnoses:  Malfunction of Foley catheter, initial encounter [facility])    Rx / DC Orders ED Discharge Orders     None         Zenola Dezarn, Annie Main, MD 05/13/22 1520    Ezequiel Essex, MD 05/13/22 1520

## 2022-05-13 NOTE — Discharge Instructions (Addendum)
As we discussed, you may require catheter again which you declined today.  Follow-up with the urologist.  Take the antibiotics as prescribed.  Return to the ED if not able to urinate, abdominal pain, fever, vomiting or any other concerns.

## 2022-05-15 DIAGNOSIS — Z5112 Encounter for antineoplastic immunotherapy: Secondary | ICD-10-CM | POA: Insufficient documentation

## 2022-05-15 DIAGNOSIS — C9000 Multiple myeloma not having achieved remission: Secondary | ICD-10-CM | POA: Insufficient documentation

## 2022-05-15 DIAGNOSIS — Z79899 Other long term (current) drug therapy: Secondary | ICD-10-CM | POA: Insufficient documentation

## 2022-05-15 LAB — URINE CULTURE: Culture: >=100000 — AB

## 2022-05-16 ENCOUNTER — Telehealth (HOSPITAL_BASED_OUTPATIENT_CLINIC_OR_DEPARTMENT_OTHER): Payer: Self-pay | Admitting: *Deleted

## 2022-05-16 NOTE — Progress Notes (Signed)
ED Antimicrobial Stewardship Positive Culture Follow Up   Barry Gray is an 66 y.o. male who presented to [facility] on 05/13/2022 with a chief complaint of  Chief Complaint  Patient presents with   Foley Catheter Problem    Recent Results (from the past 720 hour(s))  Surgical pcr screen     Status: None   Collection Time: 04/22/22  8:35 AM   Specimen: Nasal Mucosa; Nasal Swab  Result Value Ref Range Status   MRSA, PCR NEGATIVE NEGATIVE Final   Staphylococcus aureus NEGATIVE NEGATIVE Final    Comment: (NOTE) The Xpert SA Assay (FDA approved for NASAL specimens in patients 53 years of age and older), is one component of a comprehensive surveillance program. It is not intended to diagnose infection nor to guide or monitor treatment. Performed at [facility], [place] [address] [place], [place] [address]   Urine Culture     Status: Abnormal   Collection Time: 05/13/22  8:20 AM   Specimen: Urine, Clean Catch  Result Value Ref Range Status   Specimen Description   Final    URINE, CLEAN CATCH Performed at [facility], [place] [address], [place], [place] [address]    Special Requests   Final    NONE Performed at [facility], [place] [address], [place], [place] [address]    Culture >=100,000 COLONIES/mL ENTEROCOCCUS FAECALIS (A)  Final   Report Status 05/15/2022 FINAL  Final   Organism ID, Bacteria ENTEROCOCCUS FAECALIS (A)  Final      Susceptibility   Enterococcus faecalis - MIC*    AMPICILLIN <=2 SENSITIVE Sensitive     NITROFURANTOIN <=16 SENSITIVE Sensitive     VANCOMYCIN 1 SENSITIVE Sensitive     * >=100,000 COLONIES/mL ENTEROCOCCUS FAECALIS    '[x]'$  Treated with Cephalexin, organism resistant to prescribed antimicrobial '[]'$  Patient discharged originally without antimicrobial agent and treatment is now indicated  New antibiotic prescription: Amoxicillin '500mg'$  PO BID for 5 days total.    ED Provider: Tegeler, Gwenyth Allegra,  MD   Gretta Arab PharmD, BCPS Clinical Pharmacist [facility] main pharmacy [phone] 05/16/2022 1:04 PM

## 2022-05-16 NOTE — Telephone Encounter (Signed)
Post ED Visit - Positive Culture Follow-up: Successful Patient Follow-Up  Culture assessed and recommendations reviewed by:  '[]'$  Elenor Quinones, Pharm.D. '[]'$  Heide Guile, Pharm.D., BCPS AQ-ID '[]'$  Parks Neptune, Pharm.D., BCPS '[]'$  Alycia Rossetti, Pharm.D., BCPS '[]'$  [place]ia [place], Pharm.D., BCPS, AAHIVP '[]'$  Legrand Como, Pharm.D., BCPS, AAHIVP '[]'$  Salome Arnt, PharmD, BCPS '[]'$  Johnnette Gourd, PharmD, BCPS '[]'$  Hughes Better, PharmD, BCPS '[x]'$  Gretta Arab, PharmD  Positive urine culture  '[]'$  Patient discharged without antimicrobial prescription and treatment is now indicated '[x]'$  Organism is resistant to prescribed ED discharge antimicrobial '[]'$  Patient with positive blood cultures  Changes discussed with ED provider: Marda Stalker New antibiotic prescription Amoxicillin '500mg'$  PO BID x 5 days total Called to [facility]. [place], [place]  Contacted patient, date 05/16/22, time 1606   [name] 05/16/2022, 4:06 PM

## 2022-05-19 ENCOUNTER — Other Ambulatory Visit: Payer: Self-pay

## 2022-05-19 ENCOUNTER — Other Ambulatory Visit (HOSPITAL_COMMUNITY): Payer: Medicare Other

## 2022-05-19 DIAGNOSIS — Z79899 Other long term (current) drug therapy: Secondary | ICD-10-CM | POA: Diagnosis not present

## 2022-05-19 DIAGNOSIS — C9000 Multiple myeloma not having achieved remission: Secondary | ICD-10-CM | POA: Diagnosis not present

## 2022-05-19 DIAGNOSIS — G959 Disease of spinal cord, unspecified: Secondary | ICD-10-CM

## 2022-05-19 DIAGNOSIS — Z5112 Encounter for antineoplastic immunotherapy: Secondary | ICD-10-CM | POA: Diagnosis present

## 2022-05-19 NOTE — Progress Notes (Signed)
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    If Spine Met(s), symptoms, if any, include: Bowel/Bladder retention or incontinence (please describe): has some constipation. Numbness or weakness in extremities (please describe): yes - lower legs for the past month Current Decadron regimen, if applicable: takes 20 mg weekly  Ambulatory status? Walker? Wheelchair?: uses a walker at home.  Ambulatory today.  SAFETY ISSUES: Prior radiation? no Pacemaker/ICD? no Possible current pregnancy? no Is the patient on methotrexate? no  Current Complaints / other details:  Patient is not able to lay flat due to his cervical collar.  He will go back to the surgeon for an appointment on 06/04/22.   BP 125/77   Pulse (!) 103   Temp (!) 97.5 F (36.4 C)   Resp 20   Ht _0  (1.778 m)   Wt 183 lb 9.6 oz (83.3 kg)   SpO2 100%   BMI 26.34 kg/m

## 2022-05-20 ENCOUNTER — Other Ambulatory Visit: Payer: Self-pay | Admitting: *Deleted

## 2022-05-20 ENCOUNTER — Inpatient Hospital Stay: Payer: Medicare Other

## 2022-05-20 ENCOUNTER — Encounter: Payer: Self-pay | Admitting: *Deleted

## 2022-05-20 ENCOUNTER — Inpatient Hospital Stay: Payer: Medicare Other | Attending: Hematology & Oncology

## 2022-05-20 ENCOUNTER — Other Ambulatory Visit (HOSPITAL_BASED_OUTPATIENT_CLINIC_OR_DEPARTMENT_OTHER): Payer: Self-pay

## 2022-05-20 VITALS — BP 128/64 | HR 100 | Temp 98.2°F | Resp 17

## 2022-05-20 DIAGNOSIS — C9000 Multiple myeloma not having achieved remission: Secondary | ICD-10-CM

## 2022-05-20 DIAGNOSIS — Z5112 Encounter for antineoplastic immunotherapy: Secondary | ICD-10-CM | POA: Diagnosis not present

## 2022-05-20 DIAGNOSIS — G959 Disease of spinal cord, unspecified: Secondary | ICD-10-CM

## 2022-05-20 LAB — CBC WITH DIFFERENTIAL (CANCER CENTER ONLY)
Abs Immature Granulocytes: 0.04 10*3/uL (ref 0.00–0.07)
Basophils Absolute: 0 10*3/uL (ref 0.0–0.1)
Basophils Relative: 1 %
Eosinophils Absolute: 0.1 10*3/uL (ref 0.0–0.5)
Eosinophils Relative: 1 %
HCT: 32.8 % — ABNORMAL LOW (ref 39.0–52.0)
Hemoglobin: 10.7 g/dL — ABNORMAL LOW (ref 13.0–17.0)
Immature Granulocytes: 1 %
Lymphocytes Relative: 21 %
Lymphs Abs: 1.7 10*3/uL (ref 0.7–4.0)
MCH: 30.3 pg (ref 26.0–34.0)
MCHC: 32.6 g/dL (ref 30.0–36.0)
MCV: 92.9 fL (ref 80.0–100.0)
Monocytes Absolute: 0.6 10*3/uL (ref 0.1–1.0)
Monocytes Relative: 8 %
Neutro Abs: 5.5 10*3/uL (ref 1.7–7.7)
Neutrophils Relative %: 68 %
Platelet Count: 366 10*3/uL (ref 150–400)
RBC: 3.53 MIL/uL — ABNORMAL LOW (ref 4.22–5.81)
RDW: 13.2 % (ref 11.5–15.5)
WBC Count: 8 10*3/uL (ref 4.0–10.5)
nRBC: 0 % (ref 0.0–0.2)

## 2022-05-20 LAB — CMP (CANCER CENTER ONLY)
ALT: 9 U/L (ref 0–44)
AST: 10 U/L — ABNORMAL LOW (ref 15–41)
Albumin: 4.1 g/dL (ref 3.5–5.0)
Alkaline Phosphatase: 63 U/L (ref 38–126)
Anion gap: 12 (ref 5–15)
BUN: 10 mg/dL (ref 8–23)
CO2: 24 mmol/L (ref 22–32)
Calcium: 9.6 mg/dL (ref 8.9–10.3)
Chloride: 103 mmol/L (ref 98–111)
Creatinine: 1.16 mg/dL (ref 0.61–1.24)
GFR, Estimated: >60 mL/min (ref >60)
Glucose, Bld: 174 mg/dL — ABNORMAL HIGH (ref 70–99)
Potassium: 4.1 mmol/L (ref 3.5–5.1)
Sodium: 139 mmol/L (ref 135–145)
Total Bilirubin: 0.6 mg/dL (ref 0.3–1.2)
Total Protein: 7.1 g/dL (ref 6.5–8.1)

## 2022-05-20 MED ORDER — BORTEZOMIB CHEMO SQ INJECTION 3.5 MG (2.5MG/ML)
1.3000 mg/m2 | Freq: Once | INTRAMUSCULAR | Status: AC
  Administered 2022-05-20: 2.75 mg via SUBCUTANEOUS
  Filled 2022-05-20: qty 1.1

## 2022-05-20 MED ORDER — DIPHENHYDRAMINE HCL 25 MG PO CAPS
50.0000 mg | ORAL_CAPSULE | Freq: Once | ORAL | Status: AC
  Administered 2022-05-20: 50 mg via ORAL
  Filled 2022-05-20: qty 2

## 2022-05-20 MED ORDER — DEXAMETHASONE 4 MG PO TABS
20.0000 mg | ORAL_TABLET | ORAL | 3 refills | Status: DC
  Filled 2022-05-20: qty 5, 7d supply, fill #0
  Filled 2022-05-20: qty 55, 77d supply, fill #0
  Filled 2022-05-20: qty 60, 84d supply, fill #0

## 2022-05-20 MED ORDER — DARATUMUMAB-HYALURONIDASE-FIHJ 1800-30000 MG-UT/15ML ~~LOC~~ SOLN
1800.0000 mg | Freq: Once | SUBCUTANEOUS | Status: AC
  Administered 2022-05-20: 1800 mg via SUBCUTANEOUS
  Filled 2022-05-20: qty 15

## 2022-05-20 MED ORDER — ACETAMINOPHEN 325 MG PO TABS
650.0000 mg | ORAL_TABLET | Freq: Once | ORAL | Status: AC
  Administered 2022-05-20: 650 mg via ORAL
  Filled 2022-05-20: qty 2

## 2022-05-20 MED ORDER — MONTELUKAST SODIUM 10 MG PO TABS
10.0000 mg | ORAL_TABLET | Freq: Once | ORAL | Status: AC
  Administered 2022-05-20: 10 mg via ORAL
  Filled 2022-05-20: qty 1

## 2022-05-20 MED ORDER — DEXAMETHASONE 4 MG PO TABS
20.0000 mg | ORAL_TABLET | Freq: Once | ORAL | Status: AC
  Administered 2022-05-20: 20 mg via ORAL
  Filled 2022-05-20: qty 5

## 2022-05-20 NOTE — Patient Instructions (Signed)
Daratumumab injection ?What is this medication? ?DARATUMUMAB (dar a toom ue mab) is a monoclonal antibody. It is used to treat multiple myeloma. ?This medicine may be used for other purposes; ask your health care provider or pharmacist if you have questions. ?COMMON BRAND NAME(S): DARZALEX ?What should I tell my care team before I take this medication? ?They need to know if you have any of these conditions: ?hereditary fructose intolerance ?infection (especially a virus infection such as chickenpox, herpes, or hepatitis B virus) ?lung or breathing disease (asthma, COPD) ?an unusual or allergic reaction to daratumumab, sorbitol, other medicines, foods, dyes, or preservatives ?pregnant or trying to get pregnant ?breast-feeding ?How should I use this medication? ?This medicine is for infusion into a vein. It is given by a health care professional in a hospital or clinic setting. ?Talk to your pediatrician regarding the use of this medicine in children. Special care may be needed. ?Overdosage: If you think you have taken too much of this medicine contact a poison control center or emergency room at once. ?NOTE: This medicine is only for you. Do not share this medicine with others. ?What if I miss a dose? ?Keep appointments for follow-up doses as directed. It is important not to miss your dose. Call your doctor or health care professional if you are unable to keep an appointment. ?What may interact with this medication? ?Interactions have not been studied. ?This list may not describe all possible interactions. Give your health care provider a list of all the medicines, herbs, non-prescription drugs, or dietary supplements you use. Also tell them if you smoke, drink alcohol, or use illegal drugs. Some items may interact with your medicine. ?What should I watch for while using this medication? ?Your condition will be monitored carefully while you are receiving this medicine. ?This medicine can cause serious allergic  reactions. To reduce your risk, your health care provider may give you other medicine to take before receiving this one. Be sure to follow the directions from your health care provider. ?This medicine can affect the results of blood tests to match your blood type. These changes can last for up to 6 months after the final dose. Your healthcare provider will do blood tests to match your blood type before you start treatment. Tell all of your healthcare providers that you are being treated with this medicine before receiving a blood transfusion. ?This medicine can affect the results of some tests used to determine treatment response; extra tests may be needed to evaluate response. ?Do not become pregnant while taking this medicine or for 3 months after stopping it. Women should inform their health care provider if they wish to become pregnant or think they might be pregnant. There is a potential for serious side effects to an unborn child. Talk to your health care provider for more information. Do not breast-feed an infant while taking this medicine. ?What side effects may I notice from receiving this medication? ?Side effects that you should report to your care team as soon as possible: ?Allergic reactions--skin rash, itching or hives, swelling of the face, lips, or tongue ?Blurred vision ?Infection--fever, chills, cough, sore throat, pain or trouble passing urine ?Infusion reactions--dizziness, fast heartbeat, feeling faint or lightheaded, falls, headache, increase in blood pressure, nausea, vomiting, or wheezing or trouble breathing with loud or whistling sounds ?Unusual bleeding or bruising ?Side effects that usually do not require medical attention (report to your care team if they continue or are bothersome): ?Constipation ?Diarrhea ?Pain, tingling, numbness in the hands   or feet Swelling of the ankles, feet, hands Tiredness This list may not describe all possible side effects. Call your doctor for medical  advice about side effects. You may report side effects to FDA at 1-800-FDA-1088. Where should I keep my medication? This drug is given in a hospital or clinic and will not be stored at home. NOTE: This sheet is a summary. It may not cover all possible information. If you have questions about this medicine, talk to your doctor, pharmacist, or health care provider.  2023 Elsevier/Gold Standard (2021-04-09 00:00:00)  Bortezomib injection What is this medication? BORTEZOMIB (bor TEZ oh mib) targets proteins in cancer cells and stops the cancer cells from growing. It treats multiple myeloma and mantle cell lymphoma. This medicine may be used for other purposes; ask your health care provider or pharmacist if you have questions. COMMON BRAND NAME(S): Velcade What should I tell my care team before I take this medication? They need to know if you have any of these conditions: dehydration diabetes (high blood sugar) heart disease liver disease tingling of the fingers or toes or other nerve disorder an unusual or allergic reaction to bortezomib, mannitol, boron, other medicines, foods, dyes, or preservatives pregnant or trying to get pregnant breast-feeding How should I use this medication? This medicine is injected into a vein or under the skin. It is given by a health care provider in a hospital or clinic setting. Talk to your health care provider about the use of this medicine in children. Special care may be needed. Overdosage: If you think you have taken too much of this medicine contact a poison control center or emergency room at once. NOTE: This medicine is only for you. Do not share this medicine with others. What if I miss a dose? Keep appointments for follow-up doses. It is important not to miss your dose. Call your health care provider if you are unable to keep an appointment. What may interact with this medication? This medicine may interact with the following  medications: ketoconazole rifampin This list may not describe all possible interactions. Give your health care provider a list of all the medicines, herbs, non-prescription drugs, or dietary supplements you use. Also tell them if you smoke, drink alcohol, or use illegal drugs. Some items may interact with your medicine. What should I watch for while using this medication? Your condition will be monitored carefully while you are receiving this medicine. You may need blood work done while you are taking this medicine. You may get drowsy or dizzy. Do not drive, use machinery, or do anything that needs mental alertness until you know how this medicine affects you. Do not stand up or sit up quickly, especially if you are an older patient. This reduces the risk of dizzy or fainting spells This medicine may increase your risk of getting an infection. Call your health care provider for advice if you get a fever, chills, sore throat, or other symptoms of a cold or flu. Do not treat yourself. Try to avoid being around people who are sick. Check with your health care provider if you have severe diarrhea, nausea, and vomiting, or if you sweat a lot. The loss of too much body fluid may make it dangerous for you to take this medicine. Do not become pregnant while taking this medicine or for 7 months after stopping it. Women should inform their health care provider if they wish to become pregnant or think they might be pregnant. Men should not father a child  while taking this medicine and for 4 months after stopping it. There is a potential for serious harm to an unborn child. Talk to your health care provider for more information. Do not breast-feed an infant while taking this medicine or for 2 months after stopping it. This medicine may make it more difficult to get pregnant or father a child. Talk to your health care provider if you are concerned about your fertility. What side effects may I notice from receiving  this medication? Side effects that you should report to your doctor or health care professional as soon as possible: allergic reactions (skin rash; itching or hives; swelling of the face, lips, or tongue) bleeding (bloody or black, tarry stools; red or dark brown urine; spitting up blood or brown material that looks like coffee grounds; red spots on the skin; unusual bruising or bleeding from the eye, gums, or nose) blurred vision or changes in vision confusion constipation headache heart failure (trouble breathing; fast, irregular heartbeat; sudden weight gain; swelling of the ankles, feet, hands) infection (fever, chills, cough, sore throat, pain or trouble passing urine) lack or loss of appetite liver injury (dark yellow or brown urine; general ill feeling or flu-like symptoms; loss of appetite, right upper belly pain; yellowing of the eyes or skin) low blood pressure (dizziness; feeling faint or lightheaded, falls; unusually weak or tired) muscle cramps pain, redness, or irritation at site where injected pain, tingling, numbness in the hands or feet seizures trouble breathing unusual bruising or bleeding Side effects that usually do not require medical attention (report to your doctor or health care professional if they continue or are bothersome): diarrhea nausea stomach pain trouble sleeping vomiting This list may not describe all possible side effects. Call your doctor for medical advice about side effects. You may report side effects to FDA at 1-800-FDA-1088. Where should I keep my medication? This medicine is given in a hospital or clinic. It will not be stored at home. NOTE: This sheet is a summary. It may not cover all possible information. If you have questions about this medicine, talk to your doctor, pharmacist, or health care provider.  2023 Elsevier/Gold Standard (2020-10-23 00:00:00)

## 2022-05-20 NOTE — Progress Notes (Signed)
Received update from pathology that [facility] [employer] cannot be run on a bone biopsy. Patient will either need bone marrow biopsy or fresh tissue biopsy. Dr Marin Olp notified.   Patient here for his first treatment. Received chemo education in infusion. Patient's wife, Charlett Blake, states they didn't get the prescription for decadron and asks for it to be sent downstairs. Prescription sent.    His wife brings paperwork for a [employer] and this was sent to our financial specialist. Patient and his wife recommended to give this information to any pharmacy in which they fill prescriptions.   Oncology Nurse Navigator Documentation     05/20/2022    1:15 PM  Oncology Nurse Navigator Flowsheets  Phase of Treatment Chemo  Chemotherapy Actual Start Date: 05/20/2022  Navigator Follow Up Date: 06/02/2022  Navigator Follow Up Reason: Follow-up Appointment;Chemotherapy  Navigator Location [facility]  Navigator Encounter Type Treatment;Appt/Treatment Plan Review  Treatment Initiated Date 05/20/2022  Patient Visit Type MedOnc  Treatment Phase First Chemo Tx  Barriers/Navigation Needs Coordination of Care;Education  Education Other  Interventions Coordination of Care;Education;Psycho-Social Support  Acuity Level 2-Minimal Needs (1-2 Barriers Identified)  Coordination of Care Pathology;Other  Education Method Verbal  Support Groups/Services Friends and Family  Time Spent with Patient 71

## 2022-05-20 NOTE — Progress Notes (Signed)
Patient in chemotherapy education class with self.  Discussed side effects of Velcade, Revlimid, Dexamethasone and Gazyva which include but are not limited to myelosuppression, decreased appetite, fatigue, fever, allergic or infusional reaction, mucositis, cardiac toxicity, cough, SOB, altered taste, nausea and vomiting, diarrhea, constipation, elevated LFTs myalgia and arthralgias, hair loss or thinning, rash, skin dryness, nail changes, peripheral neuropathy, discolored urine, delayed wound healing, mental changes (Chemo brain), increased risk of infections, weight loss.  Reviewed infusion room and office policy and procedure and phone numbers 24 hours x 7 days a week.  Reviewed ambulatory pump specifics and how to manage safe handling at home.  Reviewed when to call the office with any concerns or problems.  Scientist, clinical (histocompatibility and immunogenetics) given.  Discussed portacath insertion and EMLA cream administration.  Antiemetic protocol and chemotherapy schedule reviewed. Patient verbalized understanding of chemotherapy indications and possible side effects.  Teachback done

## 2022-05-20 NOTE — Progress Notes (Signed)
MD aware pt at ED d/t clogged foley catheter . Pt on Amoxicillin '500mg'$   Per MD " ok to treat pt despite pt taking antibiotics."

## 2022-05-21 ENCOUNTER — Other Ambulatory Visit: Payer: Self-pay | Admitting: Hematology & Oncology

## 2022-05-21 ENCOUNTER — Encounter: Payer: Self-pay | Admitting: *Deleted

## 2022-05-21 DIAGNOSIS — C9000 Multiple myeloma not having achieved remission: Secondary | ICD-10-CM

## 2022-05-21 NOTE — Progress Notes (Signed)
Called patient, and spoke to his wife, Barry Gray. Barry Gray did well yesterday. He had a moment of flushing and low grade temp last evening, but it resolved and he is better this morning. Barry Gray also mentions that he had a restless night, but when reviewing his medication, she mentioned that he took his decadron at bedtime. Reviewed the side effects for the decadron and recommended that he take future doses in the morning. She agreed with that plan.   Reviewed all medications from his treatment plan and answered any questions that she had. Barry Gray confirms his next appointment date and time.   Encouraged Etta to call the office with any questions or concerns they have.   Oncology Nurse Navigator Documentation     05/21/2022    9:45 AM  Oncology Nurse Navigator Flowsheets  Navigator Follow Up Date: 06/02/2022  Navigator Follow Up Reason: Follow-up Appointment;Chemotherapy  Navigator Restaurant manager, fast food Encounter Type Telephone  Telephone Patient Update;Outgoing Call  Patient Visit Type MedOnc  Treatment Phase Active Tx  Barriers/Navigation Needs Coordination of Care;Education  Education Pain/ Symptom Management  Interventions Education;Psycho-Social Support  Acuity Level 2-Minimal Needs (1-2 Barriers Identified)  Education Method Verbal;Teach-back  Support Groups/Services Friends and Family  Time Spent with Patient 30

## 2022-05-23 NOTE — Progress Notes (Signed)
Radiation Oncology         [phone] ________________________________  Initial Outpatient Consultation  Name: CHARISTOPHER RUMBLE MRN: [id number]  Date: 05/24/2022  DOB: 1956-07-04  SU:PJSRPRX, Seth Bake, NP  Volanda Napoleon, MD   REFERRING PHYSICIAN: Volanda Napoleon, MD  DIAGNOSIS: There were no encounter diagnoses.  IgA kappa myeloma   Cancer Staging  Multiple myeloma ([place]) Staging form: Plasma Cell Myeloma and Plasma Cell Disorders, AJCC 8th Edition - Clinical stage from 05/07/2022: Beta-2-microglobulin (mg/L): 2.1, Albumin (g/dL): 4, ISS: Stage I, High-risk cytogenetics: Unknown, LDH: Normal - Signed by Volanda Napoleon, MD on 05/10/2022  HISTORY OF PRESENT ILLNESS::Lloyde D Lashley is a 66 y.o. male who is accompanied by ***. he is seen as a courtesy of Dr. Marin Olp for an opinion concerning radiation therapy as part of management for his recently diagnosed multiple myeloma.   The patient presented to Dr. Lynann Bologna, Orthopedic Surgery, this past May for evaluation of neck pain which began this past November 2022.   MRI performed on 04/02/22 showed a burst fracture at C7 with near complete loss of height.   CT of the cervical spine on 04/16/22 further revealed a C7 lytic tumor in the bilateral posterior elements, with some bulky ventral epidural tumor appreciated within the spinal canal. CT of the chest abdomen and pelvis performed on that same date showed numerous lucent lesions involving the axial and proximal appendicular skeleton with vertebra plana of the C7 vertebral body, and an associated soft tissue lesion at the same level extending into the right neural foramina and vertebral canal. Findings were notably suspicious for multiple myeloma. CT also showed a pathologic fracture of the left second rib with an associated lucent lesion. CT otherwise showed no evidence of adenopathy in the chest, abdomen or pelvis.  Lab work performed on 04/22/22 showed WBC count of 11.1, hemoglobin  of 15.3, and calcium of 10.1. His blood sugar was also high at 241.    Repeat MRI of the cervical spine performed on 04/23/22 redemonstrated the C7 vertebra plana tumor (without change since prior MRI) extending into the bilateral posterior elements, and with significant circumferential tumor extension causing  severe spinal canal stenosis and compression of the spinal cord at both C6-C7 and C7-T1. No evidence of abnormal signal or enhancement in the spinal cord was appreciated. CT also revealed severe bilateral neural foraminal narrowing, and additional abnormal signaling (to a lesser extent) in C4, C5, C6, T1, T2, and T3. Overall, these findings were most concerning for multiple myeloma versus metastatic disease.   Accordingly, the patient was referred to Dr. Marin Olp on 04/26/22 for further evaluation and management. Given concerns of cord involvement, the patient was given decadron. The patient was also given Zometa. Moving forward, the patient was advised to proceed with surgery to manage his profound cervical myelopathy.   Myeloma testing performed on 04/26/22 showed: monoclonal spike 0.6 g/dL, IgA level 590 mg/dL, Kappa free light chain 512.4 mg/dL, and a kappa lambda light chain ratio of 49.75. Immunofixation also showed IgA monoclonal protein with kappa light chain specificity.  Metastatic bone survey performed on 04/26/22 showed findings consistent with myeloma with multifocal bony involvement  On 04/28/22, the patient proceeded to undergo anterior cervical decompression and fusion, with ACDF at C5-6, corpectomy at C7, and fusion spanning C5-T2 posteriorly under the care of Dr. Lynann Bologna. Pathology from biopsy of the C7 cervical tumor revealed findings consistent with kappa restricted plasma cell neoplasm.   Accordingly, the patient followed up with Dr. Marin Olp  on 05/07/22 to discuss treatment options. During this visit, the patient reported feeling better despite a bit of decreased strength in  his legs. In terms of treatment options, the patient agreed to proceed with chemotherapy consisting of Faspro/Velcade/Revlimid/Decadron, followed by stem cell transplant. The patient received his first cycle on 05/20/22.   Repeat myeloma testing performed on 05/07/22 showed: kappa free light chain 488.3 mg/L, kappa lamda light chain ratio of 36.99, and IgA level of 534 mg/dL.   PREVIOUS RADIATION THERAPY: No  PAST MEDICAL HISTORY:  Past Medical History:  Diagnosis Date   Diabetes mellitus    Fracture of fibula, distal 10/2011   w/ mild posterior displacement of distal fracture fragments   Kidney stone    Multiple myeloma ([place]) 05/07/2022    PAST SURGICAL HISTORY: Past Surgical History:  Procedure Laterality Date   ANTERIOR CERVICAL DECOMPRESSION/DISCECTOMY FUSION 4 LEVELS N/A 04/28/2022   Procedure: ANTERIOR CERVICAL DECOMPRESSION FUSION CERVICAL 5- CERVICAL 6, CERVICAL 6- CERVICAL 7, CERVICAL 7 - THORACIC 1 , Thoracic 1 - thoracic 2 WITH CERVICAL 7 CORPECTOMY WITH INSTRUMENTATION AND ALLOGRAFT;  Surgeon: Phylliss Bob, MD;  Location: [facility];  Service: Orthopedics;  Laterality: N/A;   COLONOSCOPY     POSTERIOR CERVICAL FUSION/FORAMINOTOMY  04/28/2022   Procedure: POSTERIOR CERVICAL FUSION/FORAMINOTOMY LEVEL 3;  Surgeon: Phylliss Bob, MD;  Location: [facility];  Service: Orthopedics;;    FAMILY HISTORY: No family history on file.  SOCIAL HISTORY:  Social History   Tobacco Use   Smoking status: Former    Packs/day: 1.00    Years: 15.00    Total pack years: 15.00    Types: Cigarettes    Quit date: 1990    Years since quitting: 33.5   Smokeless tobacco: Never  Vaping Use   Vaping Use: Never used  Substance Use Topics   Alcohol use: No   Drug use: No    ALLERGIES: No Known Allergies  MEDICATIONS:  Current Outpatient Medications  Medication Sig Dispense Refill   aspirin 325 MG tablet Take 325 mg by mouth daily.     cephALEXin (KEFLEX) 500 MG capsule Take 1 capsule (500 mg  total) by mouth 3 (three) times daily. 21 capsule 0   Cholecalciferol (VITAMIN D3 PO) Take 1 tablet by mouth daily.     Cyanocobalamin (B-12 PO) Take 1 tablet by mouth daily.     dexamethasone (DECADRON) 4 MG tablet Take 5 tablets (20 mg total) by mouth once a week. Start on 05/20/2022 100 tablet 3   famciclovir (FAMVIR) 250 MG tablet Take 1 tablet (250 mg total) by mouth daily. 30 tablet 12   Lactulose 20 GM/30ML SOLN Take 20 mLs (13.3333 g total) by mouth every 8 (eight) hours as needed. 450 mL 1   lactulose, encephalopathy, (CHRONULAC) 10 GM/15ML SOLN Take 20 mls by mouth every 8 hours as needed 450 mL 1   lenalidomide (REVLIMID) 25 MG capsule Take one capsule by mouth for 21 days ON, then OFF for seven days. [employer] Auth # [id number]  Date Obtained 05/07/22 21 capsule 0   lisinopril (ZESTRIL) 10 MG tablet Take 10 mg by mouth daily as needed (blood pressure of 160/85). (Patient not taking: Reported on 04/26/2022)     LORazepam (ATIVAN) 0.5 MG tablet Take 1 tablet (0.5 mg total) by mouth every 6 (six) hours as needed (Nausea or vomiting). 30 tablet 0   metFORMIN (GLUCOPHAGE) 500 MG tablet Take 500 mg by mouth 2 (two) times daily with a meal.  methocarbamol (ROBAXIN) 500 MG tablet Take 1 tablet (500 mg total) by mouth every 6 (six) hours as needed for muscle spasms. 30 tablet 2   montelukast (SINGULAIR) 10 MG tablet Take 1 tablet (10 mg total) by mouth at bedtime. Start Singulair 2 days before you start the chemotherapy in July. 7 tablet 0   ondansetron (ZOFRAN) 8 MG tablet Take 1 tablet (8 mg total) by mouth 2 (two) times daily as needed (Nausea or vomiting). 30 tablet 1   oxyCODONE-acetaminophen (PERCOCET) 10-325 MG tablet Take 1 tablet by mouth every 4 (four) hours as needed for pain.     oxyCODONE-acetaminophen (PERCOCET/ROXICET) 5-325 MG tablet Take 1-2 tablets by mouth every 4 (four) hours as needed for severe pain. 30 tablet 0   polyethylene glycol (MIRALAX / GLYCOLAX) 17 g packet Take 17 g  by mouth daily as needed for moderate constipation.     prochlorperazine (COMPAZINE) 10 MG tablet Take 1 tablet (10 mg total) by mouth every 6 (six) hours as needed (Nausea or vomiting). 30 tablet 1   tamsulosin (FLOMAX) 0.4 MG CAPS capsule Take 0.4 mg by mouth daily.     No current facility-administered medications for this encounter.    REVIEW OF SYSTEMS:  A 10+ POINT REVIEW OF SYSTEMS WAS OBTAINED including neurology, dermatology, psychiatry, cardiac, respiratory, lymph, extremities, GI, GU, musculoskeletal, constitutional, reproductive, HEENT. ***   PHYSICAL EXAM:  vitals were not taken for this visit.   General: Alert and oriented, in no acute distress HEENT: Head is normocephalic. Extraocular movements are intact. Oropharynx is clear. Neck: Neck is supple, no palpable cervical or supraclavicular lymphadenopathy. Heart: Regular in rate and rhythm with no murmurs, rubs, or gallops. Chest: Clear to auscultation bilaterally, with no rhonchi, wheezes, or rales. Abdomen: Soft, nontender, nondistended, with no rigidity or guarding. Extremities: No cyanosis or edema. Lymphatics: see Neck Exam Skin: No concerning lesions. Musculoskeletal: symmetric strength and muscle tone throughout. Neurologic: Cranial nerves II through XII are grossly intact. No obvious focalities. Speech is fluent. Coordination is intact. Psychiatric: Judgment and insight are intact. Affect is appropriate. ***  ECOG = ***  0 - Asymptomatic (Fully active, able to carry on all predisease activities without restriction)  1 - Symptomatic but completely ambulatory (Restricted in physically strenuous activity but ambulatory and able to carry out work of a light or sedentary nature. For example, light housework, office work)  2 - Symptomatic, <50% in bed during the day (Ambulatory and capable of all self care but unable to carry out any work activities. Up and about more than 50% of waking hours)  3 - Symptomatic, >50% in  bed, but not bedbound (Capable of only limited self-care, confined to bed or chair 50% or more of waking hours)  4 - Bedbound (Completely disabled. Cannot carry on any self-care. Totally confined to bed or chair)  5 - Death   Eustace Pen MM, Creech RH, Tormey DC, et al. [phone]). "Toxicity and response criteria of the [facility]". [place] Oncol. 5 (6): 649-55  LABORATORY DATA:  Lab Results  Component Value Date   WBC 8.0 05/20/2022   HGB 10.7 (L) 05/20/2022   HCT 32.8 (L) 05/20/2022   MCV 92.9 05/20/2022   PLT 366 05/20/2022   NEUTROABS 5.5 05/20/2022   Lab Results  Component Value Date   NA 139 05/20/2022   K 4.1 05/20/2022   CL 103 05/20/2022   CO2 24 05/20/2022   GLUCOSE 174 (H) 05/20/2022   BUN 10 05/20/2022  CREATININE 1.16 05/20/2022   CALCIUM 9.6 05/20/2022      RADIOGRAPHY: DG Cervical Spine 2 or 3 views  Result Date: 04/28/2022 CLINICAL DATA:  C5-T2 posterior cervical fusion with ACDF and corpectomy. EXAM: CERVICAL SPINE - 2-3 VIEW COMPARISON:  MR cervical spine 04/23/2022 and CT cervical spine 04/16/2022. FINDINGS: Three intraoperative fluoroscopic spot views of the lower cervical and upper thoracic spine shows C5-T2 posterior cervical fusion with C7 corpectomy and strut placement. Anterior cervical fusion from C5-T1 with a C5-6 interbody spacer. Osseous detail is degraded by technique. IMPRESSION: Intraoperative visualization for C5-T2 posterior cervical interbody fusion, C7 corpectomy and C5-7 anterior cervical fusion. Electronically Signed   By: Lorin Picket M.D.   On: 04/28/2022 15:56   DG C-Arm 1-60 Min-No Report  Result Date: 04/28/2022 Fluoroscopy was utilized by the requesting physician.  No radiographic interpretation.   DG C-Arm 1-60 Min-No Report  Result Date: 04/28/2022 Fluoroscopy was utilized by the requesting physician.  No radiographic interpretation.   DG C-Arm 1-60 Min-No Report  Result Date: 04/28/2022 Fluoroscopy was  utilized by the requesting physician.  No radiographic interpretation.   DG C-Arm 1-60 Min-No Report  Result Date: 04/28/2022 Fluoroscopy was utilized by the requesting physician.  No radiographic interpretation.   DG C-Arm 1-60 Min-No Report  Result Date: 04/28/2022 Fluoroscopy was utilized by the requesting physician.  No radiographic interpretation.   DG C-Arm 1-60 Min-No Report  Result Date: 04/28/2022 Fluoroscopy was utilized by the requesting physician.  No radiographic interpretation.   DG C-Arm 1-60 Min-No Report  Result Date: 04/28/2022 Fluoroscopy was utilized by the requesting physician.  No radiographic interpretation.   DG Cervical Spine 1 View  Result Date: 04/28/2022 CLINICAL DATA:  Localization film for C5 through T2 cervical fusion. EXAM: DG CERVICAL SPINE - 1 VIEW COMPARISON:  CT cervical spine 04/16/2022 FINDINGS: There is anterior plate and screw fixation hardware at the C5 through T1 levels. The more inferior levels are difficult to visualized due to overlying shoulder bones and soft tissues. A surgical probe from posterior approach anterior superior tip overlies the posterior elements at the height of the C5-6 disc prosthesis Mild anterior C4-5 endplate osteophytes. IMPRESSION: Surgical probe tip terminates at the level of the C5-6 disc. Electronically Signed   By: Yvonne Kendall M.D.   On: 04/28/2022 14:21   DG Bone Survey Met  Result Date: 04/27/2022 CLINICAL DATA:  New diagnosis of myeloma. EXAM: METASTATIC BONE SURVEY COMPARISON:  MRI cervical spine 04/23/2022. CT cervical spine 04/16/2022. CT chest and abdomen 04/16/2022. FINDINGS: Standard imaging of the axial and appendicular skeleton obtained. Chest x-ray reveals bilateral nipple shadows. No acute cardiopulmonary disease identified. No definite focal skull lesions are identified. Known rib lesions best identified prior CT. Lower cervical spine is poorly visualized by plain film exam. Reference is made to prior  recent MRI and CT reports, particularly for discussion of pathologic C7 process and multifocal spine lesions. A small lucency in the right clavicle cannot be excluded. Small lucency in the proximal left humerus cannot be excluded. Multiple small lucencies noted in the right humerus. Right acetabular lucencies noted. Proximal right femoral lucency noted. Questionable small lucency in the proximal left femur. Diffuse osteopenia degenerative change. IMPRESSION: 1. Findings consistent with myeloma with multifocal bony involvement as above. 2. C7 is poorly visualized on plain film exam. Reference is made to cervical MRI and CT reports for discussion of pathologic process involving C7 and other multifocal spine lesions. Electronically Signed   By: Marcello Moores  Register M.D.  On: 04/27/2022 10:44      IMPRESSION: IgA kappa myeloma  ***  Today, I talked to the patient and family about the findings and work-up thus far.  We discussed the natural history of *** and general treatment, highlighting the role of radiotherapy in the management.  We discussed the available radiation techniques, and focused on the details of logistics and delivery.  We reviewed the anticipated acute and late sequelae associated with radiation in this setting.  The patient was encouraged to ask questions that I answered to the best of my ability. *** A patient consent form was discussed and signed.  We retained a copy for our records.  The patient would like to proceed with radiation and will be scheduled for CT simulation.  PLAN: ***    *** minutes of total time was spent for this patient encounter, including preparation, face-to-face counseling with the patient and coordination of care, physical exam, and documentation of the encounter.   ------------------------------------------------  Blair Promise, PhD, MD  This document serves as a record of services personally performed by Gery Pray, MD. It was created on his behalf by Roney Mans, a trained medical scribe. The creation of this record is based on the scribe's personal observations and the provider's statements to them. This document has been checked and approved by the attending provider.

## 2022-05-24 ENCOUNTER — Ambulatory Visit
Admission: RE | Admit: 2022-05-24 | Discharge: 2022-05-24 | Disposition: A | Discharge status: Home / Self Care | Payer: Medicare Other | Source: Ambulatory Visit | Attending: Radiation Oncology | Admitting: Radiation Oncology

## 2022-05-24 ENCOUNTER — Encounter: Payer: Self-pay | Admitting: Radiation Oncology

## 2022-05-24 ENCOUNTER — Other Ambulatory Visit: Payer: Self-pay

## 2022-05-24 ENCOUNTER — Telehealth: Payer: Self-pay | Admitting: Oncology

## 2022-05-24 DIAGNOSIS — Z79624 Long term (current) use of inhibitors of nucleotide synthesis: Secondary | ICD-10-CM | POA: Insufficient documentation

## 2022-05-24 DIAGNOSIS — M4802 Spinal stenosis, cervical region: Secondary | ICD-10-CM | POA: Diagnosis not present

## 2022-05-24 DIAGNOSIS — E119 Type 2 diabetes mellitus without complications: Secondary | ICD-10-CM | POA: Insufficient documentation

## 2022-05-24 DIAGNOSIS — Z7952 Long term (current) use of systemic steroids: Secondary | ICD-10-CM | POA: Diagnosis not present

## 2022-05-24 DIAGNOSIS — R29890 Loss of height: Secondary | ICD-10-CM | POA: Insufficient documentation

## 2022-05-24 DIAGNOSIS — Z7982 Long term (current) use of aspirin: Secondary | ICD-10-CM | POA: Diagnosis not present

## 2022-05-24 DIAGNOSIS — Z87891 Personal history of nicotine dependence: Secondary | ICD-10-CM | POA: Diagnosis not present

## 2022-05-24 DIAGNOSIS — Z7984 Long term (current) use of oral hypoglycemic drugs: Secondary | ICD-10-CM | POA: Diagnosis not present

## 2022-05-24 DIAGNOSIS — M8580 Other specified disorders of bone density and structure, unspecified site: Secondary | ICD-10-CM | POA: Insufficient documentation

## 2022-05-24 DIAGNOSIS — C9000 Multiple myeloma not having achieved remission: Secondary | ICD-10-CM

## 2022-05-24 LAB — UPEP/UIFE/LIGHT CHAINS/TP, 24-HR UR
% BETA, Urine: 59.2 %
ALPHA 1 URINE: 1.8 %
Albumin, U: 31.2 %
Alpha 2, Urine: 5.1 %
Free Kappa Lt Chains,Ur: 5346.94 mg/L — ABNORMAL HIGH (ref 1.17–86.46)
Free Kappa/Lambda Ratio: 373.91 — ABNORMAL HIGH (ref 1.83–14.26)
Free Lambda Lt Chains,Ur: 14.3 mg/L (ref 0.27–15.21)
GAMMA GLOBULIN URINE: 2.7 %
M-SPIKE %, Urine: 52.8 % — ABNORMAL HIGH
M-Spike, Mg/24 Hr: 825 mg/24 hr — ABNORMAL HIGH
Total Protein, Urine-Ur/day: 1563 mg/24 hr — ABNORMAL HIGH (ref 30–150)
Total Protein, Urine: 135.9 mg/dL
Total Volume: 1150

## 2022-05-24 NOTE — Telephone Encounter (Signed)
Left a message for Dr. Laurena Bering office to see when patient will be cleared to start radiation.  Requested a return call.

## 2022-05-26 ENCOUNTER — Encounter: Payer: Self-pay | Admitting: Hematology & Oncology

## 2022-05-26 ENCOUNTER — Other Ambulatory Visit: Payer: Self-pay

## 2022-05-26 DIAGNOSIS — C9000 Multiple myeloma not having achieved remission: Secondary | ICD-10-CM

## 2022-05-26 NOTE — Telephone Encounter (Signed)
Called Dr. Laurena Bering office at [facility] and spoke to [place].  She said Dr. Lynann Bologna replied that Mr. Riddles can start radiation at any time.

## 2022-05-27 ENCOUNTER — Inpatient Hospital Stay: Payer: Medicare Other

## 2022-05-27 ENCOUNTER — Other Ambulatory Visit (HOSPITAL_BASED_OUTPATIENT_CLINIC_OR_DEPARTMENT_OTHER): Payer: Self-pay

## 2022-05-27 ENCOUNTER — Encounter: Payer: Self-pay | Admitting: Hematology & Oncology

## 2022-05-27 VITALS — BP 112/73 | HR 92 | Resp 20

## 2022-05-27 DIAGNOSIS — C9000 Multiple myeloma not having achieved remission: Secondary | ICD-10-CM

## 2022-05-27 DIAGNOSIS — Z5112 Encounter for antineoplastic immunotherapy: Secondary | ICD-10-CM | POA: Diagnosis not present

## 2022-05-27 LAB — CBC WITH DIFFERENTIAL (CANCER CENTER ONLY)
Abs Immature Granulocytes: 0.04 10*3/uL (ref 0.00–0.07)
Basophils Absolute: 0 10*3/uL (ref 0.0–0.1)
Basophils Relative: 0 %
Eosinophils Absolute: 0.3 10*3/uL (ref 0.0–0.5)
Eosinophils Relative: 3 %
HCT: 36.1 % — ABNORMAL LOW (ref 39.0–52.0)
Hemoglobin: 11.9 g/dL — ABNORMAL LOW (ref 13.0–17.0)
Immature Granulocytes: 0 %
Lymphocytes Relative: 9 %
Lymphs Abs: 0.9 10*3/uL (ref 0.7–4.0)
MCH: 29.8 pg (ref 26.0–34.0)
MCHC: 33 g/dL (ref 30.0–36.0)
MCV: 90.5 fL (ref 80.0–100.0)
Monocytes Absolute: 0.5 10*3/uL (ref 0.1–1.0)
Monocytes Relative: 6 %
Neutro Abs: 7.7 10*3/uL (ref 1.7–7.7)
Neutrophils Relative %: 82 %
Platelet Count: 265 10*3/uL (ref 150–400)
RBC: 3.99 MIL/uL — ABNORMAL LOW (ref 4.22–5.81)
RDW: 12.9 % (ref 11.5–15.5)
WBC Count: 9.5 10*3/uL (ref 4.0–10.5)
nRBC: 0 % (ref 0.0–0.2)

## 2022-05-27 LAB — CMP (CANCER CENTER ONLY)
ALT: 10 U/L (ref 0–44)
AST: 10 U/L — ABNORMAL LOW (ref 15–41)
Albumin: 4.2 g/dL (ref 3.5–5.0)
Alkaline Phosphatase: 60 U/L (ref 38–126)
Anion gap: 13 (ref 5–15)
BUN: 13 mg/dL (ref 8–23)
CO2: 20 mmol/L — ABNORMAL LOW (ref 22–32)
Calcium: 9.3 mg/dL (ref 8.9–10.3)
Chloride: 100 mmol/L (ref 98–111)
Creatinine: 1.28 mg/dL — ABNORMAL HIGH (ref 0.61–1.24)
GFR, Estimated: >60 mL/min (ref >60)
Glucose, Bld: 257 mg/dL — ABNORMAL HIGH (ref 70–99)
Potassium: 4.2 mmol/L (ref 3.5–5.1)
Sodium: 133 mmol/L — ABNORMAL LOW (ref 135–145)
Total Bilirubin: 0.8 mg/dL (ref 0.3–1.2)
Total Protein: 6.6 g/dL (ref 6.5–8.1)

## 2022-05-27 MED ORDER — DARATUMUMAB-HYALURONIDASE-FIHJ 1800-30000 MG-UT/15ML ~~LOC~~ SOLN
1800.0000 mg | Freq: Once | SUBCUTANEOUS | Status: AC
  Administered 2022-05-27: 1800 mg via SUBCUTANEOUS
  Filled 2022-05-27: qty 15

## 2022-05-27 MED ORDER — MONTELUKAST SODIUM 10 MG PO TABS
10.0000 mg | ORAL_TABLET | Freq: Once | ORAL | Status: AC
  Administered 2022-05-27: 10 mg via ORAL
  Filled 2022-05-27: qty 1

## 2022-05-27 MED ORDER — DIPHENHYDRAMINE HCL 25 MG PO CAPS
50.0000 mg | ORAL_CAPSULE | Freq: Once | ORAL | Status: AC
  Administered 2022-05-27: 50 mg via ORAL
  Filled 2022-05-27: qty 2

## 2022-05-27 MED ORDER — BORTEZOMIB CHEMO SQ INJECTION 3.5 MG (2.5MG/ML)
1.3000 mg/m2 | Freq: Once | INTRAMUSCULAR | Status: AC
  Administered 2022-05-27: 2.75 mg via SUBCUTANEOUS
  Filled 2022-05-27: qty 1.1

## 2022-05-27 MED ORDER — ACETAMINOPHEN 325 MG PO TABS
650.0000 mg | ORAL_TABLET | Freq: Once | ORAL | Status: AC
  Administered 2022-05-27: 650 mg via ORAL
  Filled 2022-05-27: qty 2

## 2022-05-27 MED ORDER — DEXAMETHASONE 4 MG PO TABS
20.0000 mg | ORAL_TABLET | Freq: Once | ORAL | Status: AC
  Administered 2022-05-27: 20 mg via ORAL
  Filled 2022-05-27: qty 5

## 2022-05-27 NOTE — Patient Instructions (Signed)
Bortezomib injection What is this medication? BORTEZOMIB (bor TEZ oh mib) targets proteins in cancer cells and stops the cancer cells from growing. It treats multiple myeloma and mantle cell lymphoma. This medicine may be used for other purposes; ask your health care provider or pharmacist if you have questions. COMMON BRAND NAME(S): Velcade What should I tell my care team before I take this medication? They need to know if you have any of these conditions: dehydration diabetes (high blood sugar) heart disease liver disease tingling of the fingers or toes or other nerve disorder an unusual or allergic reaction to bortezomib, mannitol, boron, other medicines, foods, dyes, or preservatives pregnant or trying to get pregnant breast-feeding How should I use this medication? This medicine is injected into a vein or under the skin. It is given by a health care provider in a hospital or clinic setting. Talk to your health care provider about the use of this medicine in children. Special care may be needed. Overdosage: If you think you have taken too much of this medicine contact a poison control center or emergency room at once. NOTE: This medicine is only for you. Do not share this medicine with others. What if I miss a dose? Keep appointments for follow-up doses. It is important not to miss your dose. Call your health care provider if you are unable to keep an appointment. What may interact with this medication? This medicine may interact with the following medications: ketoconazole rifampin This list may not describe all possible interactions. Give your health care provider a list of all the medicines, herbs, non-prescription drugs, or dietary supplements you use. Also tell them if you smoke, drink alcohol, or use illegal drugs. Some items may interact with your medicine. What should I watch for while using this medication? Your condition will be monitored carefully while you are receiving  this medicine. You may need blood work done while you are taking this medicine. You may get drowsy or dizzy. Do not drive, use machinery, or do anything that needs mental alertness until you know how this medicine affects you. Do not stand up or sit up quickly, especially if you are an older patient. This reduces the risk of dizzy or fainting spells This medicine may increase your risk of getting an infection. Call your health care provider for advice if you get a fever, chills, sore throat, or other symptoms of a cold or flu. Do not treat yourself. Try to avoid being around people who are sick. Check with your health care provider if you have severe diarrhea, nausea, and vomiting, or if you sweat a lot. The loss of too much body fluid may make it dangerous for you to take this medicine. Do not become pregnant while taking this medicine or for 7 months after stopping it. Women should inform their health care provider if they wish to become pregnant or think they might be pregnant. Men should not father a child while taking this medicine and for 4 months after stopping it. There is a potential for serious harm to an unborn child. Talk to your health care provider for more information. Do not breast-feed an infant while taking this medicine or for 2 months after stopping it. This medicine may make it more difficult to get pregnant or father a child. Talk to your health care provider if you are concerned about your fertility. What side effects may I notice from receiving this medication? Side effects that you should report to your doctor or health  care professional as soon as possible: allergic reactions (skin rash; itching or hives; swelling of the face, lips, or tongue) bleeding (bloody or black, tarry stools; red or dark brown urine; spitting up blood or brown material that looks like coffee grounds; red spots on the skin; unusual bruising or bleeding from the eye, gums, or nose) blurred vision or changes  in vision confusion constipation headache heart failure (trouble breathing; fast, irregular heartbeat; sudden weight gain; swelling of the ankles, feet, hands) infection (fever, chills, cough, sore throat, pain or trouble passing urine) lack or loss of appetite liver injury (dark yellow or brown urine; general ill feeling or flu-like symptoms; loss of appetite, right upper belly pain; yellowing of the eyes or skin) low blood pressure (dizziness; feeling faint or lightheaded, falls; unusually weak or tired) muscle cramps pain, redness, or irritation at site where injected pain, tingling, numbness in the hands or feet seizures trouble breathing unusual bruising or bleeding Side effects that usually do not require medical attention (report to your doctor or health care professional if they continue or are bothersome): diarrhea nausea stomach pain trouble sleeping vomiting This list may not describe all possible side effects. Call your doctor for medical advice about side effects. You may report side effects to FDA at 1-800-FDA-1088. Where should I keep my medication? This medicine is given in a hospital or clinic. It will not be stored at home. NOTE: This sheet is a summary. It may not cover all possible information. If you have questions about this medicine, talk to your doctor, pharmacist, or health care provider.  2023 Elsevier/Gold Standard (2020-10-23 00:00:00)  Daratumumab; Hyaluronidase Injection What is this medication? DARATUMUMAB; HYALURONIDASE (dar a toom ue mab / hye al ur ON i dase) is a monoclonal antibody. Hyaluronidase is used to improve the effects of daratumumab. It treats certain types of cancer. Some of the cancers treated are multiple myeloma and light-chain amyloidosis. This medicine may be used for other purposes; ask your health care provider or pharmacist if you have questions. COMMON BRAND NAME(S): DARZALEX FASPRO What should I tell my care team before I take  this medication? They need to know if you have any of these conditions: heart disease infection especially a viral infection such as chickenpox, cold sores, herpes, or hepatitis B lung or breathing disease an unusual or allergic reaction to daratumumab, hyaluronidase, other medicines, foods, dyes, or preservatives pregnant or trying to get pregnant breast-feeding How should I use this medication? This medicine is for injection under the skin. It is given by a health care professional in a hospital or clinic setting. Talk to your pediatrician regarding the use of this medicine in children. Special care may be needed. Overdosage: If you think you have taken too much of this medicine contact a poison control center or emergency room at once. NOTE: This medicine is only for you. Do not share this medicine with others. What if I miss a dose? Keep appointments for follow-up doses as directed. It is important not to miss your dose. Call your doctor or health care professional if you are unable to keep an appointment. What may interact with this medication? Interactions have not been studied. This list may not describe all possible interactions. Give your health care provider a list of all the medicines, herbs, non-prescription drugs, or dietary supplements you use. Also tell them if you smoke, drink alcohol, or use illegal drugs. Some items may interact with your medicine. What should I watch for while using this  medication? Your condition will be monitored carefully while you are receiving this medicine. This medicine can cause serious allergic reactions. To reduce your risk, your health care provider may give you other medicine to take before receiving this one. Be sure to follow the directions from your health care provider. This medicine can affect the results of blood tests to match your blood type. These changes can last for up to 6 months after the final dose. Your healthcare provider will do  blood tests to match your blood type before you start treatment. Tell all of your healthcare providers that you are being treated with this medicine before receiving a blood transfusion. This medicine can affect the results of some tests used to determine treatment response; extra tests may be needed to evaluate response. Do not become pregnant while taking this medicine or for 3 months after stopping it. Women should inform their health care provider if they wish to become pregnant or think they might be pregnant. There is a potential for serious side effects to an unborn child. Talk to your health care provider for more information. Do not breast-feed an infant while taking this medicine. What side effects may I notice from receiving this medication? Side effects that you should report to your care team as soon as possible: Allergic reactions--skin rash, itching or hives, swelling of the face, lips, or tongue Blood clot--chest pain, shortness of breath, pain, swelling or warmth in the leg Blurred vision Fast, irregular heartbeat Infection--fever, chills, cough, sore throat, pain or trouble passing urine Injection reactions--dizziness, fast heartbeat, feeling faint or lightheaded, falls, headache, increase in blood pressure, nausea, vomiting, or wheezing or trouble breathing with loud or whistling sounds Low red blood cell counts--trouble breathing, feeling faint, lightheaded or falls, unusually weak or tired Unusual bleeding or bruising Side effects that usually do not require medical attention (report these to your care team if they continue or are bothersome): Back pain Constipation Diarrhea Pain, tingling, numbness in the hands or feet Pain, redness, or irritation at site where injected Muscle cramp or pain Swelling of the ankles, feet, hands Tiredness Trouble sleeping This list may not describe all possible side effects. Call your doctor for medical advice about side effects. You may  report side effects to FDA at 1-800-FDA-1088. Where should I keep my medication? This drug is given in a hospital or clinic and will not be stored at home. NOTE: This sheet is a summary. It may not cover all possible information. If you have questions about this medicine, talk to your doctor, pharmacist, or health care provider.  2023 Elsevier/Gold Standard (2021-10-02 00:00:00)

## 2022-05-28 ENCOUNTER — Other Ambulatory Visit (HOSPITAL_BASED_OUTPATIENT_CLINIC_OR_DEPARTMENT_OTHER): Payer: Self-pay

## 2022-05-31 ENCOUNTER — Telehealth: Payer: Self-pay | Admitting: *Deleted

## 2022-05-31 ENCOUNTER — Other Ambulatory Visit: Payer: Self-pay | Admitting: *Deleted

## 2022-05-31 DIAGNOSIS — C9000 Multiple myeloma not having achieved remission: Secondary | ICD-10-CM

## 2022-05-31 DIAGNOSIS — G959 Disease of spinal cord, unspecified: Secondary | ICD-10-CM

## 2022-05-31 MED ORDER — LENALIDOMIDE 25 MG PO CAPS: ORAL_CAPSULE | ORAL | 0 refills | Status: DC

## 2022-05-31 MED ORDER — FLUCONAZOLE 100 MG PO TABS: 100.0000 mg | ORAL_TABLET | Freq: Every day | ORAL | 0 refills | Status: DC

## 2022-05-31 NOTE — Telephone Encounter (Signed)
Barry Gray, patients wife called to let us know that patient has a linear rash on both sides of his waist.  Itchy, red and somewhat blistered but not painful per [place].  Dr Marin Olp asked for pictures to be sent.  Pictures observed by dr Marin Olp.  Feels since it is on both sides, would not be shingles.  Diflucan sent to patients pharmacy to start in case is fungal.  To see Dr Marin Olp on Wednesday in Follow up. Patient also constipated.  Taking lactulose as prescribed.  Ok to add MOM as well?  Ok per Dr Marin Olp.

## 2022-06-02 ENCOUNTER — Inpatient Hospital Stay (HOSPITAL_BASED_OUTPATIENT_CLINIC_OR_DEPARTMENT_OTHER): Payer: Medicare Other | Admitting: Hematology & Oncology

## 2022-06-02 ENCOUNTER — Encounter: Payer: Self-pay | Admitting: *Deleted

## 2022-06-02 ENCOUNTER — Inpatient Hospital Stay: Payer: Medicare Other

## 2022-06-02 ENCOUNTER — Encounter: Payer: Self-pay | Admitting: Hematology & Oncology

## 2022-06-02 VITALS — BP 114/60 | HR 71 | Resp 18

## 2022-06-02 VITALS — BP 133/93 | HR 81 | Temp 98.2°F | Resp 20 | Ht 70.0 in | Wt 181.8 lb

## 2022-06-02 DIAGNOSIS — C9000 Multiple myeloma not having achieved remission: Secondary | ICD-10-CM

## 2022-06-02 DIAGNOSIS — Z5112 Encounter for antineoplastic immunotherapy: Secondary | ICD-10-CM | POA: Diagnosis not present

## 2022-06-02 LAB — CMP (CANCER CENTER ONLY)
ALT: 14 U/L (ref 0–44)
AST: 9 U/L — ABNORMAL LOW (ref 15–41)
Albumin: 4.1 g/dL (ref 3.5–5.0)
Alkaline Phosphatase: 75 U/L (ref 38–126)
Anion gap: 9 (ref 5–15)
BUN: 13 mg/dL (ref 8–23)
CO2: 22 mmol/L (ref 22–32)
Calcium: 8.6 mg/dL — ABNORMAL LOW (ref 8.9–10.3)
Chloride: 102 mmol/L (ref 98–111)
Creatinine: 1.11 mg/dL (ref 0.61–1.24)
GFR, Estimated: >60 mL/min (ref >60)
Glucose, Bld: 200 mg/dL — ABNORMAL HIGH (ref 70–99)
Potassium: 4.7 mmol/L (ref 3.5–5.1)
Sodium: 133 mmol/L — ABNORMAL LOW (ref 135–145)
Total Bilirubin: 0.8 mg/dL (ref 0.3–1.2)
Total Protein: 6.2 g/dL — ABNORMAL LOW (ref 6.5–8.1)

## 2022-06-02 LAB — CBC WITH DIFFERENTIAL (CANCER CENTER ONLY)
Abs Immature Granulocytes: 0.05 10*3/uL (ref 0.00–0.07)
Basophils Absolute: 0 10*3/uL (ref 0.0–0.1)
Basophils Relative: 0 %
Eosinophils Absolute: 0.2 10*3/uL (ref 0.0–0.5)
Eosinophils Relative: 3 %
HCT: 33.5 % — ABNORMAL LOW (ref 39.0–52.0)
Hemoglobin: 11 g/dL — ABNORMAL LOW (ref 13.0–17.0)
Immature Granulocytes: 1 %
Lymphocytes Relative: 7 %
Lymphs Abs: 0.7 10*3/uL (ref 0.7–4.0)
MCH: 29.7 pg (ref 26.0–34.0)
MCHC: 32.8 g/dL (ref 30.0–36.0)
MCV: 90.5 fL (ref 80.0–100.0)
Monocytes Absolute: 1.3 10*3/uL — ABNORMAL HIGH (ref 0.1–1.0)
Monocytes Relative: 14 %
Neutro Abs: 6.9 10*3/uL (ref 1.7–7.7)
Neutrophils Relative %: 75 %
Platelet Count: 238 10*3/uL (ref 150–400)
RBC: 3.7 MIL/uL — ABNORMAL LOW (ref 4.22–5.81)
RDW: 13.3 % (ref 11.5–15.5)
WBC Count: 9.1 10*3/uL (ref 4.0–10.5)
nRBC: 0 % (ref 0.0–0.2)

## 2022-06-02 LAB — LACTATE DEHYDROGENASE: LDH: 141 U/L (ref 98–192)

## 2022-06-02 MED ORDER — ACETAMINOPHEN 325 MG PO TABS
650.0000 mg | ORAL_TABLET | Freq: Once | ORAL | Status: AC
  Administered 2022-06-02: 650 mg via ORAL
  Filled 2022-06-02: qty 2

## 2022-06-02 MED ORDER — MONTELUKAST SODIUM 10 MG PO TABS
10.0000 mg | ORAL_TABLET | Freq: Once | ORAL | Status: AC
  Administered 2022-06-02: 10 mg via ORAL
  Filled 2022-06-02: qty 1

## 2022-06-02 MED ORDER — DARATUMUMAB-HYALURONIDASE-FIHJ 1800-30000 MG-UT/15ML ~~LOC~~ SOLN
1800.0000 mg | Freq: Once | SUBCUTANEOUS | Status: AC
  Administered 2022-06-02: 1800 mg via SUBCUTANEOUS
  Filled 2022-06-02: qty 15

## 2022-06-02 MED ORDER — GLIPIZIDE 5 MG PO TABS: 2.5000 mg | ORAL_TABLET | Freq: Two times a day (BID) | ORAL | 4 refills | Status: DC

## 2022-06-02 MED ORDER — DIPHENHYDRAMINE HCL 25 MG PO CAPS
50.0000 mg | ORAL_CAPSULE | Freq: Once | ORAL | Status: AC
  Administered 2022-06-02: 50 mg via ORAL
  Filled 2022-06-02: qty 2

## 2022-06-02 MED ORDER — BORTEZOMIB CHEMO SQ INJECTION 3.5 MG (2.5MG/ML)
1.3000 mg/m2 | Freq: Once | INTRAMUSCULAR | Status: AC
  Administered 2022-06-02: 2.75 mg via SUBCUTANEOUS
  Filled 2022-06-02: qty 1.1

## 2022-06-02 NOTE — Patient Instructions (Signed)
[facility]  Discharge Instructions: Thank you for choosing [facility] to provide your oncology and hematology care.   If you have a lab appointment with the [facility], please go directly to the [facility] and check in at the registration area.  Wear comfortable clothing and clothing appropriate for easy access to any Portacath or PICC line.   We strive to give you quality time with your provider. You may need to reschedule your appointment if you arrive late (15 or more minutes).  Arriving late affects you and other patients whose appointments are after yours.  Also, if you miss three or more appointments without notifying the office, you may be dismissed from the clinic at the provider's discretion.      For prescription refill requests, have your pharmacy contact our office and allow 72 hours for refills to be completed.    Today you received the following chemotherapy and/or immunotherapy agents Darzalex, Velcade.      To help prevent nausea and vomiting after your treatment, we encourage you to take your nausea medication as directed.  BELOW ARE SYMPTOMS THAT SHOULD BE REPORTED IMMEDIATELY: *FEVER GREATER THAN 100.4 F (38 C) OR HIGHER *CHILLS OR SWEATING *NAUSEA AND VOMITING THAT IS NOT CONTROLLED WITH YOUR NAUSEA MEDICATION *UNUSUAL SHORTNESS OF BREATH *UNUSUAL BRUISING OR BLEEDING *URINARY PROBLEMS (pain or burning when urinating, or frequent urination) *BOWEL PROBLEMS (unusual diarrhea, constipation, pain near the anus) TENDERNESS IN MOUTH AND THROAT WITH OR WITHOUT PRESENCE OF ULCERS (sore throat, sores in mouth, or a toothache) UNUSUAL RASH, SWELLING OR PAIN  UNUSUAL VAGINAL DISCHARGE OR ITCHING   Items with * indicate a potential emergency and should be followed up as soon as possible or go to the Emergency Department if any problems should occur.  Please show the CHEMOTHERAPY ALERT CARD or IMMUNOTHERAPY ALERT CARD at check-in  to the Emergency Department and triage nurse. Should you have questions after your visit or need to cancel or reschedule your appointment, please contact [facility]  [phone] and follow the prompts.  Office hours are 8:00 a.m. to 4:30 p.m. Monday - Friday. Please note that voicemails left after 4:00 p.m. may not be returned until the following business day.  We are closed weekends and major holidays. You have access to a nurse at all times for urgent questions. Please call the main number to the clinic [phone] and follow the prompts.  For any non-urgent questions, you may also contact your provider using MyChart. We now offer e-Visits for anyone 70 and older to request care online for non-urgent symptoms. For details visit [email or web address].   Also download the MyChart app! Go to the app store, search "MyChart", open the app, select [facility], and log in with your MyChart username and password.  Masks are optional in the cancer centers. If you would like for your care team to wear a mask while they are taking care of you, please let them know. For doctor visits, patients may have with them one support person who is at least 66 years old. At this time, visitors are not allowed in the infusion area.

## 2022-06-02 NOTE — Progress Notes (Signed)
Hematology and Oncology Follow Up Visit  Barry Gray [id number] [birth year] 66 y.o. 06/02/2022   Principle Diagnosis:  IgA Kappa myeloma  Current Therapy:   S/p cervical spine stabilization -- 04/28/2022 Faspro/Velcade/Revlimid/Decadron -- start cycle #1 on 05/20/2022 Zometa 4 mg IV q 3 months -- next dose on 07/2022     Interim History:  Barry Gray is back for follow-up.  I think that he is having some problem with blood sugars.  His blood sugars are quite high.  I am sure this is from the Decadron.  He has a looks to be a candidal rash in the intertriginous areas.  We started him on Diflucan 2 days ago.  His blood sugar today was 200.  We will stop the Decadron.  He is on metformin at home.  Probably will need to add another oral hypoglycemic agent.  Otherwise, I think he is doing well with treatment.  He really has only had a couple doses of the Faspro and the Velcade.  He is doing okay on the Revlimid.  I do not think is any problems with Revlimid.  I do not think this rash is from Revlimid.  He sees [facility] on Friday.  Hopefully will have the cervical collar removed.  He is not complain of any pain right now.  He walked in.  He has a cane.  Have I think the Foley catheter is now out to him.  He is happy about this.  He has had no problems with leg swelling.  He has had no fever.  He has had no cough or shortness of breath.  Of note, we did do a 24-hour urine on him.  He has an incredible amount of Kappa light chain.  His level was 5350 mg/L.  I really am not surprised by this.  Overall, I would say his performance status is probably ECOG 1.    Medications:  Current Outpatient Medications:    aspirin 325 MG tablet, Take 325 mg by mouth daily., Disp: , Rfl:    famciclovir (FAMVIR) 250 MG tablet, Take 1 tablet (250 mg total) by mouth daily., Disp: 30 tablet, Rfl: 12   fluconazole (DIFLUCAN) 100 MG tablet, Take 1 tablet (100 mg total) by mouth  daily., Disp: 7 tablet, Rfl: 0   lactulose, encephalopathy, (CHRONULAC) 10 GM/15ML SOLN, Take 20 mls by mouth every 8 hours as needed, Disp: 450 mL, Rfl: 1   montelukast (SINGULAIR) 10 MG tablet, Take 1 tablet (10 mg total) by mouth at bedtime. Start Singulair 2 days before you start the chemotherapy in July., Disp: 7 tablet, Rfl: 0   polyethylene glycol (MIRALAX / GLYCOLAX) 17 g packet, Take 17 g by mouth daily as needed for moderate constipation., Disp: , Rfl:    tamsulosin (FLOMAX) 0.4 MG CAPS capsule, Take 0.4 mg by mouth daily., Disp: , Rfl:    Cyanocobalamin (B-12 PO), Take 1 tablet by mouth daily. (Patient not taking: Reported on 06/02/2022), Disp: , Rfl:    dexamethasone (DECADRON) 4 MG tablet, Take 5 tablets (20 mg total) by mouth once a week. Start on 05/20/2022 (Patient not taking: Reported on 06/02/2022), Disp: 100 tablet, Rfl: 3   lenalidomide (REVLIMID) 25 MG capsule, Take one capsule by mouth for 21 days ON, then OFF for seven days. Fanny Dance #[id number]  Date Obtained 05/31/22 (Patient not taking: Reported on 06/02/2022), Disp: 21 capsule, Rfl: 0   lisinopril (ZESTRIL) 10 MG tablet, Take 10 mg by mouth daily as  needed (blood pressure of 160/85). (Patient not taking: Reported on 06/02/2022), Disp: , Rfl:    LORazepam (ATIVAN) 0.5 MG tablet, Take 1 tablet (0.5 mg total) by mouth every 6 (six) hours as needed (Nausea or vomiting). (Patient not taking: Reported on 05/24/2022), Disp: 30 tablet, Rfl: 0   metFORMIN (GLUCOPHAGE) 500 MG tablet, Take 500 mg by mouth 2 (two) times daily with a meal.  (Patient not taking: Reported on 06/02/2022), Disp: , Rfl:    methocarbamol (ROBAXIN) 500 MG tablet, Take 1 tablet (500 mg total) by mouth every 6 (six) hours as needed for muscle spasms. (Patient not taking: Reported on 05/24/2022), Disp: 30 tablet, Rfl: 2   ondansetron (ZOFRAN) 8 MG tablet, Take 1 tablet (8 mg total) by mouth 2 (two) times daily as needed (Nausea or vomiting). (Patient not taking: Reported  on 05/24/2022), Disp: 30 tablet, Rfl: 1   oxyCODONE-acetaminophen (PERCOCET) 10-325 MG tablet, Take 1 tablet by mouth every 4 (four) hours as needed for pain. (Patient not taking: Reported on 05/24/2022), Disp: , Rfl:    oxyCODONE-acetaminophen (PERCOCET/ROXICET) 5-325 MG tablet, Take 1-2 tablets by mouth every 4 (four) hours as needed for severe pain. (Patient not taking: Reported on 05/24/2022), Disp: 30 tablet, Rfl: 0   prochlorperazine (COMPAZINE) 10 MG tablet, Take 1 tablet (10 mg total) by mouth every 6 (six) hours as needed (Nausea or vomiting). (Patient not taking: Reported on 05/24/2022), Disp: 30 tablet, Rfl: 1  Allergies: Not on File  Past Medical History, Surgical history, Social history, and Family History were reviewed and updated.  Review of Systems: Review of Systems  Constitutional: Negative.   HENT:  Negative.    Eyes: Negative.   Respiratory: Negative.    Cardiovascular: Negative.   Gastrointestinal: Negative.   Endocrine: Negative.   Genitourinary:  Positive for difficulty urinating. Negative for bladder incontinence.   Musculoskeletal:  Positive for neck pain.  Skin: Negative.   Neurological: Negative.   Hematological: Negative.   Psychiatric/Behavioral: Negative.      Physical Exam:  height is _0  (1.778 m) and weight is 181 lb 12.8 oz (82.5 kg). His oral temperature is 98.2 F (36.8 C). His blood pressure is 133/93 (abnormal) and his pulse is 81. His respiration is 20 and oxygen saturation is 100%.   Wt Readings from Last 3 Encounters:  06/02/22 181 lb 12.8 oz (82.5 kg)  05/24/22 183 lb 9.6 oz (83.3 kg)  05/13/22 180 lb (81.6 kg)    Physical Exam Vitals reviewed.  HENT:     Head: Normocephalic and atraumatic.  Eyes:     Pupils: Pupils are equal, round, and reactive to light.  Neck:     Comments: His neck exam does show a cervical collar.  I did not take this off. Cardiovascular:     Rate and Rhythm: Normal rate and regular rhythm.     Heart sounds:  Normal heart sounds.  Pulmonary:     Effort: Pulmonary effort is normal.     Breath sounds: Normal breath sounds.  Abdominal:     General: Bowel sounds are normal.     Palpations: Abdomen is soft.  Genitourinary:    Comments: Currently, he has an indwelling Foley catheter in. Musculoskeletal:        General: No tenderness or deformity. Normal range of motion.     Cervical back: Normal range of motion.  Lymphadenopathy:     Cervical: No cervical adenopathy.  Skin:    General: Skin is warm and dry.  Findings: No erythema or rash.  Neurological:     Mental Status: He is alert and oriented to person, place, and time.  Psychiatric:        Behavior: Behavior normal.        Thought Content: Thought content normal.        Judgment: Judgment normal.      Lab Results  Component Value Date   WBC 9.1 06/02/2022   HGB 11.0 (L) 06/02/2022   HCT 33.5 (L) 06/02/2022   MCV 90.5 06/02/2022   PLT 238 06/02/2022     Chemistry      Component Value Date/Time   NA 133 (L) 06/02/2022 0929   K 4.7 06/02/2022 0929   CL 102 06/02/2022 0929   CO2 22 06/02/2022 0929   BUN 13 06/02/2022 0929   CREATININE 1.11 06/02/2022 0929      Component Value Date/Time   CALCIUM 8.6 (L) 06/02/2022 0929   ALKPHOS 75 06/02/2022 0929   AST 9 (L) 06/02/2022 0929   ALT 14 06/02/2022 0929   BILITOT 0.8 06/02/2022 0929      Impression and Plan: Mr. Dinkins is a very nice 66 year old white male.  He has IgA kappa myeloma.  This is certainly quite a problem for him given that he had this fracture at C7.  It was causing a little bit of cord impingement.  He had surgery.  This helped release the pressure on his cord.  I think he is doing well.  I suspect he is probably is responding.  We just need to make an adjustment with his protocol.  I will stop the Decadron for right now.  I will start him on glipizide (2.5 mg p.o. twice daily) that he can take with the metformin to try to help with his blood  sugars.  He will have his bone marrow biopsy done on August 1.  At that time, we will be able to get the chromosomes and FISH studies for the myeloma.  We easily can monitor his blood work and urine to detect a response.  He will see me in about 3 or 4 weeks.  He will continue on his weekly treatment with the Faspro and Velcade.  Ultimately, our goal is to get him to stem cell transplant.  I think if we get him into a very good remission, we can achieve a long-term remission with stem cell transplant.   Volanda Napoleon, MD 7/19/202310:57 AM

## 2022-06-02 NOTE — Progress Notes (Signed)
Patient will have decadron omitted from his treatment plan due to uncontrolled hyperglycemia and side effects. He will have his Bone Marrow completed on 06/15/2022.  Oncology Nurse Navigator Documentation     06/02/2022   10:00 AM  Oncology Nurse Navigator Flowsheets  Navigator Follow Up Date: 06/15/2022  Navigator Follow Up Reason: Other:  Navigator Location [facility]  Navigator Encounter Type Follow-up Appt;Appt/Treatment Plan Review  Patient Visit Type MedOnc  Treatment Phase Active Tx  Barriers/Navigation Needs Coordination of Care;Education  Interventions Psycho-Social Support  Acuity Level 2-Minimal Needs (1-2 Barriers Identified)  Support Groups/Services Friends and Family  Time Spent with Patient 15

## 2022-06-04 ENCOUNTER — Telehealth: Payer: Self-pay | Admitting: Hematology & Oncology

## 2022-06-04 ENCOUNTER — Telehealth: Payer: Self-pay | Admitting: *Deleted

## 2022-06-04 ENCOUNTER — Other Ambulatory Visit: Payer: Self-pay | Admitting: *Deleted

## 2022-06-04 ENCOUNTER — Other Ambulatory Visit: Payer: Self-pay | Admitting: Hematology & Oncology

## 2022-06-04 DIAGNOSIS — C9000 Multiple myeloma not having achieved remission: Secondary | ICD-10-CM

## 2022-06-04 MED ORDER — DILTIAZEM GEL 2 %
1.0000 | Freq: Two times a day (BID) | CUTANEOUS | 5 refills | Status: DC
Start: 2022-06-04 — End: 2022-08-12

## 2022-06-04 MED ORDER — DILTIAZEM GEL 2 %: 1.0000 | Freq: Two times a day (BID) | CUTANEOUS | 5 refills | Status: DC

## 2022-06-04 NOTE — Telephone Encounter (Signed)
Call received from patient and patient's wife stating that pt continues to have the "urge to have a bowel movement" despite having a "somewhat loose" bowel movement daily and that he is taking Lactulose every 8 hours times one week. Pt states that he does have abd cramping at times and  that he is eating and drinking without difficulty.  Pt states that he has had the feeling of having the "urge to have a bowel movement" since April and was diagnosed with tenesmus by his GI MD at [facility] who is no longer at that practice. Dr. Marin Olp notified of above. Pt instructed that Dr. Marin Olp would like for him to try Dilitiazem 2% gel twice a day rectally and to call office back on Monday to inform Dr. Marin Olp if gel has helped.  Pt.'s wife then voiced concern that the Diflucan tablets that were sent in earlier this week have not helped pt.'s rash at this time.  Informed pt.'s wife to complete Diflucan prescription and let us know on Monday's call how rash is as well.  Pt and pt.'s wife are appreciative of assistance and have no further questions at this time.

## 2022-06-04 NOTE — Telephone Encounter (Signed)
Called to schedule per 7/19 los, left voicemail requesting patient to call back to schedule

## 2022-06-07 ENCOUNTER — Other Ambulatory Visit: Payer: Self-pay

## 2022-06-07 MED ORDER — FLUCONAZOLE 100 MG PO TABS: 100.0000 mg | ORAL_TABLET | Freq: Every day | ORAL | 0 refills | Status: DC

## 2022-06-07 MED ORDER — LACTULOSE ENCEPHALOPATHY 10 GM/15ML PO SOLN: ORAL | 1 refills | Status: DC

## 2022-06-07 NOTE — Telephone Encounter (Signed)
Patients wife called and wanted to let us know his rash was better but is out of fluconazole and needed to know if he needed more, also needs a refill on his lactulose and wanted to let Dr.Ennever know his right food gout flared up and wanted to make sure he could taking ibuprofen for it.   Refilled lactulose and fluconazole per MD.. and pt ok to take ibuprofen for gout flare up and follow up with his pcp for further management of his gout. Informed pts wife. She declines any other questions or concerns at this time.

## 2022-06-08 ENCOUNTER — Telehealth: Payer: Self-pay | Admitting: *Deleted

## 2022-06-08 ENCOUNTER — Other Ambulatory Visit: Payer: Self-pay

## 2022-06-08 NOTE — Telephone Encounter (Signed)
Faxed referral to Dr. Creta Levin office

## 2022-06-09 ENCOUNTER — Ambulatory Visit
Admission: RE | Admit: 2022-06-09 | Discharge: 2022-06-09 | Disposition: A | Discharge status: Home / Self Care | Payer: Medicare Other | Source: Ambulatory Visit | Attending: Radiation Oncology | Admitting: Radiation Oncology

## 2022-06-09 ENCOUNTER — Other Ambulatory Visit: Payer: Self-pay | Admitting: *Deleted

## 2022-06-09 ENCOUNTER — Other Ambulatory Visit: Payer: Self-pay

## 2022-06-09 DIAGNOSIS — C9000 Multiple myeloma not having achieved remission: Secondary | ICD-10-CM

## 2022-06-10 ENCOUNTER — Other Ambulatory Visit: Payer: Self-pay

## 2022-06-10 ENCOUNTER — Inpatient Hospital Stay: Payer: Medicare Other

## 2022-06-10 VITALS — BP 128/58 | HR 86 | Temp 97.8°F | Resp 18

## 2022-06-10 DIAGNOSIS — C9000 Multiple myeloma not having achieved remission: Secondary | ICD-10-CM

## 2022-06-10 DIAGNOSIS — Z5112 Encounter for antineoplastic immunotherapy: Secondary | ICD-10-CM | POA: Diagnosis not present

## 2022-06-10 LAB — COMPREHENSIVE METABOLIC PANEL
ALT: 14 U/L (ref 0–44)
AST: 9 U/L — ABNORMAL LOW (ref 15–41)
Albumin: 3.7 g/dL (ref 3.5–5.0)
Alkaline Phosphatase: 122 U/L (ref 38–126)
Anion gap: 9 (ref 5–15)
BUN: 13 mg/dL (ref 8–23)
CO2: 22 mmol/L (ref 22–32)
Calcium: 8.6 mg/dL — ABNORMAL LOW (ref 8.9–10.3)
Chloride: 105 mmol/L (ref 98–111)
Creatinine, Ser: 1.11 mg/dL (ref 0.61–1.24)
GFR, Estimated: >60 mL/min (ref >60)
Glucose, Bld: 257 mg/dL — ABNORMAL HIGH (ref 70–99)
Potassium: 4.7 mmol/L (ref 3.5–5.1)
Sodium: 136 mmol/L (ref 135–145)
Total Bilirubin: 0.6 mg/dL (ref 0.3–1.2)
Total Protein: 5.9 g/dL — ABNORMAL LOW (ref 6.5–8.1)

## 2022-06-10 LAB — CBC WITH DIFFERENTIAL (CANCER CENTER ONLY)
Abs Immature Granulocytes: 0.04 10*3/uL (ref 0.00–0.07)
Basophils Absolute: 0.1 10*3/uL (ref 0.0–0.1)
Basophils Relative: 1 %
Eosinophils Absolute: 0.2 10*3/uL (ref 0.0–0.5)
Eosinophils Relative: 4 %
HCT: 33.5 % — ABNORMAL LOW (ref 39.0–52.0)
Hemoglobin: 11 g/dL — ABNORMAL LOW (ref 13.0–17.0)
Immature Granulocytes: 1 %
Lymphocytes Relative: 7 %
Lymphs Abs: 0.3 10*3/uL — ABNORMAL LOW (ref 0.7–4.0)
MCH: 30.1 pg (ref 26.0–34.0)
MCHC: 32.8 g/dL (ref 30.0–36.0)
MCV: 91.5 fL (ref 80.0–100.0)
Monocytes Absolute: 0.5 10*3/uL (ref 0.1–1.0)
Monocytes Relative: 11 %
Neutro Abs: 3.2 10*3/uL (ref 1.7–7.7)
Neutrophils Relative %: 76 %
Platelet Count: 218 10*3/uL (ref 150–400)
RBC: 3.66 MIL/uL — ABNORMAL LOW (ref 4.22–5.81)
RDW: 13.2 % (ref 11.5–15.5)
WBC Count: 4.2 10*3/uL (ref 4.0–10.5)
nRBC: 0 % (ref 0.0–0.2)

## 2022-06-10 MED ORDER — DIPHENHYDRAMINE HCL 25 MG PO CAPS
50.0000 mg | ORAL_CAPSULE | Freq: Once | ORAL | Status: AC
  Administered 2022-06-10: 50 mg via ORAL
  Filled 2022-06-10: qty 2

## 2022-06-10 MED ORDER — ACETAMINOPHEN 325 MG PO TABS
650.0000 mg | ORAL_TABLET | Freq: Once | ORAL | Status: AC
  Administered 2022-06-10: 650 mg via ORAL
  Filled 2022-06-10: qty 2

## 2022-06-10 MED ORDER — DARATUMUMAB-HYALURONIDASE-FIHJ 1800-30000 MG-UT/15ML ~~LOC~~ SOLN
1800.0000 mg | Freq: Once | SUBCUTANEOUS | Status: AC
  Administered 2022-06-10: 1800 mg via SUBCUTANEOUS
  Filled 2022-06-10: qty 15

## 2022-06-10 NOTE — Patient Instructions (Signed)
[facility]  Discharge Instructions: Thank you for choosing [facility] to provide your oncology and hematology care.   If you have a lab appointment with the [facility], please go directly to the [facility] and check in at the registration area.  Wear comfortable clothing and clothing appropriate for easy access to any Portacath or PICC line.   We strive to give you quality time with your provider. You may need to reschedule your appointment if you arrive late (15 or more minutes).  Arriving late affects you and other patients whose appointments are after yours.  Also, if you miss three or more appointments without notifying the office, you may be dismissed from the clinic at the provider's discretion.      For prescription refill requests, have your pharmacy contact our office and allow 72 hours for refills to be completed.    Today you received the following chemotherapy and/or immunotherapy agents Darzalex.      To help prevent nausea and vomiting after your treatment, we encourage you to take your nausea medication as directed.  BELOW ARE SYMPTOMS THAT SHOULD BE REPORTED IMMEDIATELY: *FEVER GREATER THAN 100.4 F (38 C) OR HIGHER *CHILLS OR SWEATING *NAUSEA AND VOMITING THAT IS NOT CONTROLLED WITH YOUR NAUSEA MEDICATION *UNUSUAL SHORTNESS OF BREATH *UNUSUAL BRUISING OR BLEEDING *URINARY PROBLEMS (pain or burning when urinating, or frequent urination) *BOWEL PROBLEMS (unusual diarrhea, constipation, pain near the anus) TENDERNESS IN MOUTH AND THROAT WITH OR WITHOUT PRESENCE OF ULCERS (sore throat, sores in mouth, or a toothache) UNUSUAL RASH, SWELLING OR PAIN  UNUSUAL VAGINAL DISCHARGE OR ITCHING   Items with * indicate a potential emergency and should be followed up as soon as possible or go to the Emergency Department if any problems should occur.  Please show the CHEMOTHERAPY ALERT CARD or IMMUNOTHERAPY ALERT CARD at check-in to the  Emergency Department and triage nurse. Should you have questions after your visit or need to cancel or reschedule your appointment, please contact [facility]  [phone] and follow the prompts.  Office hours are 8:00 a.m. to 4:30 p.m. Monday - Friday. Please note that voicemails left after 4:00 p.m. may not be returned until the following business day.  We are closed weekends and major holidays. You have access to a nurse at all times for urgent questions. Please call the main number to the clinic [phone] and follow the prompts.  For any non-urgent questions, you may also contact your provider using MyChart. We now offer e-Visits for anyone 13 and older to request care online for non-urgent symptoms. For details visit [email or web address].   Also download the MyChart app! Go to the app store, search "MyChart", open the app, select [facility], and log in with your MyChart username and password.  Masks are optional in the cancer centers. If you would like for your care team to wear a mask while they are taking care of you, please let them know. For doctor visits, patients may have with them one support person who is at least 66 years old. At this time, visitors are not allowed in the infusion area.

## 2022-06-10 NOTE — Progress Notes (Signed)
Patient refused to wait 1 hour after the shot. Released stable and ASX.

## 2022-06-11 ENCOUNTER — Telehealth: Payer: Self-pay

## 2022-06-11 ENCOUNTER — Other Ambulatory Visit: Payer: Self-pay | Admitting: *Deleted

## 2022-06-11 MED ORDER — LACTULOSE ENCEPHALOPATHY 10 GM/15ML PO SOLN: ORAL | 1 refills | Status: DC

## 2022-06-11 NOTE — Telephone Encounter (Signed)
Received call from patient requesting medication for anxiety prior to start radiation treatments on 8/3. Noted patient has prescription for ativan 0.'5mg'$ , however patient states he does not think that medication will help with his anxiety. Patient requesting medication be sent to [facility] on [address].

## 2022-06-13 DIAGNOSIS — C9000 Multiple myeloma not having achieved remission: Secondary | ICD-10-CM | POA: Diagnosis not present

## 2022-06-14 ENCOUNTER — Other Ambulatory Visit: Payer: Self-pay | Admitting: Radiation Oncology

## 2022-06-14 ENCOUNTER — Other Ambulatory Visit: Payer: Self-pay | Admitting: Radiology

## 2022-06-14 DIAGNOSIS — C9000 Multiple myeloma not having achieved remission: Secondary | ICD-10-CM

## 2022-06-14 MED ORDER — LORAZEPAM 0.5 MG PO TABS
0.5000 mg | ORAL_TABLET | Freq: Three times a day (TID) | ORAL | 0 refills | Status: DC
  Filled 2022-06-14: qty 30, 10d supply, fill #0

## 2022-06-14 NOTE — Telephone Encounter (Signed)
Called [facility] to check status of Ativan RX, per pharmacy tech Ativan is on back order at [facility].  Called and made patient aware that new prescription for ativan will be sent to [facility] outpatient pharmacy. Patient voiced understanding.

## 2022-06-15 ENCOUNTER — Other Ambulatory Visit (HOSPITAL_BASED_OUTPATIENT_CLINIC_OR_DEPARTMENT_OTHER): Payer: Self-pay

## 2022-06-15 ENCOUNTER — Encounter (HOSPITAL_COMMUNITY): Payer: Self-pay

## 2022-06-15 ENCOUNTER — Ambulatory Visit (HOSPITAL_COMMUNITY)
Admission: RE | Admit: 2022-06-15 | Discharge: 2022-06-15 | Disposition: A | Discharge status: Home / Self Care | Payer: Medicare Other | Source: Ambulatory Visit | Attending: Hematology & Oncology | Admitting: Hematology & Oncology

## 2022-06-15 ENCOUNTER — Other Ambulatory Visit (HOSPITAL_COMMUNITY): Payer: Self-pay

## 2022-06-15 DIAGNOSIS — Z31448 Encounter for other genetic testing of male for procreative management: Secondary | ICD-10-CM | POA: Insufficient documentation

## 2022-06-15 DIAGNOSIS — Z01812 Encounter for preprocedural laboratory examination: Secondary | ICD-10-CM | POA: Diagnosis present

## 2022-06-15 DIAGNOSIS — D649 Anemia, unspecified: Secondary | ICD-10-CM | POA: Diagnosis not present

## 2022-06-15 DIAGNOSIS — C9000 Multiple myeloma not having achieved remission: Secondary | ICD-10-CM | POA: Diagnosis not present

## 2022-06-15 DIAGNOSIS — E119 Type 2 diabetes mellitus without complications: Secondary | ICD-10-CM | POA: Diagnosis not present

## 2022-06-15 DIAGNOSIS — D72829 Elevated white blood cell count, unspecified: Secondary | ICD-10-CM | POA: Insufficient documentation

## 2022-06-15 DIAGNOSIS — Z7984 Long term (current) use of oral hypoglycemic drugs: Secondary | ICD-10-CM | POA: Diagnosis not present

## 2022-06-15 LAB — CBC WITH DIFFERENTIAL/PLATELET
Abs Immature Granulocytes: 0.05 10*3/uL (ref 0.00–0.07)
Basophils Absolute: 0.1 10*3/uL (ref 0.0–0.1)
Basophils Relative: 1 %
Eosinophils Absolute: 0.1 10*3/uL (ref 0.0–0.5)
Eosinophils Relative: 1 %
HCT: 33.8 % — ABNORMAL LOW (ref 39.0–52.0)
Hemoglobin: 11.3 g/dL — ABNORMAL LOW (ref 13.0–17.0)
Immature Granulocytes: 1 %
Lymphocytes Relative: 9 %
Lymphs Abs: 1 10*3/uL (ref 0.7–4.0)
MCH: 29.9 pg (ref 26.0–34.0)
MCHC: 33.4 g/dL (ref 30.0–36.0)
MCV: 89.4 fL (ref 80.0–100.0)
Monocytes Absolute: 1.4 10*3/uL — ABNORMAL HIGH (ref 0.1–1.0)
Monocytes Relative: 13 %
Neutro Abs: 8.2 10*3/uL — ABNORMAL HIGH (ref 1.7–7.7)
Neutrophils Relative %: 75 %
Platelets: 336 10*3/uL (ref 150–400)
RBC: 3.78 MIL/uL — ABNORMAL LOW (ref 4.22–5.81)
RDW: 13.2 % (ref 11.5–15.5)
WBC: 10.8 10*3/uL — ABNORMAL HIGH (ref 4.0–10.5)
nRBC: 0 % (ref 0.0–0.2)

## 2022-06-15 LAB — GLUCOSE, CAPILLARY: Glucose-Capillary: 177 mg/dL — ABNORMAL HIGH (ref 70–99)

## 2022-06-15 MED ORDER — LIDOCAINE HCL (PF) 1 % IJ SOLN
INTRAMUSCULAR | Status: AC | PRN
  Administered 2022-06-15: 20 mL

## 2022-06-15 MED ORDER — SODIUM CHLORIDE 0.9 % IV SOLN: INTRAVENOUS | Status: DC

## 2022-06-15 MED ORDER — FENTANYL CITRATE (PF) 100 MCG/2ML IJ SOLN
INTRAMUSCULAR | Status: AC
  Filled 2022-06-15: qty 4

## 2022-06-15 MED ORDER — FENTANYL CITRATE (PF) 100 MCG/2ML IJ SOLN
INTRAMUSCULAR | Status: AC | PRN
  Administered 2022-06-15 (×3): 50 ug via INTRAVENOUS

## 2022-06-15 NOTE — Procedures (Signed)
Interventional Radiology Procedure Note  Procedure: CT BM ASP AND CORE    Complications: None  Estimated Blood Loss:  MIN  Findings: 11 G CORE AND ASP    M. TREVOR Colen Eltzroth, MD    

## 2022-06-15 NOTE — Consult Note (Signed)
Chief Complaint: Patient was seen in consultation today for CT-guided bone marrow biopsy  Referring Physician(s): Ennever,Peter R  Supervising Physician: Daryll Brod  Patient Status: [facility] - Out-pt  History of Present Illness: Barry Gray is a 66 y.o. male with past medical history significant for diabetes, nephrolithiasis, prior cervical discectomy/decompression with four level fusion in June of this year and IgA kappa myeloma.  He presents today for CT-guided bone marrow biopsy for further evaluation.  Past Medical History:  Diagnosis Date   Diabetes mellitus    Fracture of fibula, distal 10/2011   w/ mild posterior displacement of distal fracture fragments   Kidney stone    Multiple myeloma ([place]) 05/07/2022    Past Surgical History:  Procedure Laterality Date   ANTERIOR CERVICAL DECOMPRESSION/DISCECTOMY FUSION 4 LEVELS N/A 04/28/2022   Procedure: ANTERIOR CERVICAL DECOMPRESSION FUSION CERVICAL 5- CERVICAL 6, CERVICAL 6- CERVICAL 7, CERVICAL 7 - THORACIC 1 , Thoracic 1 - thoracic 2 WITH CERVICAL 7 CORPECTOMY WITH INSTRUMENTATION AND ALLOGRAFT;  Surgeon: Phylliss Bob, MD;  Location: [facility];  Service: Orthopedics;  Laterality: N/A;   COLONOSCOPY     POSTERIOR CERVICAL FUSION/FORAMINOTOMY  04/28/2022   Procedure: POSTERIOR CERVICAL FUSION/FORAMINOTOMY LEVEL 3;  Surgeon: Phylliss Bob, MD;  Location: [facility];  Service: Orthopedics;;    Allergies: Patient has no allergy information on record.  Medications: Prior to Admission medications   Medication Sig Start Date End Date Taking? Authorizing Provider  aspirin 325 MG tablet Take 325 mg by mouth daily.    [provider]  Cyanocobalamin (B-12 PO) Take 1 tablet by mouth daily. Patient not taking: Reported on 06/02/2022    [provider]  diltiazem 2 % GEL Apply 1 Application topically 2 (two) times daily. 06/04/22   Volanda Napoleon, MD  famciclovir (FAMVIR) 250 MG tablet Take 1 tablet (250 mg total) by  mouth daily. 05/07/22   Volanda Napoleon, MD  fluconazole (DIFLUCAN) 100 MG tablet Take 1 tablet (100 mg total) by mouth daily. 06/07/22   Volanda Napoleon, MD  glipiZIDE (GLUCOTROL) 5 MG tablet Take 0.5 tablets (2.5 mg total) by mouth 2 (two) times daily before a meal. 06/02/22   Ennever, Rudell Cobb, MD  lactulose, encephalopathy, (CHRONULAC) 10 GM/15ML SOLN Take 20 mls by mouth every 8 hours as needed 06/11/22   Volanda Napoleon, MD  lenalidomide (REVLIMID) 25 MG capsule Take one capsule by mouth for 21 days ON, then OFF for seven days. Fanny Dance #[id number]  Date Obtained 05/31/22 Patient not taking: Reported on 06/02/2022 05/31/22   Volanda Napoleon, MD  lisinopril (ZESTRIL) 10 MG tablet Take 10 mg by mouth daily as needed (blood pressure of 160/85). Patient not taking: Reported on 06/02/2022 04/16/22   [provider]  LORazepam (ATIVAN) 0.5 MG tablet Take 1 tablet (0.5 mg total) by mouth every 6 (six) hours as needed (Nausea or vomiting). Patient not taking: Reported on 05/24/2022 05/07/22   Volanda Napoleon, MD  LORazepam (ATIVAN) 0.5 MG tablet Take 1 tablet (0.5 mg total) by mouth every 8 (eight) hours as needed for anxiety or nausea, can take 2 tablets prior to radiation treatment 06/14/22   Gery Pray, MD  metFORMIN (GLUCOPHAGE) 500 MG tablet Take 500 mg by mouth 2 (two) times daily with a meal.  Patient not taking: Reported on 06/02/2022    [provider]  methocarbamol (ROBAXIN) 500 MG tablet Take 1 tablet (500 mg total) by mouth every 6 (six) hours as needed for  muscle spasms. Patient not taking: Reported on 05/24/2022 04/29/22   Phylliss Bob, MD  montelukast (SINGULAIR) 10 MG tablet Take 1 tablet (10 mg total) by mouth at bedtime. Start Singulair 2 days before you start the chemotherapy in July. 05/10/22   Volanda Napoleon, MD  ondansetron (ZOFRAN) 8 MG tablet Take 1 tablet (8 mg total) by mouth 2 (two) times daily as needed (Nausea or vomiting). Patient not taking: Reported on  05/24/2022 05/07/22   Volanda Napoleon, MD  oxyCODONE-acetaminophen (PERCOCET) 10-325 MG tablet Take 1 tablet by mouth every 4 (four) hours as needed for pain. Patient not taking: Reported on 05/24/2022 04/19/22   [provider]  oxyCODONE-acetaminophen (PERCOCET/ROXICET) 5-325 MG tablet Take 1-2 tablets by mouth every 4 (four) hours as needed for severe pain. Patient not taking: Reported on 05/24/2022 04/29/22   Phylliss Bob, MD  polyethylene glycol (MIRALAX / GLYCOLAX) 17 g packet Take 17 g by mouth daily as needed for moderate constipation.    [provider]  prochlorperazine (COMPAZINE) 10 MG tablet Take 1 tablet (10 mg total) by mouth every 6 (six) hours as needed (Nausea or vomiting). Patient not taking: Reported on 05/24/2022 05/07/22   Volanda Napoleon, MD  tamsulosin (FLOMAX) 0.4 MG CAPS capsule Take 0.4 mg by mouth daily. 05/06/22   [provider]     Family History  Problem Relation Age of Onset   Cancer Mother     Social History   Socioeconomic History   Marital status: Married    Spouse name: Not on file   Number of children: Not on file   Years of education: Not on file   Highest education level: Not on file  Occupational History   Not on file  Tobacco Use   Smoking status: Former    Packs/day: 1.00    Years: 15.00    Total pack years: 15.00    Types: Cigarettes    Quit date: 9    Years since quitting: 33.6   Smokeless tobacco: Never  Vaping Use   Vaping Use: Never used  Substance and Sexual Activity   Alcohol use: No   Drug use: No   Sexual activity: Not Currently  Other Topics Concern   Not on file  Social History Narrative   Not on file   Social Determinants of Health   Financial Resource Strain: Not on file  Food Insecurity: Not on file  Transportation Needs: No Transportation Needs (05/24/2022)   PRAPARE - Hydrologist (Medical): No    Lack of Transportation (Non-Medical): No  Physical  Activity: Inactive (05/24/2022)   Exercise Vital Sign    Days of Exercise per Week: 0 days    Minutes of Exercise per Session: 0 min  Stress: Not on file  Social Connections: Not on file      Review of Systems currently denies fever, headache, chest pain, dyspnea, cough, abdominal pain, nausea, vomiting or bleeding; does have some upper back/neck/shoulder discomfort  Vital Signs: BP 139/75   Pulse 85   Temp 97.6 F (36.4 C) (Oral)   Resp 18   Ht 5' 10" (1.778 m)   Wt 180 lb 12.4 oz (82 kg)   SpO2 100%   BMI 25.94 kg/m      Physical Exam awake, alert.  Chest clear to auscultation bilaterally.  Heart with regular rate and rhythm.  Abdomen soft, positive bowel sounds, nontender.  Right greater than left pretibial edema.  Imaging: No results  found.  Labs:  CBC: Recent Labs    05/20/22 0915 05/27/22 0930 06/02/22 0929 06/10/22 0931  WBC 8.0 9.5 9.1 4.2  HGB 10.7* 11.9* 11.0* 11.0*  HCT 32.8* 36.1* 33.5* 33.5*  PLT 366 265 238 218    COAGS: No results for input(s): "INR", "APTT" in the last 8760 hours.  BMP: Recent Labs    05/20/22 0915 05/27/22 0930 06/02/22 0929 06/10/22 0931  NA 139 133* 133* 136  K 4.1 4.2 4.7 4.7  CL 103 100 102 105  CO2 24 20* 22 22  GLUCOSE 174* 257* 200* 257*  BUN _0 CALCIUM 9.6 9.3 8.6* 8.6*  CREATININE 1.16 1.28* 1.11 1.11  GFRNONAA >60 >60 >60 >60    LIVER FUNCTION TESTS: Recent Labs    05/20/22 0915 05/27/22 0930 06/02/22 0929 06/10/22 0931  BILITOT 0.6 0.8 0.8 0.6  AST 10* 10* 9* 9*  ALT _1 ALKPHOS 63 60 75 122  PROT 7.1 6.6 6.2* 5.9*  ALBUMIN 4.1 4.2 4.1 3.7    TUMOR MARKERS: No results for input(s): "AFPTM", "CEA", "CA199", "CHROMGRNA" in the last 8760 hours.  Assessment and Plan: 66 y.o. male with past medical history significant for diabetes, nephrolithiasis, prior cervical discectomy/decompression with four level fusion in June of this year and IgA kappa myeloma.  He presents today  for CT-guided bone marrow biopsy for further evaluation.Risks and benefits of procedure was discussed with the patient  including, but not limited to bleeding, infection, damage to adjacent structures or low yield requiring additional tests.  All of the questions were answered and there is agreement to proceed.  Consent signed and in chart.  Patient had food to eat this morning at 6:45 am ,therefore IV fentanyl only will be utilized for case    Thank you for this interesting consult.  I greatly enjoyed meeting Barry Gray and look forward to participating in their care.  A copy of this report was sent to the requesting provider on this date.  Electronically Signed: D. Rowe Robert, PA-C 06/15/2022, 9:32 AM   I spent a total of  20 minutes   in face to face in clinical consultation, greater than 50% of which was counseling/coordinating care for CT-guided bone marrow biopsy

## 2022-06-15 NOTE — Discharge Instructions (Signed)
For questions /concerns may call Interventional Radiology at [phone] or  Interventional Radiology clinic [phone]   You may remove your dressing and shower tomorrow afternoon  Moderate Conscious Sedation, Adult, Care After This sheet gives you information about how to care for yourself after your procedure. Your health care provider may also give you more specific instructions. If you have problems or questions, contact your health careprovider. What can I expect after the procedure? After the procedure, it is common to have: Sleepiness for several hours. Impaired judgment for several hours. Difficulty with balance. Vomiting if you eat too soon. Follow these instructions at home: For the time period you were told by your health care provider: Rest. Do not participate in activities where you could fall or become injured. Do not drive or use machinery. Do not drink alcohol. Do not take sleeping pills or medicines that cause drowsiness. Do not make important decisions or sign legal documents. Do not take care of children on your own. Eating and drinking  Follow the diet recommended by your health care provider. Drink enough fluid to keep your urine pale yellow. If you vomit: Drink water, juice, or soup when you can drink without vomiting. Make sure you have little or no nausea before eating solid foods.  General instructions Take over-the-counter and prescription medicines only as told by your health care provider. Have a responsible adult stay with you for the time you are told. It is important to have someone help care for you until you are awake and alert. Do not smoke. Keep all follow-up visits as told by your health care provider. This is important. Contact a health care provider if: You are still sleepy or having trouble with balance after 24 hours. You feel light-headed. You keep feeling nauseous or you keep vomiting. You develop a rash. You have a fever. You have  redness or swelling around the IV site. Get help right away if: You have trouble breathing. You have new-onset confusion at home. Summary After the procedure, it is common to feel sleepy, have impaired judgment, or feel nauseous if you eat too soon. Rest after you get home. Know the things you should not do after the procedure. Follow the diet recommended by your health care provider and drink enough fluid to keep your urine pale yellow. Get help right away if you have trouble breathing or new-onset confusion at home. This information is not intended to replace advice given to you by your health care provider. Make sure you discuss any questions you have with your healthcare provider. Document Revised: 02/29/2020 Document Reviewed: 09/27/2019 Elsevier Patient Education  2022 [place]. Bone Marrow Aspiration and Bone Marrow Biopsy, Adult, Care After This sheet gives you information about how to care for yourself after your procedure. Your health care provider may also give you more specific instructions. If you have problems or questions, contact your health careprovider. What can I expect after the procedure? After the procedure, it is common to have: Mild pain and tenderness. Swelling. Bruising. Follow these instructions at home: Puncture site care Follow instructions from your health care provider about how to take care of the puncture site. Make sure you: Wash your hands with soap and water before and after you change your bandage (dressing). If soap and water are not available, use hand sanitizer. Change your dressing as told by your health care provider. Check your puncture site every day for signs of infection. Check for: More redness, swelling, or pain. Fluid or blood. Warmth.  Pus or a bad smell.  Activity Return to your normal activities as told by your health care provider. Ask your health care provider what activities are safe for you. Do not lift anything that is  heavier than 10 lb (4.5 kg), or the limit that you are told, until your health care provider says that it is safe. Do not drive for 24 hours if you were given a sedative during your procedure. General instructions Take over-the-counter and prescription medicines only as told by your health care provider. Do not take baths, swim, or use a hot tub until your health care provider approves. Ask your health care provider if you may take showers. You may only be allowed to take sponge baths. If directed, put ice on the affected area. To do this: Put ice in a plastic bag. Place a towel between your skin and the bag. Leave the ice on for 20 minutes, 2-3 times a day. Keep all follow-up visits as told by your health care provider. This is important.  Contact a health care provider if: Your pain is not controlled with medicine. You have a fever. You have more redness, swelling, or pain around the puncture site. You have fluid or blood coming from the puncture site. Your puncture site feels warm to the touch. You have pus or a bad smell coming from the puncture site. Summary After the procedure, it is common to have mild pain, tenderness, swelling, and bruising. Follow instructions from your health care provider about how to take care of the puncture site and what activities are safe for you. Take over-the-counter and prescription medicines only as told by your health care provider. Contact a health care provider if you have any signs of infection, such as fluid or blood coming from the puncture site. This information is not intended to replace advice given to you by your health care provider. Make sure you discuss any questions you have with your healthcare provider. Document Revised: 03/20/2019 Document Reviewed: 03/20/2019 Elsevier Patient Education  [place].

## 2022-06-16 ENCOUNTER — Other Ambulatory Visit: Payer: Self-pay | Admitting: *Deleted

## 2022-06-16 DIAGNOSIS — C9000 Multiple myeloma not having achieved remission: Secondary | ICD-10-CM

## 2022-06-17 ENCOUNTER — Ambulatory Visit
Admission: RE | Admit: 2022-06-17 | Discharge: 2022-06-17 | Disposition: A | Discharge status: Home / Self Care | Payer: Medicare Other | Source: Ambulatory Visit | Attending: Radiation Oncology | Admitting: Radiation Oncology

## 2022-06-17 ENCOUNTER — Inpatient Hospital Stay: Payer: Medicare Other

## 2022-06-17 ENCOUNTER — Inpatient Hospital Stay: Payer: Medicare Other | Attending: Hematology & Oncology

## 2022-06-17 ENCOUNTER — Other Ambulatory Visit: Payer: Self-pay

## 2022-06-17 VITALS — BP 138/70 | HR 84 | Temp 97.6°F | Resp 17 | Ht 70.0 in | Wt 190.0 lb

## 2022-06-17 DIAGNOSIS — C9000 Multiple myeloma not having achieved remission: Secondary | ICD-10-CM

## 2022-06-17 DIAGNOSIS — Z79899 Other long term (current) drug therapy: Secondary | ICD-10-CM | POA: Insufficient documentation

## 2022-06-17 DIAGNOSIS — Z5112 Encounter for antineoplastic immunotherapy: Secondary | ICD-10-CM | POA: Insufficient documentation

## 2022-06-17 LAB — RAD ONC ARIA SESSION SUMMARY
Course Elapsed Days: 0
Plan Fractions Treated to Date: 1
Plan Prescribed Dose Per Fraction: 2.5 Gy
Plan Total Fractions Prescribed: 14
Plan Total Prescribed Dose: 35 Gy
Reference Point Dosage Given to Date: 2.5 Gy
Reference Point Session Dosage Given: 2.5 Gy
Session Number: 1

## 2022-06-17 LAB — COMPREHENSIVE METABOLIC PANEL
ALT: 13 U/L (ref 0–44)
AST: 9 U/L — ABNORMAL LOW (ref 15–41)
Albumin: 3.8 g/dL (ref 3.5–5.0)
Alkaline Phosphatase: 143 U/L — ABNORMAL HIGH (ref 38–126)
Anion gap: 10 (ref 5–15)
BUN: 14 mg/dL (ref 8–23)
CO2: 22 mmol/L (ref 22–32)
Calcium: 8.7 mg/dL — ABNORMAL LOW (ref 8.9–10.3)
Chloride: 107 mmol/L (ref 98–111)
Creatinine, Ser: 0.95 mg/dL (ref 0.61–1.24)
GFR, Estimated: >60 mL/min (ref >60)
Glucose, Bld: 244 mg/dL — ABNORMAL HIGH (ref 70–99)
Potassium: 4.2 mmol/L (ref 3.5–5.1)
Sodium: 139 mmol/L (ref 135–145)
Total Bilirubin: 0.3 mg/dL (ref 0.3–1.2)
Total Protein: 5.8 g/dL — ABNORMAL LOW (ref 6.5–8.1)

## 2022-06-17 LAB — CBC WITH DIFFERENTIAL (CANCER CENTER ONLY)
Abs Immature Granulocytes: 0.04 10*3/uL (ref 0.00–0.07)
Basophils Absolute: 0.2 10*3/uL — ABNORMAL HIGH (ref 0.0–0.1)
Basophils Relative: 2 %
Eosinophils Absolute: 0.1 10*3/uL (ref 0.0–0.5)
Eosinophils Relative: 1 %
HCT: 32 % — ABNORMAL LOW (ref 39.0–52.0)
Hemoglobin: 10.5 g/dL — ABNORMAL LOW (ref 13.0–17.0)
Immature Granulocytes: 1 %
Lymphocytes Relative: 16 %
Lymphs Abs: 1.3 10*3/uL (ref 0.7–4.0)
MCH: 29.8 pg (ref 26.0–34.0)
MCHC: 32.8 g/dL (ref 30.0–36.0)
MCV: 90.9 fL (ref 80.0–100.0)
Monocytes Absolute: 1 10*3/uL (ref 0.1–1.0)
Monocytes Relative: 12 %
Neutro Abs: 5.8 10*3/uL (ref 1.7–7.7)
Neutrophils Relative %: 68 %
Platelet Count: 296 10*3/uL (ref 150–400)
RBC: 3.52 MIL/uL — ABNORMAL LOW (ref 4.22–5.81)
RDW: 13.4 % (ref 11.5–15.5)
WBC Count: 8.5 10*3/uL (ref 4.0–10.5)
nRBC: 0 % (ref 0.0–0.2)

## 2022-06-17 MED ORDER — ACETAMINOPHEN 325 MG PO TABS: 650.0000 mg | ORAL_TABLET | Freq: Once | ORAL | Status: DC

## 2022-06-17 MED ORDER — DARATUMUMAB-HYALURONIDASE-FIHJ 1800-30000 MG-UT/15ML ~~LOC~~ SOLN
1800.0000 mg | Freq: Once | SUBCUTANEOUS | Status: AC
  Administered 2022-06-17: 1800 mg via SUBCUTANEOUS
  Filled 2022-06-17: qty 15

## 2022-06-17 MED ORDER — DIPHENHYDRAMINE HCL 25 MG PO CAPS: 50.0000 mg | ORAL_CAPSULE | Freq: Once | ORAL | Status: DC

## 2022-06-17 MED ORDER — BORTEZOMIB CHEMO SQ INJECTION 3.5 MG (2.5MG/ML)
1.3000 mg/m2 | Freq: Once | INTRAMUSCULAR | Status: AC
  Administered 2022-06-17: 2.75 mg via SUBCUTANEOUS
  Filled 2022-06-17: qty 1.1

## 2022-06-17 NOTE — Patient Instructions (Addendum)
Bortezomib Injection What is this medication? BORTEZOMIB (bor TEZ oh mib) treats lymphoma. It may also be used to treat multiple myeloma, a type of bone marrow cancer. It works by blocking a protein that causes cancer cells to grow and multiply. This helps to slow or stop the spread of cancer cells. This medicine may be used for other purposes; ask your health care provider or pharmacist if you have questions. COMMON BRAND NAME(S): Velcade What should I tell my care team before I take this medication? They need to know if you have any of these conditions: Dehydration Diabetes Heart disease Liver disease Tingling of the fingers or toes or other nerve disorder An unusual or allergic reaction to bortezomib, other medications, foods, dyes, or preservatives If you or your partner are pregnant or trying to get pregnant Breastfeeding How should I use this medication? This medication is injected into a vein or under the skin. It is given by your care team in a hospital or clinic setting. Talk to your care team about the use of this medication in children. Special care may be needed. Overdosage: If you think you have taken too much of this medicine contact a poison control center or emergency room at once. NOTE: This medicine is only for you. Do not share this medicine with others. What if I miss a dose? Keep appointments for follow-up doses. It is important not to miss your dose. Call your care team if you are unable to keep an appointment. What may interact with this medication? Ketoconazole Rifampin This list may not describe all possible interactions. Give your health care provider a list of all the medicines, herbs, non-prescription drugs, or dietary supplements you use. Also tell them if you smoke, drink alcohol, or use illegal drugs. Some items may interact with your medicine. What should I watch for while using this medication? Your condition will be monitored carefully while you are  receiving this medication. You may need blood work while taking this medication. This medication may affect your coordination, reaction time, or judgment. Do not drive or operate machinery until you know how this medication affects you. Sit up or stand slowly to reduce the risk of dizzy or fainting spells. Drinking alcohol with this medication can increase the risk of these side effects. This medication may increase your risk of getting an infection. Call your care team for advice if you get a fever, chills, sore throat, or other symptoms of a cold or flu. Do not treat yourself. Try to avoid being around people who are sick. Check with your care team if you have severe diarrhea, nausea, and vomiting, or if you sweat a lot. The loss of too much body fluid may make it dangerous for you to take this medication. Talk to your care team if you may be pregnant. Serious birth defects can occur if you take this medication during pregnancy and for 7 months after the last dose. You will need a negative pregnancy test before starting this medication. Contraception is recommended while taking this medication and for 7 months after the last dose. Your care team can help you find the option that works for you. If your partner can get pregnant, use a condom during sex while taking this medication and for 4 months after the last dose. Do not breastfeed while taking this medication and for 2 months after the last dose. This medication may cause infertility. Talk to your care team if you are concerned about your fertility. What side effects   may I notice from receiving this medication? Side effects that you should report to your care team as soon as possible: Allergic reactions--skin rash, itching, hives, swelling of the face, lips, tongue, or throat Bleeding--bloody or black, tar-like stools, vomiting blood or brown material that looks like coffee grounds, red or dark brown urine, small red or purple spots on skin, unusual  bruising or bleeding Bleeding in the brain--severe headache, stiff neck, confusion, dizziness, change in vision, numbness or weakness of the face, arm, or leg, trouble speaking, trouble walking, vomiting Bowel blockage--stomach cramping, unable to have a bowel movement or pass gas, loss of appetite, vomiting Heart failure--shortness of breath, swelling of the ankles, feet, or hands, sudden weight gain, unusual weakness or fatigue Infection--fever, chills, cough, sore throat, wounds that don't heal, pain or trouble when passing urine, general feeling of discomfort or being unwell Liver injury--right upper belly pain, loss of appetite, nausea, light-colored stool, dark yellow or brown urine, yellowing skin or eyes, unusual weakness or fatigue Low blood pressure--dizziness, feeling faint or lightheaded, blurry vision Lung injury--shortness of breath or trouble breathing, cough, spitting up blood, chest pain, fever Pain, tingling, or numbness in the hands or feet Severe or prolonged diarrhea Stomach pain, bloody diarrhea, pale skin, unusual weakness or fatigue, decrease in the amount of urine, which may be signs of hemolytic uremic syndrome Sudden and severe headache, confusion, change in vision, seizures, which may be signs of posterior reversible encephalopathy syndrome (PRES) TTP--purple spots on the skin or inside the mouth, pale skin, yellowing skin or eyes, unusual weakness or fatigue, fever, fast or irregular heartbeat, confusion, change in vision, trouble speaking, trouble walking Tumor lysis syndrome (TLS)--nausea, vomiting, diarrhea, decrease in the amount of urine, dark urine, unusual weakness or fatigue, confusion, muscle pain or cramps, fast or irregular heartbeat, joint pain Side effects that usually do not require medical attention (report to your care team if they continue or are bothersome): Constipation Diarrhea Fatigue Loss of appetite Nausea This list may not describe all possible  side effects. Call your doctor for medical advice about side effects. You may report side effects to FDA at 1-800-FDA-1088. Where should I keep my medication? This medication is given in a hospital or clinic. It will not be stored at home. NOTE: This sheet is a summary. It may not cover all possible information. If you have questions about this medicine, talk to your doctor, pharmacist, or health care provider.  2023 Elsevier/Gold Standard (2022-03-31 00:00:00) Daratumumab; Hyaluronidase Injection What is this medication? DARATUMUMAB; HYALURONIDASE (dar a toom ue mab; hye al ur ON i dase) treats multiple myeloma, a type of bone marrow cancer. Daratumumab works by blocking a protein that causes cancer cells to grow and multiply. This helps to slow or stop the spread of cancer cells. Hyaluronidase works by increasing the absorption of other medications in the body to help them work better. This medication may also be used treat amyloidosis, a condition that causes the buildup of a protein (amyloid) in your body. It works by reducing the buildup of this protein, which decreases symptoms. It is a combination medication that contains a monoclonal antibody. This medicine may be used for other purposes; ask your health care provider or pharmacist if you have questions. COMMON BRAND NAME(S): DARZALEX FASPRO What should I tell my care team before I take this medication? They need to know if you have any of these conditions: Heart disease Infection, such as chickenpox, cold sores, herpes, hepatitis B Lung or breathing disease   An unusual or allergic reaction to daratumumab, hyaluronidase, other medications, foods, dyes, or preservatives Pregnant or trying to get pregnant Breast-feeding How should I use this medication? This medication is injected under the skin. It is given by your care team in a hospital or clinic setting. Talk to your care team about the use of this medication in children. Special care  may be needed. Overdosage: If you think you have taken too much of this medicine contact a poison control center or emergency room at once. NOTE: This medicine is only for you. Do not share this medicine with others. What if I miss a dose? Keep appointments for follow-up doses. It is important not to miss your dose. Call your care team if you are unable to keep an appointment. What may interact with this medication? Interactions have not been studied. This list may not describe all possible interactions. Give your health care provider a list of all the medicines, herbs, non-prescription drugs, or dietary supplements you use. Also tell them if you smoke, drink alcohol, or use illegal drugs. Some items may interact with your medicine. What should I watch for while using this medication? Your condition will be monitored carefully while you are receiving this medication. This medication can cause serious allergic reactions. To reduce your risk, your care team may give you other medication to take before receiving this one. Be sure to follow the directions from your care team. This medication can affect the results of blood tests to match your blood type. These changes can last for up to 6 months after the final dose. Your care team will do blood tests to match your blood type before you start treatment. Tell all of your care team that you are being treated with this medication before receiving a blood transfusion. This medication can affect the results of some tests used to determine treatment response; extra tests may be needed to evaluate response. Talk to your care team if you wish to become pregnant or think you are pregnant. This medication can cause serious birth defects if taken during pregnancy and for 3 months after the last dose. A reliable form of contraception is recommended while taking this medication and for 3 months after the last dose. Talk to your care team about effective forms of  contraception. Do not breast-feed while taking this medication. What side effects may I notice from receiving this medication? Side effects that you should report to your care team as soon as possible: Allergic reactions--skin rash, itching, hives, swelling of the face, lips, tongue, or throat Heart rhythm changes--fast or irregular heartbeat, dizziness, feeling faint or lightheaded, chest pain, trouble breathing Infection--fever, chills, cough, sore throat, wounds that don't heal, pain or trouble when passing urine, general feeling of discomfort or being unwell Infusion reactions--chest pain, shortness of breath or trouble breathing, feeling faint or lightheaded Sudden eye pain or change in vision such as blurry vision, seeing halos around lights, vision loss Unusual bruising or bleeding Side effects that usually do not require medical attention (report to your care team if they continue or are bothersome): Constipation Diarrhea Fatigue Nausea Pain, tingling, or numbness in the hands or feet Swelling of the ankles, hands, or feet This list may not describe all possible side effects. Call your doctor for medical advice about side effects. You may report side effects to FDA at 1-800-FDA-1088. Where should I keep my medication? This medication is given in a hospital or clinic. It will not be stored at home. NOTE:   This sheet is a summary. It may not cover all possible information. If you have questions about this medicine, talk to your doctor, pharmacist, or health care provider.  2023 Elsevier/Gold Standard (2022-02-24 00:00:00)  

## 2022-06-18 ENCOUNTER — Ambulatory Visit
Admission: RE | Admit: 2022-06-18 | Discharge: 2022-06-18 | Disposition: A | Discharge status: Home / Self Care | Payer: Medicare Other | Source: Ambulatory Visit | Attending: Radiation Oncology | Admitting: Radiation Oncology

## 2022-06-18 ENCOUNTER — Other Ambulatory Visit: Payer: Self-pay

## 2022-06-18 DIAGNOSIS — C9000 Multiple myeloma not having achieved remission: Secondary | ICD-10-CM | POA: Diagnosis not present

## 2022-06-18 LAB — RAD ONC ARIA SESSION SUMMARY
Course Elapsed Days: 1
Plan Fractions Treated to Date: 2
Plan Prescribed Dose Per Fraction: 2.5 Gy
Plan Total Fractions Prescribed: 14
Plan Total Prescribed Dose: 35 Gy
Reference Point Dosage Given to Date: 5 Gy
Reference Point Session Dosage Given: 2.5 Gy
Session Number: 2

## 2022-06-21 ENCOUNTER — Other Ambulatory Visit: Payer: Self-pay

## 2022-06-21 ENCOUNTER — Ambulatory Visit
Admission: RE | Admit: 2022-06-21 | Discharge: 2022-06-21 | Disposition: A | Discharge status: Home / Self Care | Payer: Medicare Other | Source: Ambulatory Visit | Attending: Radiation Oncology | Admitting: Radiation Oncology

## 2022-06-21 DIAGNOSIS — C9000 Multiple myeloma not having achieved remission: Secondary | ICD-10-CM | POA: Diagnosis not present

## 2022-06-21 LAB — RAD ONC ARIA SESSION SUMMARY
Course Elapsed Days: 4
Plan Fractions Treated to Date: 1
Plan Prescribed Dose Per Fraction: 2.5 Gy
Plan Total Fractions Prescribed: 12
Plan Total Prescribed Dose: 30 Gy
Reference Point Dosage Given to Date: 7.5 Gy
Reference Point Session Dosage Given: 2.5 Gy
Session Number: 3

## 2022-06-22 ENCOUNTER — Ambulatory Visit: Payer: Medicare Other

## 2022-06-22 ENCOUNTER — Encounter (HOSPITAL_COMMUNITY): Payer: Self-pay | Admitting: Hematology & Oncology

## 2022-06-22 ENCOUNTER — Encounter: Payer: Self-pay | Admitting: *Deleted

## 2022-06-22 ENCOUNTER — Other Ambulatory Visit: Payer: Self-pay

## 2022-06-22 ENCOUNTER — Ambulatory Visit
Admission: RE | Admit: 2022-06-22 | Discharge: 2022-06-22 | Disposition: A | Discharge status: Home / Self Care | Payer: Medicare Other | Source: Ambulatory Visit | Attending: Radiation Oncology | Admitting: Radiation Oncology

## 2022-06-22 DIAGNOSIS — C9000 Multiple myeloma not having achieved remission: Secondary | ICD-10-CM

## 2022-06-22 LAB — RAD ONC ARIA SESSION SUMMARY
Course Elapsed Days: 5
Plan Fractions Treated to Date: 2
Plan Prescribed Dose Per Fraction: 2.5 Gy
Plan Total Fractions Prescribed: 12
Plan Total Prescribed Dose: 30 Gy
Reference Point Dosage Given to Date: 10 Gy
Reference Point Session Dosage Given: 2.5 Gy
Session Number: 4

## 2022-06-22 MED ORDER — SONAFINE EX EMUL
1.0000 | Freq: Once | CUTANEOUS | Status: AC
  Administered 2022-06-22: 1 via TOPICAL

## 2022-06-22 NOTE — Progress Notes (Signed)
Pt here for patient teaching.    Pt given Radiation and You booklet, Managing Acute Radiation Side Effects for Head and Neck Cancer handout, skin care instructions, and Sonafine.    Reviewed areas of pertinence such as fatigue, hair loss in treatment field, mouth changes, skin changes, and throat changes .   Pt able to give teach back of to pat skin and use unscented/gentle soap,apply Sonafine bid and avoid applying anything to skin within 4 hours of treatment.   Pt verbalizes understanding of information given and will contact nursing with any questions or concerns.

## 2022-06-22 NOTE — Progress Notes (Signed)
Bone Marrow biopsy reviewed. Confirmed that specimen has been sent for cytogenetics and FISH. Those results are pending.   Oncology Nurse Navigator Documentation     06/22/2022    9:00 AM  Oncology Nurse Navigator Flowsheets  Navigator Follow Up Date: 07/01/2022  Navigator Follow Up Reason: Follow-up Appointment;Chemotherapy  Navigator Location [facility]  Navigator Encounter Type Pathology Review  Patient Visit Type MedOnc  Treatment Phase Active Tx  Barriers/Navigation Needs Coordination of Care;Education  Interventions None Required  Acuity Level 2-Minimal Needs (1-2 Barriers Identified)  Support Groups/Services Friends and Family  Time Spent with Patient 15

## 2022-06-23 ENCOUNTER — Other Ambulatory Visit: Payer: Self-pay

## 2022-06-23 ENCOUNTER — Ambulatory Visit
Admission: RE | Admit: 2022-06-23 | Discharge: 2022-06-23 | Disposition: A | Discharge status: Home / Self Care | Payer: Medicare Other | Source: Ambulatory Visit | Attending: Radiation Oncology | Admitting: Radiation Oncology

## 2022-06-23 DIAGNOSIS — C9000 Multiple myeloma not having achieved remission: Secondary | ICD-10-CM | POA: Diagnosis not present

## 2022-06-23 LAB — RAD ONC ARIA SESSION SUMMARY
Course Elapsed Days: 6
Plan Fractions Treated to Date: 3
Plan Prescribed Dose Per Fraction: 2.5 Gy
Plan Total Fractions Prescribed: 12
Plan Total Prescribed Dose: 30 Gy
Reference Point Dosage Given to Date: 12.5 Gy
Reference Point Session Dosage Given: 2.5 Gy
Session Number: 5

## 2022-06-24 ENCOUNTER — Ambulatory Visit
Admission: RE | Admit: 2022-06-24 | Discharge: 2022-06-24 | Disposition: A | Discharge status: Home / Self Care | Payer: Medicare Other | Source: Ambulatory Visit | Attending: Radiation Oncology | Admitting: Radiation Oncology

## 2022-06-24 ENCOUNTER — Other Ambulatory Visit: Payer: Self-pay

## 2022-06-24 ENCOUNTER — Inpatient Hospital Stay: Payer: Medicare Other

## 2022-06-24 VITALS — BP 130/61 | HR 75 | Temp 98.1°F | Resp 17

## 2022-06-24 DIAGNOSIS — C9000 Multiple myeloma not having achieved remission: Secondary | ICD-10-CM

## 2022-06-24 LAB — CMP (CANCER CENTER ONLY)
ALT: 12 U/L (ref 0–44)
AST: 8 U/L — ABNORMAL LOW (ref 15–41)
Albumin: 4 g/dL (ref 3.5–5.0)
Alkaline Phosphatase: 150 U/L — ABNORMAL HIGH (ref 38–126)
Anion gap: 10 (ref 5–15)
BUN: 13 mg/dL (ref 8–23)
CO2: 24 mmol/L (ref 22–32)
Calcium: 8.8 mg/dL — ABNORMAL LOW (ref 8.9–10.3)
Chloride: 108 mmol/L (ref 98–111)
Creatinine: 1.08 mg/dL (ref 0.61–1.24)
GFR, Estimated: >60 mL/min (ref >60)
Glucose, Bld: 221 mg/dL — ABNORMAL HIGH (ref 70–99)
Potassium: 3.8 mmol/L (ref 3.5–5.1)
Sodium: 142 mmol/L (ref 135–145)
Total Bilirubin: 0.4 mg/dL (ref 0.3–1.2)
Total Protein: 5.9 g/dL — ABNORMAL LOW (ref 6.5–8.1)

## 2022-06-24 LAB — CBC WITH DIFFERENTIAL (CANCER CENTER ONLY)
Abs Immature Granulocytes: 0.02 10*3/uL (ref 0.00–0.07)
Basophils Absolute: 0.1 10*3/uL (ref 0.0–0.1)
Basophils Relative: 2 %
Eosinophils Absolute: 0.2 10*3/uL (ref 0.0–0.5)
Eosinophils Relative: 2 %
HCT: 34.2 % — ABNORMAL LOW (ref 39.0–52.0)
Hemoglobin: 11.2 g/dL — ABNORMAL LOW (ref 13.0–17.0)
Immature Granulocytes: 0 %
Lymphocytes Relative: 13 %
Lymphs Abs: 1.2 10*3/uL (ref 0.7–4.0)
MCH: 29.6 pg (ref 26.0–34.0)
MCHC: 32.7 g/dL (ref 30.0–36.0)
MCV: 90.2 fL (ref 80.0–100.0)
Monocytes Absolute: 0.6 10*3/uL (ref 0.1–1.0)
Monocytes Relative: 7 %
Neutro Abs: 6.6 10*3/uL (ref 1.7–7.7)
Neutrophils Relative %: 76 %
Platelet Count: 268 10*3/uL (ref 150–400)
RBC: 3.79 MIL/uL — ABNORMAL LOW (ref 4.22–5.81)
RDW: 13.9 % (ref 11.5–15.5)
WBC Count: 8.7 10*3/uL (ref 4.0–10.5)
nRBC: 0 % (ref 0.0–0.2)

## 2022-06-24 LAB — RAD ONC ARIA SESSION SUMMARY
Course Elapsed Days: 7
Plan Fractions Treated to Date: 4
Plan Prescribed Dose Per Fraction: 2.5 Gy
Plan Total Fractions Prescribed: 12
Plan Total Prescribed Dose: 30 Gy
Reference Point Dosage Given to Date: 15 Gy
Reference Point Session Dosage Given: 2.5 Gy
Session Number: 6

## 2022-06-24 MED ORDER — DARATUMUMAB-HYALURONIDASE-FIHJ 1800-30000 MG-UT/15ML ~~LOC~~ SOLN
1800.0000 mg | Freq: Once | SUBCUTANEOUS | Status: AC
  Administered 2022-06-24: 1800 mg via SUBCUTANEOUS
  Filled 2022-06-24: qty 15

## 2022-06-24 MED ORDER — DIPHENHYDRAMINE HCL 25 MG PO CAPS
50.0000 mg | ORAL_CAPSULE | Freq: Once | ORAL | Status: AC
  Administered 2022-06-24: 50 mg via ORAL

## 2022-06-24 MED ORDER — BORTEZOMIB CHEMO SQ INJECTION 3.5 MG (2.5MG/ML)
1.3000 mg/m2 | Freq: Once | INTRAMUSCULAR | Status: AC
  Administered 2022-06-24: 2.75 mg via SUBCUTANEOUS
  Filled 2022-06-24: qty 1.1

## 2022-06-24 MED ORDER — ACETAMINOPHEN 325 MG PO TABS
650.0000 mg | ORAL_TABLET | Freq: Once | ORAL | Status: AC
  Administered 2022-06-24: 650 mg via ORAL

## 2022-06-24 NOTE — Progress Notes (Signed)
Ok to treat today with Todays labs per dr Marin Olp

## 2022-06-24 NOTE — Progress Notes (Signed)
Patient refused to wait 1 hour after  the Darzalex injection. Released stable and asymptomatic.

## 2022-06-24 NOTE — Patient Instructions (Signed)
[facility]  Discharge Instructions: Thank you for choosing [facility] to provide your oncology and hematology care.   If you have a lab appointment with the [facility], please go directly to the [facility] and check in at the registration area.  Wear comfortable clothing and clothing appropriate for easy access to any Portacath or PICC line.   We strive to give you quality time with your provider. You may need to reschedule your appointment if you arrive late (15 or more minutes).  Arriving late affects you and other patients whose appointments are after yours.  Also, if you miss three or more appointments without notifying the office, you may be dismissed from the clinic at the provider's discretion.      For prescription refill requests, have your pharmacy contact our office and allow 72 hours for refills to be completed.    Today you received the following chemotherapy and/or immunotherapy agents darzalex velcade     To help prevent nausea and vomiting after your treatment, we encourage you to take your nausea medication as directed.  BELOW ARE SYMPTOMS THAT SHOULD BE REPORTED IMMEDIATELY: *FEVER GREATER THAN 100.4 F (38 C) OR HIGHER *CHILLS OR SWEATING *NAUSEA AND VOMITING THAT IS NOT CONTROLLED WITH YOUR NAUSEA MEDICATION *UNUSUAL SHORTNESS OF BREATH *UNUSUAL BRUISING OR BLEEDING *URINARY PROBLEMS (pain or burning when urinating, or frequent urination) *BOWEL PROBLEMS (unusual diarrhea, constipation, pain near the anus) TENDERNESS IN MOUTH AND THROAT WITH OR WITHOUT PRESENCE OF ULCERS (sore throat, sores in mouth, or a toothache) UNUSUAL RASH, SWELLING OR PAIN  UNUSUAL VAGINAL DISCHARGE OR ITCHING   Items with * indicate a potential emergency and should be followed up as soon as possible or go to the Emergency Department if any problems should occur.  Please show the CHEMOTHERAPY ALERT CARD or IMMUNOTHERAPY ALERT CARD at check-in to  the Emergency Department and triage nurse. Should you have questions after your visit or need to cancel or reschedule your appointment, please contact [facility]  [phone] and follow the prompts.  Office hours are 8:00 a.m. to 4:30 p.m. Monday - Friday. Please note that voicemails left after 4:00 p.m. may not be returned until the following business day.  We are closed weekends and major holidays. You have access to a nurse at all times for urgent questions. Please call the main number to the clinic [phone] and follow the prompts.  For any non-urgent questions, you may also contact your provider using MyChart. We now offer e-Visits for anyone 2 and older to request care online for non-urgent symptoms. For details visit [email or web address].   Also download the MyChart app! Go to the app store, search "MyChart", open the app, select [facility], and log in with your MyChart username and password.  Masks are optional in the cancer centers. If you would like for your care team to wear a mask while they are taking care of you, please let them know. You may have one support person who is at least 66 years old accompany you for your appointments.

## 2022-06-25 ENCOUNTER — Ambulatory Visit
Admission: RE | Admit: 2022-06-25 | Discharge: 2022-06-25 | Disposition: A | Discharge status: Home / Self Care | Payer: Medicare Other | Source: Ambulatory Visit | Attending: Radiation Oncology | Admitting: Radiation Oncology

## 2022-06-25 ENCOUNTER — Other Ambulatory Visit: Payer: Self-pay

## 2022-06-25 DIAGNOSIS — C9000 Multiple myeloma not having achieved remission: Secondary | ICD-10-CM | POA: Diagnosis not present

## 2022-06-25 LAB — RAD ONC ARIA SESSION SUMMARY
Course Elapsed Days: 8
Plan Fractions Treated to Date: 5
Plan Prescribed Dose Per Fraction: 2.5 Gy
Plan Total Fractions Prescribed: 12
Plan Total Prescribed Dose: 30 Gy
Reference Point Dosage Given to Date: 17.5 Gy
Reference Point Session Dosage Given: 2.5 Gy
Session Number: 7

## 2022-06-28 ENCOUNTER — Other Ambulatory Visit: Payer: Self-pay

## 2022-06-28 ENCOUNTER — Ambulatory Visit
Admission: RE | Admit: 2022-06-28 | Discharge: 2022-06-28 | Disposition: A | Discharge status: Home / Self Care | Payer: Medicare Other | Source: Ambulatory Visit | Attending: Radiation Oncology | Admitting: Radiation Oncology

## 2022-06-28 ENCOUNTER — Encounter (HOSPITAL_COMMUNITY): Payer: Self-pay | Admitting: Hematology & Oncology

## 2022-06-28 DIAGNOSIS — C9000 Multiple myeloma not having achieved remission: Secondary | ICD-10-CM | POA: Diagnosis not present

## 2022-06-28 LAB — RAD ONC ARIA SESSION SUMMARY
Course Elapsed Days: 11
Plan Fractions Treated to Date: 6
Plan Prescribed Dose Per Fraction: 2.5 Gy
Plan Total Fractions Prescribed: 12
Plan Total Prescribed Dose: 30 Gy
Reference Point Dosage Given to Date: 20 Gy
Reference Point Session Dosage Given: 2.5 Gy
Session Number: 8

## 2022-06-29 ENCOUNTER — Encounter: Payer: Self-pay | Admitting: Radiation Oncology

## 2022-06-29 ENCOUNTER — Ambulatory Visit: Payer: Medicare Other

## 2022-06-29 ENCOUNTER — Other Ambulatory Visit: Payer: Self-pay

## 2022-06-29 ENCOUNTER — Ambulatory Visit
Admission: RE | Admit: 2022-06-29 | Discharge: 2022-06-29 | Disposition: A | Discharge status: Home / Self Care | Payer: Medicare Other | Source: Ambulatory Visit | Attending: Radiation Oncology | Admitting: Radiation Oncology

## 2022-06-29 ENCOUNTER — Other Ambulatory Visit: Payer: Self-pay | Admitting: Hematology & Oncology

## 2022-06-29 DIAGNOSIS — C9000 Multiple myeloma not having achieved remission: Secondary | ICD-10-CM | POA: Diagnosis not present

## 2022-06-29 LAB — RAD ONC ARIA SESSION SUMMARY
Course Elapsed Days: 12
Plan Fractions Treated to Date: 7
Plan Prescribed Dose Per Fraction: 2.5 Gy
Plan Total Fractions Prescribed: 12
Plan Total Prescribed Dose: 30 Gy
Reference Point Dosage Given to Date: 22.5 Gy
Reference Point Session Dosage Given: 2.5 Gy
Session Number: 9

## 2022-06-30 ENCOUNTER — Other Ambulatory Visit: Payer: Self-pay

## 2022-06-30 ENCOUNTER — Other Ambulatory Visit: Payer: Self-pay | Admitting: Hematology & Oncology

## 2022-06-30 ENCOUNTER — Ambulatory Visit: Payer: Medicare Other

## 2022-06-30 DIAGNOSIS — C9000 Multiple myeloma not having achieved remission: Secondary | ICD-10-CM

## 2022-07-01 ENCOUNTER — Inpatient Hospital Stay (HOSPITAL_BASED_OUTPATIENT_CLINIC_OR_DEPARTMENT_OTHER): Payer: Medicare Other | Admitting: Hematology & Oncology

## 2022-07-01 ENCOUNTER — Ambulatory Visit: Payer: Medicare Other

## 2022-07-01 ENCOUNTER — Telehealth: Payer: Self-pay | Admitting: Oncology

## 2022-07-01 ENCOUNTER — Inpatient Hospital Stay: Payer: Medicare Other

## 2022-07-01 ENCOUNTER — Other Ambulatory Visit: Payer: Self-pay

## 2022-07-01 ENCOUNTER — Encounter: Payer: Self-pay | Admitting: *Deleted

## 2022-07-01 ENCOUNTER — Encounter: Payer: Self-pay | Admitting: Hematology & Oncology

## 2022-07-01 VITALS — BP 133/63 | HR 76 | Temp 97.5°F | Resp 19 | Ht 70.0 in | Wt 187.1 lb

## 2022-07-01 DIAGNOSIS — C9000 Multiple myeloma not having achieved remission: Secondary | ICD-10-CM | POA: Insufficient documentation

## 2022-07-01 DIAGNOSIS — Z5112 Encounter for antineoplastic immunotherapy: Secondary | ICD-10-CM | POA: Insufficient documentation

## 2022-07-01 DIAGNOSIS — Z79899 Other long term (current) drug therapy: Secondary | ICD-10-CM | POA: Insufficient documentation

## 2022-07-01 LAB — CMP (CANCER CENTER ONLY)
ALT: 12 U/L (ref 0–44)
AST: 9 U/L — ABNORMAL LOW (ref 15–41)
Albumin: 4 g/dL (ref 3.5–5.0)
Alkaline Phosphatase: 99 U/L (ref 38–126)
Anion gap: 10 (ref 5–15)
BUN: 12 mg/dL (ref 8–23)
CO2: 24 mmol/L (ref 22–32)
Calcium: 8.7 mg/dL — ABNORMAL LOW (ref 8.9–10.3)
Chloride: 107 mmol/L (ref 98–111)
Creatinine: 1.07 mg/dL (ref 0.61–1.24)
GFR, Estimated: >60 mL/min (ref >60)
Glucose, Bld: 276 mg/dL — ABNORMAL HIGH (ref 70–99)
Potassium: 4 mmol/L (ref 3.5–5.1)
Sodium: 141 mmol/L (ref 135–145)
Total Bilirubin: 0.7 mg/dL (ref 0.3–1.2)
Total Protein: 6.4 g/dL — ABNORMAL LOW (ref 6.5–8.1)

## 2022-07-01 LAB — CBC WITH DIFFERENTIAL (CANCER CENTER ONLY)
Abs Immature Granulocytes: 0.02 10*3/uL (ref 0.00–0.07)
Basophils Absolute: 0.1 10*3/uL (ref 0.0–0.1)
Basophils Relative: 1 %
Eosinophils Absolute: 0.2 10*3/uL (ref 0.0–0.5)
Eosinophils Relative: 2 %
HCT: 35.3 % — ABNORMAL LOW (ref 39.0–52.0)
Hemoglobin: 11.3 g/dL — ABNORMAL LOW (ref 13.0–17.0)
Immature Granulocytes: 0 %
Lymphocytes Relative: 11 %
Lymphs Abs: 1 10*3/uL (ref 0.7–4.0)
MCH: 29.1 pg (ref 26.0–34.0)
MCHC: 32 g/dL (ref 30.0–36.0)
MCV: 91 fL (ref 80.0–100.0)
Monocytes Absolute: 0.6 10*3/uL (ref 0.1–1.0)
Monocytes Relative: 7 %
Neutro Abs: 6.8 10*3/uL (ref 1.7–7.7)
Neutrophils Relative %: 79 %
Platelet Count: 190 10*3/uL (ref 150–400)
RBC: 3.88 MIL/uL — ABNORMAL LOW (ref 4.22–5.81)
RDW: 14.1 % (ref 11.5–15.5)
WBC Count: 8.6 10*3/uL (ref 4.0–10.5)
nRBC: 0 % (ref 0.0–0.2)

## 2022-07-01 LAB — LACTATE DEHYDROGENASE: LDH: 156 U/L (ref 98–192)

## 2022-07-01 MED ORDER — BORTEZOMIB CHEMO SQ INJECTION 3.5 MG (2.5MG/ML)
1.3000 mg/m2 | Freq: Once | INTRAMUSCULAR | Status: AC
  Administered 2022-07-01: 2.75 mg via SUBCUTANEOUS
  Filled 2022-07-01: qty 1.1

## 2022-07-01 MED ORDER — DARATUMUMAB-HYALURONIDASE-FIHJ 1800-30000 MG-UT/15ML ~~LOC~~ SOLN
1800.0000 mg | Freq: Once | SUBCUTANEOUS | Status: AC
  Administered 2022-07-01: 1800 mg via SUBCUTANEOUS
  Filled 2022-07-01: qty 15

## 2022-07-01 MED ORDER — DIPHENHYDRAMINE HCL 25 MG PO CAPS
50.0000 mg | ORAL_CAPSULE | Freq: Once | ORAL | Status: AC
  Administered 2022-07-01: 50 mg via ORAL
  Filled 2022-07-01: qty 2

## 2022-07-01 MED ORDER — ACETAMINOPHEN 325 MG PO TABS
650.0000 mg | ORAL_TABLET | Freq: Once | ORAL | Status: AC
  Administered 2022-07-01: 650 mg via ORAL
  Filled 2022-07-01: qty 2

## 2022-07-01 NOTE — Progress Notes (Signed)
Hematology and Oncology Follow Up Visit  ENCARNACION SCIONEAUX [id number] 10/22/1956 66 y.o. 07/01/2022   Principle Diagnosis:  IgA Kappa myeloma  Current Therapy:   S/p cervical spine stabilization -- 04/28/2022 Faspro/Velcade/Revlimid/Decadron -- s/p cycle #1 -- start on 05/20/2022 Zometa 4 mg IV q 3 months -- next dose on 07/2022     Interim History:  Mr. Mauss is back for follow-up.  Every time I see him, he seems to be getting a little bit better.  He is no longer using a wheelchair.  He just has a cane.  His neck is doing much better.  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  He then had radiation therapy.  He does complain of some odynophagia and some dysphagia from the radiation.  He did have a bone marrow biopsy done.  This was done on 06/15/2022.  Surprise enough, the bone marrow biopsy ([id number]) showed a hypercellular marrow.  There was only 1% plasma cells.  He clearly has myeloma based on his biopsies that were done with his surgery and with his 24-hour urine.  The FISH STUDIES and cytogenetics were all normal.   I have to believe that he is still responding.  We will have to see what his myeloma studies look like.  His blood sugars are quite high.  He really needs to get these under better control.  He has had no bleeding.  There is no problems with bowels or bladder.  He does have a little bit of leg swelling which is chronic.  There is been no issues with fever.  He has had no rashes.  He has had no headache.  Overall, his performance status is ECOG 1.    Medications:  Current Outpatient Medications:    aspirin 325 MG tablet, Take 325 mg by mouth daily., Disp: , Rfl:    diltiazem 2 % GEL, Apply 1 Application topically 2 (two) times daily., Disp: 1 g, Rfl: 5   famciclovir (FAMVIR) 250 MG tablet, Take 1 tablet (250 mg total) by mouth daily., Disp: 30 tablet, Rfl: 12   lactulose (CHRONULAC) 10 GM/15ML solution, TAKE 20 MLS BY MOUTH  EVERY 8 HOURS AS NEEDED, Disp: 473 mL, Rfl: 1   metFORMIN (GLUCOPHAGE) 500 MG tablet, Take 500 mg by mouth 2 (two) times daily with a meal., Disp: , Rfl:    montelukast (SINGULAIR) 10 MG tablet, Take 1 tablet (10 mg total) by mouth at bedtime. Start Singulair 2 days before you start the chemotherapy in July., Disp: 7 tablet, Rfl: 0   polyethylene glycol (MIRALAX / GLYCOLAX) 17 g packet, Take 17 g by mouth daily as needed for moderate constipation., Disp: , Rfl:    tamsulosin (FLOMAX) 0.4 MG CAPS capsule, Take 0.4 mg by mouth daily., Disp: , Rfl:    lenalidomide (REVLIMID) 25 MG capsule, Take one capsule by mouth for 21 days ON, then OFF for seven days. Fanny Dance #[id number]  Date Obtained 05/31/22 (Patient not taking: Reported on 06/02/2022), Disp: 21 capsule, Rfl: 0   lisinopril (ZESTRIL) 10 MG tablet, Take 10 mg by mouth daily as needed (blood pressure of 160/85). (Patient not taking: Reported on 06/02/2022), Disp: , Rfl:    LORazepam (ATIVAN) 0.5 MG tablet, Take 1 tablet (0.5 mg total) by mouth every 6 (six) hours as needed (Nausea or vomiting). (Patient not taking: Reported on 05/24/2022), Disp: 30 tablet, Rfl: 0   methocarbamol (ROBAXIN) 500 MG tablet, Take 1 tablet (500 mg total) by mouth  every 6 (six) hours as needed for muscle spasms. (Patient not taking: Reported on 05/24/2022), Disp: 30 tablet, Rfl: 2   ondansetron (ZOFRAN) 8 MG tablet, Take 1 tablet (8 mg total) by mouth 2 (two) times daily as needed (Nausea or vomiting). (Patient not taking: Reported on 05/24/2022), Disp: 30 tablet, Rfl: 1   oxyCODONE-acetaminophen (PERCOCET) 10-325 MG tablet, Take 1 tablet by mouth every 4 (four) hours as needed for pain. (Patient not taking: Reported on 05/24/2022), Disp: , Rfl:    oxyCODONE-acetaminophen (PERCOCET/ROXICET) 5-325 MG tablet, Take 1-2 tablets by mouth every 4 (four) hours as needed for severe pain. (Patient not taking: Reported on 05/24/2022), Disp: 30 tablet, Rfl: 0   prochlorperazine (COMPAZINE)  10 MG tablet, Take 1 tablet (10 mg total) by mouth every 6 (six) hours as needed (Nausea or vomiting). (Patient not taking: Reported on 05/24/2022), Disp: 30 tablet, Rfl: 1  Allergies: No Known Allergies  Past Medical History, Surgical history, Social history, and Family History were reviewed and updated.  Review of Systems: Review of Systems  Constitutional: Negative.   HENT:  Negative.    Eyes: Negative.   Respiratory: Negative.    Cardiovascular: Negative.   Gastrointestinal: Negative.   Endocrine: Negative.   Genitourinary:  Positive for difficulty urinating. Negative for bladder incontinence.   Musculoskeletal:  Positive for neck pain.  Skin: Negative.   Neurological: Negative.   Hematological: Negative.   Psychiatric/Behavioral: Negative.      Physical Exam:  height is 5' 10"  (1.778 m) and weight is 187 lb 0.8 oz (84.8 kg). His oral temperature is 97.5 F (36.4 C) (abnormal). His blood pressure is 133/63 and his pulse is 76. His respiration is 19 and oxygen saturation is 100%.   Wt Readings from Last 3 Encounters:  07/01/22 187 lb 0.8 oz (84.8 kg)  06/17/22 190 lb (86.2 kg)  06/15/22 180 lb 12.4 oz (82 kg)    Physical Exam Vitals reviewed.  HENT:     Head: Normocephalic and atraumatic.  Eyes:     Pupils: Pupils are equal, round, and reactive to light.  Neck:     Comments: His neck exam does show a cervical collar.  I did not take this off. Cardiovascular:     Rate and Rhythm: Normal rate and regular rhythm.     Heart sounds: Normal heart sounds.  Pulmonary:     Effort: Pulmonary effort is normal.     Breath sounds: Normal breath sounds.  Abdominal:     General: Bowel sounds are normal.     Palpations: Abdomen is soft.  Genitourinary:    Comments: Currently, he has an indwelling Foley catheter in. Musculoskeletal:        General: No tenderness or deformity. Normal range of motion.     Cervical back: Normal range of motion.  Lymphadenopathy:     Cervical: No  cervical adenopathy.  Skin:    General: Skin is warm and dry.     Findings: No erythema or rash.  Neurological:     Mental Status: He is alert and oriented to person, place, and time.  Psychiatric:        Behavior: Behavior normal.        Thought Content: Thought content normal.        Judgment: Judgment normal.     Lab Results  Component Value Date   WBC 8.6 07/01/2022   HGB 11.3 (L) 07/01/2022   HCT 35.3 (L) 07/01/2022   MCV 91.0 07/01/2022   PLT  190 07/01/2022     Chemistry      Component Value Date/Time   NA 141 07/01/2022 0814   K 4.0 07/01/2022 0814   CL 107 07/01/2022 0814   CO2 24 07/01/2022 0814   BUN 12 07/01/2022 0814   CREATININE 1.07 07/01/2022 0814      Component Value Date/Time   CALCIUM 8.7 (L) 07/01/2022 0814   ALKPHOS 99 07/01/2022 0814   AST 9 (L) 07/01/2022 0814   ALT 12 07/01/2022 0814   BILITOT 0.7 07/01/2022 0814      Impression and Plan: Mr. Hillier is a very nice 66 year old white male.  He has IgA kappa myeloma.  Most of his myeloma is Kappa light chain.  This I think will be the indicator for response.  It will be interesting to see how his levels look now.  Again, his blood sugars are on the high side.  We stopped the Decadron that he is taking.  Hopefully, his family doctor can help with the hyperglycemia.  We will go ahead with his treatment today.  This is being day 15 of cycle 2.  We will go ahead plan to start cycle 3 of treatment on 31 August.  He is doing well with the daratumumab.    Volanda Napoleon, MD 8/17/20239:39 AM

## 2022-07-01 NOTE — Progress Notes (Signed)
Patient feeling mostly better, but due to side effects related to radiation he states that he is done treatment. He will forego his last scheduled treatment.   Oncology Nurse Navigator Documentation     07/01/2022    8:30 AM  Oncology Nurse Navigator Flowsheets  Phase of Treatment Radiation  Radiation Actual Start Date: 06/17/2022  Radiation Actual End Date: 06/29/2022  Navigator Follow Up Date: 07/15/2022  Navigator Follow Up Reason: Follow-up Appointment;Chemotherapy  Navigator Location [facility]  Navigator Encounter Type Treatment;Appt/Treatment Plan Review  Patient Visit Type MedOnc  Treatment Phase Active Tx  Barriers/Navigation Needs Coordination of Care;Education  Interventions Psycho-Social Support  Acuity Level 2-Minimal Needs (1-2 Barriers Identified)  Support Groups/Services Friends and Family  Time Spent with Patient 15

## 2022-07-01 NOTE — Telephone Encounter (Signed)
Called [facility] regarding Dean's last radiation treatment today.  She said he is currently getting chemo so he will not be able to have radiation today. She also said he does not want the last treatment because of his throat pain.  Notified Dr. Sondra Come and [facility].

## 2022-07-01 NOTE — Patient Instructions (Signed)
[facility]  Discharge Instructions: Thank you for choosing [facility] to provide your oncology and hematology care.   If you have a lab appointment with the [facility], please go directly to the [facility] and check in at the registration area.  Wear comfortable clothing and clothing appropriate for easy access to any Portacath or PICC line.   We strive to give you quality time with your provider. You may need to reschedule your appointment if you arrive late (15 or more minutes).  Arriving late affects you and other patients whose appointments are after yours.  Also, if you miss three or more appointments without notifying the office, you may be dismissed from the clinic at the provider's discretion.      For prescription refill requests, have your pharmacy contact our office and allow 72 hours for refills to be completed.    Today you received the following chemotherapy and/or immunotherapy agents velcade, faspro   To help prevent nausea and vomiting after your treatment, we encourage you to take your nausea medication as directed.  BELOW ARE SYMPTOMS THAT SHOULD BE REPORTED IMMEDIATELY: *FEVER GREATER THAN 100.4 F (38 C) OR HIGHER *CHILLS OR SWEATING *NAUSEA AND VOMITING THAT IS NOT CONTROLLED WITH YOUR NAUSEA MEDICATION *UNUSUAL SHORTNESS OF BREATH *UNUSUAL BRUISING OR BLEEDING *URINARY PROBLEMS (pain or burning when urinating, or frequent urination) *BOWEL PROBLEMS (unusual diarrhea, constipation, pain near the anus) TENDERNESS IN MOUTH AND THROAT WITH OR WITHOUT PRESENCE OF ULCERS (sore throat, sores in mouth, or a toothache) UNUSUAL RASH, SWELLING OR PAIN  UNUSUAL VAGINAL DISCHARGE OR ITCHING   Items with * indicate a potential emergency and should be followed up as soon as possible or go to the Emergency Department if any problems should occur.  Please show the CHEMOTHERAPY ALERT CARD or IMMUNOTHERAPY ALERT CARD at check-in to  the Emergency Department and triage nurse. Should you have questions after your visit or need to cancel or reschedule your appointment, please contact [facility]  [phone] and follow the prompts.  Office hours are 8:00 a.m. to 4:30 p.m. Monday - Friday. Please note that voicemails left after 4:00 p.m. may not be returned until the following business day.  We are closed weekends and major holidays. You have access to a nurse at all times for urgent questions. Please call the main number to the clinic [phone] and follow the prompts.  For any non-urgent questions, you may also contact your provider using MyChart. We now offer e-Visits for anyone 22 and older to request care online for non-urgent symptoms. For details visit [email or web address].   Also download the MyChart app! Go to the app store, search "MyChart", open the app, select [facility], and log in with your MyChart username and password.  Masks are optional in the cancer centers. If you would like for your care team to wear a mask while they are taking care of you, please let them know. You may have one support person who is at least 66 years old accompany you for your appointments.

## 2022-07-02 ENCOUNTER — Ambulatory Visit: Payer: Medicare Other

## 2022-07-02 ENCOUNTER — Encounter: Payer: Self-pay | Admitting: Hematology & Oncology

## 2022-07-05 ENCOUNTER — Ambulatory Visit: Payer: Medicare Other

## 2022-07-06 ENCOUNTER — Ambulatory Visit: Payer: Medicare Other

## 2022-07-07 ENCOUNTER — Other Ambulatory Visit: Payer: Self-pay | Admitting: *Deleted

## 2022-07-07 DIAGNOSIS — C9000 Multiple myeloma not having achieved remission: Secondary | ICD-10-CM

## 2022-07-07 DIAGNOSIS — G959 Disease of spinal cord, unspecified: Secondary | ICD-10-CM

## 2022-07-08 ENCOUNTER — Inpatient Hospital Stay: Payer: Medicare Other

## 2022-07-08 ENCOUNTER — Other Ambulatory Visit: Payer: Self-pay | Admitting: *Deleted

## 2022-07-08 VITALS — BP 147/89 | HR 99 | Temp 97.8°F | Resp 17

## 2022-07-08 DIAGNOSIS — G959 Disease of spinal cord, unspecified: Secondary | ICD-10-CM

## 2022-07-08 DIAGNOSIS — C9000 Multiple myeloma not having achieved remission: Secondary | ICD-10-CM

## 2022-07-08 DIAGNOSIS — Z5112 Encounter for antineoplastic immunotherapy: Secondary | ICD-10-CM | POA: Diagnosis not present

## 2022-07-08 LAB — CBC WITH DIFFERENTIAL (CANCER CENTER ONLY)
Abs Immature Granulocytes: 0.04 10*3/uL (ref 0.00–0.07)
Basophils Absolute: 0 10*3/uL (ref 0.0–0.1)
Basophils Relative: 1 %
Eosinophils Absolute: 0.5 10*3/uL (ref 0.0–0.5)
Eosinophils Relative: 6 %
HCT: 36.4 % — ABNORMAL LOW (ref 39.0–52.0)
Hemoglobin: 11.8 g/dL — ABNORMAL LOW (ref 13.0–17.0)
Immature Granulocytes: 1 %
Lymphocytes Relative: 13 %
Lymphs Abs: 0.9 10*3/uL (ref 0.7–4.0)
MCH: 29.4 pg (ref 26.0–34.0)
MCHC: 32.4 g/dL (ref 30.0–36.0)
MCV: 90.5 fL (ref 80.0–100.0)
Monocytes Absolute: 0.4 10*3/uL (ref 0.1–1.0)
Monocytes Relative: 6 %
Neutro Abs: 5.4 10*3/uL (ref 1.7–7.7)
Neutrophils Relative %: 73 %
Platelet Count: 188 10*3/uL (ref 150–400)
RBC: 4.02 MIL/uL — ABNORMAL LOW (ref 4.22–5.81)
RDW: 14.3 % (ref 11.5–15.5)
WBC Count: 7.3 10*3/uL (ref 4.0–10.5)
nRBC: 0 % (ref 0.0–0.2)

## 2022-07-08 LAB — CMP (CANCER CENTER ONLY)
ALT: 13 U/L (ref 0–44)
AST: 9 U/L — ABNORMAL LOW (ref 15–41)
Albumin: 4.1 g/dL (ref 3.5–5.0)
Alkaline Phosphatase: 81 U/L (ref 38–126)
Anion gap: 10 (ref 5–15)
BUN: 12 mg/dL (ref 8–23)
CO2: 26 mmol/L (ref 22–32)
Calcium: 9.2 mg/dL (ref 8.9–10.3)
Chloride: 106 mmol/L (ref 98–111)
Creatinine: 1.13 mg/dL (ref 0.61–1.24)
GFR, Estimated: >60 mL/min (ref >60)
Glucose, Bld: 137 mg/dL — ABNORMAL HIGH (ref 70–99)
Potassium: 4.3 mmol/L (ref 3.5–5.1)
Sodium: 142 mmol/L (ref 135–145)
Total Bilirubin: 0.6 mg/dL (ref 0.3–1.2)
Total Protein: 6.5 g/dL (ref 6.5–8.1)

## 2022-07-08 MED ORDER — ACETAMINOPHEN 325 MG PO TABS
650.0000 mg | ORAL_TABLET | Freq: Once | ORAL | Status: AC
  Administered 2022-07-08: 650 mg via ORAL
  Filled 2022-07-08: qty 2

## 2022-07-08 MED ORDER — LENALIDOMIDE 25 MG PO CAPS: ORAL_CAPSULE | ORAL | 0 refills | Status: DC

## 2022-07-08 MED ORDER — DIPHENHYDRAMINE HCL 25 MG PO CAPS
50.0000 mg | ORAL_CAPSULE | Freq: Once | ORAL | Status: AC
  Administered 2022-07-08: 50 mg via ORAL
  Filled 2022-07-08: qty 2

## 2022-07-08 MED ORDER — DARATUMUMAB-HYALURONIDASE-FIHJ 1800-30000 MG-UT/15ML ~~LOC~~ SOLN
1800.0000 mg | Freq: Once | SUBCUTANEOUS | Status: AC
  Administered 2022-07-08: 1800 mg via SUBCUTANEOUS
  Filled 2022-07-08: qty 15

## 2022-07-08 NOTE — Patient Instructions (Signed)
Daratumumab; Hyaluronidase Injection What is this medication? DARATUMUMAB; HYALURONIDASE (dar a toom ue mab; hye al ur ON i dase) treats multiple myeloma, a type of bone marrow cancer. Daratumumab works by blocking a protein that causes cancer cells to grow and multiply. This helps to slow or stop the spread of cancer cells. Hyaluronidase works by increasing the absorption of other medications in the body to help them work better. This medication may also be used treat amyloidosis, a condition that causes the buildup of a protein (amyloid) in your body. It works by reducing the buildup of this protein, which decreases symptoms. It is a combination medication that contains a monoclonal antibody. This medicine may be used for other purposes; ask your health care provider or pharmacist if you have questions. COMMON BRAND NAME(S): DARZALEX FASPRO What should I tell my care team before I take this medication? They need to know if you have any of these conditions: Heart disease Infection, such as chickenpox, cold sores, herpes, hepatitis B Lung or breathing disease An unusual or allergic reaction to daratumumab, hyaluronidase, other medications, foods, dyes, or preservatives Pregnant or trying to get pregnant Breast-feeding How should I use this medication? This medication is injected under the skin. It is given by your care team in a hospital or clinic setting. Talk to your care team about the use of this medication in children. Special care may be needed. Overdosage: If you think you have taken too much of this medicine contact a poison control center or emergency room at once. NOTE: This medicine is only for you. Do not share this medicine with others. What if I miss a dose? Keep appointments for follow-up doses. It is important not to miss your dose. Call your care team if you are unable to keep an appointment. What may interact with this medication? Interactions have not been studied. This list  may not describe all possible interactions. Give your health care provider a list of all the medicines, herbs, non-prescription drugs, or dietary supplements you use. Also tell them if you smoke, drink alcohol, or use illegal drugs. Some items may interact with your medicine. What should I watch for while using this medication? Your condition will be monitored carefully while you are receiving this medication. This medication can cause serious allergic reactions. To reduce your risk, your care team may give you other medication to take before receiving this one. Be sure to follow the directions from your care team. This medication can affect the results of blood tests to match your blood type. These changes can last for up to 6 months after the final dose. Your care team will do blood tests to match your blood type before you start treatment. Tell all of your care team that you are being treated with this medication before receiving a blood transfusion. This medication can affect the results of some tests used to determine treatment response; extra tests may be needed to evaluate response. Talk to your care team if you wish to become pregnant or think you are pregnant. This medication can cause serious birth defects if taken during pregnancy and for 3 months after the last dose. A reliable form of contraception is recommended while taking this medication and for 3 months after the last dose. Talk to your care team about effective forms of contraception. Do not breast-feed while taking this medication. What side effects may I notice from receiving this medication? Side effects that you should report to your care team as soon as   possible: Allergic reactions--skin rash, itching, hives, swelling of the face, lips, tongue, or throat Heart rhythm changes--fast or irregular heartbeat, dizziness, feeling faint or lightheaded, chest pain, trouble breathing Infection--fever, chills, cough, sore throat, wounds that  don't heal, pain or trouble when passing urine, general feeling of discomfort or being unwell Infusion reactions--chest pain, shortness of breath or trouble breathing, feeling faint or lightheaded Sudden eye pain or change in vision such as blurry vision, seeing halos around lights, vision loss Unusual bruising or bleeding Side effects that usually do not require medical attention (report to your care team if they continue or are bothersome): Constipation Diarrhea Fatigue Nausea Pain, tingling, or numbness in the hands or feet Swelling of the ankles, hands, or feet This list may not describe all possible side effects. Call your doctor for medical advice about side effects. You may report side effects to FDA at 1-800-FDA-1088. Where should I keep my medication? This medication is given in a hospital or clinic. It will not be stored at home. NOTE: This sheet is a summary. It may not cover all possible information. If you have questions about this medicine, talk to your doctor, pharmacist, or health care provider.  2023 Elsevier/Gold Standard (2022-02-24 00:00:00)  

## 2022-07-13 LAB — SURGICAL PATHOLOGY

## 2022-07-15 ENCOUNTER — Inpatient Hospital Stay (HOSPITAL_BASED_OUTPATIENT_CLINIC_OR_DEPARTMENT_OTHER): Payer: Medicare Other | Admitting: Hematology & Oncology

## 2022-07-15 ENCOUNTER — Encounter: Payer: Self-pay | Admitting: Hematology & Oncology

## 2022-07-15 ENCOUNTER — Encounter: Payer: Self-pay | Admitting: *Deleted

## 2022-07-15 ENCOUNTER — Other Ambulatory Visit (HOSPITAL_COMMUNITY): Payer: Self-pay

## 2022-07-15 ENCOUNTER — Inpatient Hospital Stay: Payer: Medicare Other

## 2022-07-15 VITALS — BP 139/65 | HR 76 | Temp 98.2°F | Resp 20 | Ht 70.0 in | Wt 187.4 lb

## 2022-07-15 VITALS — BP 122/69 | HR 72 | Resp 17

## 2022-07-15 DIAGNOSIS — C9000 Multiple myeloma not having achieved remission: Secondary | ICD-10-CM

## 2022-07-15 DIAGNOSIS — Z5112 Encounter for antineoplastic immunotherapy: Secondary | ICD-10-CM | POA: Diagnosis not present

## 2022-07-15 LAB — CBC WITH DIFFERENTIAL (CANCER CENTER ONLY)
Abs Immature Granulocytes: 0.08 10*3/uL — ABNORMAL HIGH (ref 0.00–0.07)
Basophils Absolute: 0 10*3/uL (ref 0.0–0.1)
Basophils Relative: 0 %
Eosinophils Absolute: 0.1 10*3/uL (ref 0.0–0.5)
Eosinophils Relative: 3 %
HCT: 37.4 % — ABNORMAL LOW (ref 39.0–52.0)
Hemoglobin: 12.1 g/dL — ABNORMAL LOW (ref 13.0–17.0)
Immature Granulocytes: 2 %
Lymphocytes Relative: 14 %
Lymphs Abs: 0.6 10*3/uL — ABNORMAL LOW (ref 0.7–4.0)
MCH: 29.3 pg (ref 26.0–34.0)
MCHC: 32.4 g/dL (ref 30.0–36.0)
MCV: 90.6 fL (ref 80.0–100.0)
Monocytes Absolute: 0.4 10*3/uL (ref 0.1–1.0)
Monocytes Relative: 9 %
Neutro Abs: 3.1 10*3/uL (ref 1.7–7.7)
Neutrophils Relative %: 72 %
Platelet Count: 223 10*3/uL (ref 150–400)
RBC: 4.13 MIL/uL — ABNORMAL LOW (ref 4.22–5.81)
RDW: 14.1 % (ref 11.5–15.5)
WBC Count: 4.3 10*3/uL (ref 4.0–10.5)
nRBC: 0 % (ref 0.0–0.2)

## 2022-07-15 LAB — CMP (CANCER CENTER ONLY)
ALT: 11 U/L (ref 0–44)
AST: 10 U/L — ABNORMAL LOW (ref 15–41)
Albumin: 4.1 g/dL (ref 3.5–5.0)
Alkaline Phosphatase: 63 U/L (ref 38–126)
Anion gap: 10 (ref 5–15)
BUN: 12 mg/dL (ref 8–23)
CO2: 24 mmol/L (ref 22–32)
Calcium: 9 mg/dL (ref 8.9–10.3)
Chloride: 106 mmol/L (ref 98–111)
Creatinine: 1.1 mg/dL (ref 0.61–1.24)
GFR, Estimated: >60 mL/min (ref >60)
Glucose, Bld: 137 mg/dL — ABNORMAL HIGH (ref 70–99)
Potassium: 4.4 mmol/L (ref 3.5–5.1)
Sodium: 140 mmol/L (ref 135–145)
Total Bilirubin: 0.5 mg/dL (ref 0.3–1.2)
Total Protein: 6.4 g/dL — ABNORMAL LOW (ref 6.5–8.1)

## 2022-07-15 LAB — LACTATE DEHYDROGENASE: LDH: 130 U/L (ref 98–192)

## 2022-07-15 MED ORDER — DARATUMUMAB-HYALURONIDASE-FIHJ 1800-30000 MG-UT/15ML ~~LOC~~ SOLN
1800.0000 mg | Freq: Once | SUBCUTANEOUS | Status: AC
  Administered 2022-07-15: 1800 mg via SUBCUTANEOUS
  Filled 2022-07-15: qty 15

## 2022-07-15 MED ORDER — ACETAMINOPHEN 325 MG PO TABS
650.0000 mg | ORAL_TABLET | Freq: Once | ORAL | Status: AC
  Administered 2022-07-15: 650 mg via ORAL
  Filled 2022-07-15: qty 2

## 2022-07-15 MED ORDER — BORTEZOMIB CHEMO SQ INJECTION 3.5 MG (2.5MG/ML)
1.3000 mg/m2 | Freq: Once | INTRAMUSCULAR | Status: AC
  Administered 2022-07-15: 2.75 mg via SUBCUTANEOUS
  Filled 2022-07-15: qty 1.1

## 2022-07-15 MED ORDER — DIPHENHYDRAMINE HCL 25 MG PO CAPS
50.0000 mg | ORAL_CAPSULE | Freq: Once | ORAL | Status: AC
  Administered 2022-07-15: 50 mg via ORAL
  Filled 2022-07-15: qty 2

## 2022-07-15 NOTE — Progress Notes (Signed)
Hematology and Oncology Follow Up Visit  Barry Gray [id number] 09/26/1956 66 y.o. 07/15/2022   Principle Diagnosis:  IgA Kappa myeloma  Current Therapy:   S/p cervical spine stabilization -- 04/28/2022 Faspro/Velcade/Revlimid/Decadron -- s/p cycle #2 -- start on 05/20/2022 Zometa 4 mg IV q 3 months -- next dose on 07/2022     Interim History:  Barry Gray is back for follow-up.  He really does look fantastic.  He has responded incredibly well to treatment.  He did well have his surgery for his neck.  He is not using a cane anymore.  So far, we will have to see how his myeloma studies have improved.  Hopefully, we will see a very nice drop in his myeloma numbers.  He does not have any problems with pain.  It does sound like he has an issue with coming down off the Decadron.  He says after 2 days, his energy level goes down quite a bit.  I believe this is probably from the Decadron.  I told him that 2 days after he takes the 20 mg Decadron dose, he can take 12 mg and then 8 mg the next day and then 4 mg the following day.  This may help lessen the fatigue that he has.  His appetite is quite good.  He has had no nausea or vomiting.  He has had no change in bowel or bladder habits.  He has had no leg swelling.  He has had no rashes.  Overall, I would say his performance status is probably ECOG 1.     Medications:  Current Outpatient Medications:    aspirin 325 MG tablet, Take 325 mg by mouth daily., Disp: , Rfl:    famciclovir (FAMVIR) 250 MG tablet, Take 1 tablet (250 mg total) by mouth daily., Disp: 30 tablet, Rfl: 12   glipiZIDE (GLUCOTROL) 5 MG tablet, Take by mouth 2 (two) times daily before a meal., Disp: , Rfl:    lactulose (CHRONULAC) 10 GM/15ML solution, TAKE 20 MLS BY MOUTH EVERY 8 HOURS AS NEEDED, Disp: 473 mL, Rfl: 1   lenalidomide (REVLIMID) 25 MG capsule, Take one capsule by mouth for 21 days ON, then OFF for seven days. Barry Gray #[id number], Disp: 21 capsule, Rfl:  0   metFORMIN (GLUCOPHAGE) 500 MG tablet, Take 500 mg by mouth 2 (two) times daily with a meal., Disp: , Rfl:    tamsulosin (FLOMAX) 0.4 MG CAPS capsule, Take 0.4 mg by mouth daily., Disp: , Rfl:    diltiazem 2 % GEL, Apply 1 Application topically 2 (two) times daily. (Patient not taking: Reported on 07/15/2022), Disp: 1 g, Rfl: 5   lisinopril (ZESTRIL) 10 MG tablet, Take 10 mg by mouth daily as needed (blood pressure of 160/85). (Patient not taking: Reported on 06/02/2022), Disp: , Rfl:    LORazepam (ATIVAN) 0.5 MG tablet, Take 1 tablet (0.5 mg total) by mouth every 6 (six) hours as needed (Nausea or vomiting). (Patient not taking: Reported on 05/24/2022), Disp: 30 tablet, Rfl: 0   methocarbamol (ROBAXIN) 500 MG tablet, Take 1 tablet (500 mg total) by mouth every 6 (six) hours as needed for muscle spasms. (Patient not taking: Reported on 05/24/2022), Disp: 30 tablet, Rfl: 2   montelukast (SINGULAIR) 10 MG tablet, Take 1 tablet (10 mg total) by mouth at bedtime. Start Singulair 2 days before you start the chemotherapy in July. (Patient not taking: Reported on 07/15/2022), Disp: 7 tablet, Rfl: 0   ondansetron (ZOFRAN) 8 MG tablet, Take  1 tablet (8 mg total) by mouth 2 (two) times daily as needed (Nausea or vomiting). (Patient not taking: Reported on 05/24/2022), Disp: 30 tablet, Rfl: 1   oxyCODONE-acetaminophen (PERCOCET) 10-325 MG tablet, Take 1 tablet by mouth every 4 (four) hours as needed for pain. (Patient not taking: Reported on 07/15/2022), Disp: , Rfl:    oxyCODONE-acetaminophen (PERCOCET/ROXICET) 5-325 MG tablet, Take 1-2 tablets by mouth every 4 (four) hours as needed for severe pain. (Patient not taking: Reported on 05/24/2022), Disp: 30 tablet, Rfl: 0   polyethylene glycol (MIRALAX / GLYCOLAX) 17 g packet, Take 17 g by mouth daily as needed for moderate constipation. (Patient not taking: Reported on 07/15/2022), Disp: , Rfl:    prochlorperazine (COMPAZINE) 10 MG tablet, Take 1 tablet (10 mg total) by  mouth every 6 (six) hours as needed (Nausea or vomiting). (Patient not taking: Reported on 05/24/2022), Disp: 30 tablet, Rfl: 1  Allergies: No Known Allergies  Past Medical History, Surgical history, Social history, and Family History were reviewed and updated.  Review of Systems: Review of Systems  Constitutional: Negative.   HENT:  Negative.    Eyes: Negative.   Respiratory: Negative.    Cardiovascular: Negative.   Gastrointestinal: Negative.   Endocrine: Negative.   Genitourinary:  Positive for difficulty urinating. Negative for bladder incontinence.   Musculoskeletal:  Positive for neck pain.  Skin: Negative.   Neurological: Negative.   Hematological: Negative.   Psychiatric/Behavioral: Negative.      Physical Exam:  height is '5\' 10"'$  (1.778 m) and weight is 187 lb 6.4 oz (85 kg). His oral temperature is 98.2 F (36.8 C). His blood pressure is 139/65 and his pulse is 76. His respiration is 20 and oxygen saturation is 100%.   Wt Readings from Last 3 Encounters:  07/15/22 187 lb 6.4 oz (85 kg)  07/01/22 187 lb 0.8 oz (84.8 kg)  06/17/22 190 lb (86.2 kg)    Physical Exam Vitals reviewed.  HENT:     Head: Normocephalic and atraumatic.  Eyes:     Pupils: Pupils are equal, round, and reactive to light.  Neck:     Comments: His neck exam does show a cervical collar.  I did not take this off. Cardiovascular:     Rate and Rhythm: Normal rate and regular rhythm.     Heart sounds: Normal heart sounds.  Pulmonary:     Effort: Pulmonary effort is normal.     Breath sounds: Normal breath sounds.  Abdominal:     General: Bowel sounds are normal.     Palpations: Abdomen is soft.  Genitourinary:    Comments: Currently, he has an indwelling Foley catheter in. Musculoskeletal:        General: No tenderness or deformity. Normal range of motion.     Cervical back: Normal range of motion.  Lymphadenopathy:     Cervical: No cervical adenopathy.  Skin:    General: Skin is warm and  dry.     Findings: No erythema or rash.  Neurological:     Mental Status: He is alert and oriented to person, place, and time.  Psychiatric:        Behavior: Behavior normal.        Thought Content: Thought content normal.        Judgment: Judgment normal.      Lab Results  Component Value Date   WBC 4.3 07/15/2022   HGB 12.1 (L) 07/15/2022   HCT 37.4 (L) 07/15/2022   MCV 90.6 07/15/2022  PLT 223 07/15/2022     Chemistry      Component Value Date/Time   NA 140 07/15/2022 0940   K 4.4 07/15/2022 0940   CL 106 07/15/2022 0940   CO2 24 07/15/2022 0940   BUN 12 07/15/2022 0940   CREATININE 1.10 07/15/2022 0940      Component Value Date/Time   CALCIUM 9.0 07/15/2022 0940   ALKPHOS 63 07/15/2022 0940   AST 10 (L) 07/15/2022 0940   ALT 11 07/15/2022 0940   BILITOT 0.5 07/15/2022 0940      Impression and Plan: Mr. Broughton is a very nice 66 year old white male.  He has IgA kappa myeloma.  Most of his myeloma is Kappa light chain.  This I think will be the indicator for response.  It will be interesting to see how his levels look now.  We will start his third cycle of treatment today.  I still would like to get him to transplant.  We will have him back to see me in another month.    Volanda Napoleon, MD 8/31/202310:44 AM

## 2022-07-15 NOTE — Progress Notes (Signed)
Oncology Nurse Navigator Documentation     07/15/2022   10:00 AM  Oncology Nurse Navigator Flowsheets  Navigator Follow Up Date: 08/12/2022  Navigator Follow Up Reason: Follow-up Appointment;Chemotherapy  Navigator Location [facility]  Navigator Encounter Type Treatment;Appt/Treatment Plan Review  Patient Visit Type MedOnc  Treatment Phase Active Tx  Barriers/Navigation Needs Coordination of Care;Education  Interventions Psycho-Social Support  Acuity Level 2-Minimal Needs (1-2 Barriers Identified)  Support Groups/Services Friends and Family  Time Spent with Patient 15

## 2022-07-15 NOTE — Patient Instructions (Signed)
[facility]  Discharge Instructions: Thank you for choosing [facility] to provide your oncology and hematology care.   If you have a lab appointment with the [facility], please go directly to the [facility] and check in at the registration area.  Wear comfortable clothing and clothing appropriate for easy access to any Portacath or PICC line.   We strive to give you quality time with your provider. You may need to reschedule your appointment if you arrive late (15 or more minutes).  Arriving late affects you and other patients whose appointments are after yours.  Also, if you miss three or more appointments without notifying the office, you may be dismissed from the clinic at the provider's discretion.      For prescription refill requests, have your pharmacy contact our office and allow 72 hours for refills to be completed.    Today you received the following chemotherapy and/or immunotherapy agents velcade and faspro   To help prevent nausea and vomiting after your treatment, we encourage you to take your nausea medication as directed.  BELOW ARE SYMPTOMS THAT SHOULD BE REPORTED IMMEDIATELY: *FEVER GREATER THAN 100.4 F (38 C) OR HIGHER *CHILLS OR SWEATING *NAUSEA AND VOMITING THAT IS NOT CONTROLLED WITH YOUR NAUSEA MEDICATION *UNUSUAL SHORTNESS OF BREATH *UNUSUAL BRUISING OR BLEEDING *URINARY PROBLEMS (pain or burning when urinating, or frequent urination) *BOWEL PROBLEMS (unusual diarrhea, constipation, pain near the anus) TENDERNESS IN MOUTH AND THROAT WITH OR WITHOUT PRESENCE OF ULCERS (sore throat, sores in mouth, or a toothache) UNUSUAL RASH, SWELLING OR PAIN  UNUSUAL VAGINAL DISCHARGE OR ITCHING   Items with * indicate a potential emergency and should be followed up as soon as possible or go to the Emergency Department if any problems should occur.  Please show the CHEMOTHERAPY ALERT CARD or IMMUNOTHERAPY ALERT CARD at check-in to  the Emergency Department and triage nurse. Should you have questions after your visit or need to cancel or reschedule your appointment, please contact [facility]  [phone] and follow the prompts.  Office hours are 8:00 a.m. to 4:30 p.m. Monday - Friday. Please note that voicemails left after 4:00 p.m. may not be returned until the following business day.  We are closed weekends and major holidays. You have access to a nurse at all times for urgent questions. Please call the main number to the clinic [phone] and follow the prompts.  For any non-urgent questions, you may also contact your provider using MyChart. We now offer e-Visits for anyone 64 and older to request care online for non-urgent symptoms. For details visit [email or web address].   Also download the MyChart app! Go to the app store, search "MyChart", open the app, select [facility], and log in with your MyChart username and password.  Masks are optional in the cancer centers. If you would like for your care team to wear a mask while they are taking care of you, please let them know. You may have one support person who is at least 66 years old accompany you for your appointments.

## 2022-07-16 ENCOUNTER — Encounter: Payer: Self-pay | Admitting: *Deleted

## 2022-07-16 ENCOUNTER — Other Ambulatory Visit: Payer: Self-pay

## 2022-07-16 LAB — KAPPA/LAMBDA LIGHT CHAINS
Kappa free light chain: 12.6 mg/L (ref 3.3–19.4)
Kappa, lambda light chain ratio: 1.7 — ABNORMAL HIGH (ref 0.26–1.65)
Lambda free light chains: 7.4 mg/L (ref 5.7–26.3)

## 2022-07-16 LAB — BETA 2 MICROGLOBULIN, SERUM: Beta-2 Microglobulin: 2.6 mg/L — ABNORMAL HIGH (ref 0.6–2.4)

## 2022-07-16 LAB — IGG, IGA, IGM
IgA: 51 mg/dL — ABNORMAL LOW (ref 61–437)
IgG (Immunoglobin G), Serum: 295 mg/dL — ABNORMAL LOW (ref 603–1613)
IgM (Immunoglobulin M), Srm: 12 mg/dL — ABNORMAL LOW (ref 20–172)

## 2022-07-17 ENCOUNTER — Other Ambulatory Visit: Payer: Self-pay

## 2022-07-21 ENCOUNTER — Other Ambulatory Visit: Payer: Self-pay

## 2022-07-21 DIAGNOSIS — C9000 Multiple myeloma not having achieved remission: Secondary | ICD-10-CM

## 2022-07-22 ENCOUNTER — Inpatient Hospital Stay: Payer: Medicare Other

## 2022-07-22 ENCOUNTER — Inpatient Hospital Stay: Payer: Medicare Other | Attending: Hematology & Oncology

## 2022-07-22 VITALS — BP 139/69 | HR 75 | Temp 97.7°F | Resp 17

## 2022-07-22 DIAGNOSIS — Z79899 Other long term (current) drug therapy: Secondary | ICD-10-CM | POA: Insufficient documentation

## 2022-07-22 DIAGNOSIS — C9000 Multiple myeloma not having achieved remission: Secondary | ICD-10-CM | POA: Diagnosis not present

## 2022-07-22 DIAGNOSIS — Z5112 Encounter for antineoplastic immunotherapy: Secondary | ICD-10-CM | POA: Insufficient documentation

## 2022-07-22 LAB — CMP (CANCER CENTER ONLY)
ALT: 10 U/L (ref 0–44)
AST: 10 U/L — ABNORMAL LOW (ref 15–41)
Albumin: 4 g/dL (ref 3.5–5.0)
Alkaline Phosphatase: 58 U/L (ref 38–126)
Anion gap: 9 (ref 5–15)
BUN: 13 mg/dL (ref 8–23)
CO2: 26 mmol/L (ref 22–32)
Calcium: 9.2 mg/dL (ref 8.9–10.3)
Chloride: 106 mmol/L (ref 98–111)
Creatinine: 1.16 mg/dL (ref 0.61–1.24)
GFR, Estimated: >60 mL/min (ref >60)
Glucose, Bld: 96 mg/dL (ref 70–99)
Potassium: 4.2 mmol/L (ref 3.5–5.1)
Sodium: 141 mmol/L (ref 135–145)
Total Bilirubin: 0.4 mg/dL (ref 0.3–1.2)
Total Protein: 6.3 g/dL — ABNORMAL LOW (ref 6.5–8.1)

## 2022-07-22 LAB — CBC WITH DIFFERENTIAL (CANCER CENTER ONLY)
Abs Immature Granulocytes: 0.02 10*3/uL (ref 0.00–0.07)
Basophils Absolute: 0 10*3/uL (ref 0.0–0.1)
Basophils Relative: 1 %
Eosinophils Absolute: 0.1 10*3/uL (ref 0.0–0.5)
Eosinophils Relative: 2 %
HCT: 37.3 % — ABNORMAL LOW (ref 39.0–52.0)
Hemoglobin: 12.3 g/dL — ABNORMAL LOW (ref 13.0–17.0)
Immature Granulocytes: 0 %
Lymphocytes Relative: 18 %
Lymphs Abs: 1.2 10*3/uL (ref 0.7–4.0)
MCH: 29.6 pg (ref 26.0–34.0)
MCHC: 33 g/dL (ref 30.0–36.0)
MCV: 89.9 fL (ref 80.0–100.0)
Monocytes Absolute: 0.8 10*3/uL (ref 0.1–1.0)
Monocytes Relative: 12 %
Neutro Abs: 4.5 10*3/uL (ref 1.7–7.7)
Neutrophils Relative %: 67 %
Platelet Count: 217 10*3/uL (ref 150–400)
RBC: 4.15 MIL/uL — ABNORMAL LOW (ref 4.22–5.81)
RDW: 14.3 % (ref 11.5–15.5)
WBC Count: 6.7 10*3/uL (ref 4.0–10.5)
nRBC: 0 % (ref 0.0–0.2)

## 2022-07-22 MED ORDER — BORTEZOMIB CHEMO SQ INJECTION 3.5 MG (2.5MG/ML)
1.3000 mg/m2 | Freq: Once | INTRAMUSCULAR | Status: AC
  Administered 2022-07-22: 2.75 mg via SUBCUTANEOUS
  Filled 2022-07-22: qty 1.1

## 2022-07-22 NOTE — Patient Instructions (Signed)
[facility]  Discharge Instructions: Thank you for choosing [facility] to provide your oncology and hematology care.   If you have a lab appointment with the Cancer Center, please go directly to the Cancer Center and check in at the registration area.  Wear comfortable clothing and clothing appropriate for easy access to any Portacath or PICC line.   We strive to give you quality time with your provider. You may need to reschedule your appointment if you arrive late (15 or more minutes).  Arriving late affects you and other patients whose appointments are after yours.  Also, if you miss three or more appointments without notifying the office, you may be dismissed from the clinic at the provider's discretion.      For prescription refill requests, have your pharmacy contact our office and allow 72 hours for refills to be completed.    Today you received the following chemotherapy and/or immunotherapy agents velcade    To help prevent nausea and vomiting after your treatment, we encourage you to take your nausea medication as directed.  BELOW ARE SYMPTOMS THAT SHOULD BE REPORTED IMMEDIATELY: *FEVER GREATER THAN 100.4 F (38 C) OR HIGHER *CHILLS OR SWEATING *NAUSEA AND VOMITING THAT IS NOT CONTROLLED WITH YOUR NAUSEA MEDICATION *UNUSUAL SHORTNESS OF BREATH *UNUSUAL BRUISING OR BLEEDING *URINARY PROBLEMS (pain or burning when urinating, or frequent urination) *BOWEL PROBLEMS (unusual diarrhea, constipation, pain near the anus) TENDERNESS IN MOUTH AND THROAT WITH OR WITHOUT PRESENCE OF ULCERS (sore throat, sores in mouth, or a toothache) UNUSUAL RASH, SWELLING OR PAIN  UNUSUAL VAGINAL DISCHARGE OR ITCHING   Items with * indicate a potential emergency and should be followed up as soon as possible or go to the Emergency Department if any problems should occur.  Please show the CHEMOTHERAPY ALERT CARD or IMMUNOTHERAPY ALERT CARD at check-in to the  Emergency Department and triage nurse. Should you have questions after your visit or need to cancel or reschedule your appointment, please contact [facility]  [phone] and follow the prompts.  Office hours are 8:00 a.m. to 4:30 p.m. Monday - Friday. Please note that voicemails left after 4:00 p.m. may not be returned until the following business day.  We are closed weekends and major holidays. You have access to a nurse at all times for urgent questions. Please call the main number to the clinic [phone] and follow the prompts.  For any non-urgent questions, you may also contact your provider using MyChart. We now offer e-Visits for anyone 18 and older to request care online for non-urgent symptoms. For details visit [email or web address].   Also download the MyChart app! Go to the app store, search "MyChart", open the app, select , and log in with your MyChart username and password.  Masks are optional in the cancer centers. If you would like for your care team to wear a mask while they are taking care of you, please let them know. You may have one support person who is at least 66 years old accompany you for your appointments. 

## 2022-07-27 ENCOUNTER — Other Ambulatory Visit: Payer: Self-pay | Admitting: Hematology & Oncology

## 2022-07-28 ENCOUNTER — Other Ambulatory Visit: Payer: Self-pay

## 2022-07-28 ENCOUNTER — Encounter: Payer: Self-pay | Admitting: Radiation Oncology

## 2022-07-28 DIAGNOSIS — C9000 Multiple myeloma not having achieved remission: Secondary | ICD-10-CM

## 2022-07-29 ENCOUNTER — Inpatient Hospital Stay: Payer: Medicare Other

## 2022-07-29 VITALS — BP 152/71 | HR 71 | Temp 98.0°F | Resp 16

## 2022-07-29 DIAGNOSIS — C9000 Multiple myeloma not having achieved remission: Secondary | ICD-10-CM

## 2022-07-29 DIAGNOSIS — Z5112 Encounter for antineoplastic immunotherapy: Secondary | ICD-10-CM | POA: Diagnosis not present

## 2022-07-29 LAB — CMP (CANCER CENTER ONLY)
ALT: 11 U/L (ref 0–44)
AST: 9 U/L — ABNORMAL LOW (ref 15–41)
Albumin: 3.9 g/dL (ref 3.5–5.0)
Alkaline Phosphatase: 54 U/L (ref 38–126)
Anion gap: 9 (ref 5–15)
BUN: 12 mg/dL (ref 8–23)
CO2: 26 mmol/L (ref 22–32)
Calcium: 9.2 mg/dL (ref 8.9–10.3)
Chloride: 107 mmol/L (ref 98–111)
Creatinine: 1.12 mg/dL (ref 0.61–1.24)
GFR, Estimated: >60 mL/min (ref >60)
Glucose, Bld: 103 mg/dL — ABNORMAL HIGH (ref 70–99)
Potassium: 4 mmol/L (ref 3.5–5.1)
Sodium: 142 mmol/L (ref 135–145)
Total Bilirubin: 0.5 mg/dL (ref 0.3–1.2)
Total Protein: 6.4 g/dL — ABNORMAL LOW (ref 6.5–8.1)

## 2022-07-29 LAB — CBC WITH DIFFERENTIAL (CANCER CENTER ONLY)
Abs Immature Granulocytes: 0.04 10*3/uL (ref 0.00–0.07)
Basophils Absolute: 0.1 10*3/uL (ref 0.0–0.1)
Basophils Relative: 1 %
Eosinophils Absolute: 0.1 10*3/uL (ref 0.0–0.5)
Eosinophils Relative: 2 %
HCT: 36.1 % — ABNORMAL LOW (ref 39.0–52.0)
Hemoglobin: 12 g/dL — ABNORMAL LOW (ref 13.0–17.0)
Immature Granulocytes: 1 %
Lymphocytes Relative: 14 %
Lymphs Abs: 1.1 10*3/uL (ref 0.7–4.0)
MCH: 29.6 pg (ref 26.0–34.0)
MCHC: 33.2 g/dL (ref 30.0–36.0)
MCV: 88.9 fL (ref 80.0–100.0)
Monocytes Absolute: 0.6 10*3/uL (ref 0.1–1.0)
Monocytes Relative: 7 %
Neutro Abs: 6 10*3/uL (ref 1.7–7.7)
Neutrophils Relative %: 75 %
Platelet Count: 225 10*3/uL (ref 150–400)
RBC: 4.06 MIL/uL — ABNORMAL LOW (ref 4.22–5.81)
RDW: 14.4 % (ref 11.5–15.5)
WBC Count: 7.9 10*3/uL (ref 4.0–10.5)
nRBC: 0 % (ref 0.0–0.2)

## 2022-07-29 MED ORDER — DIPHENHYDRAMINE HCL 25 MG PO CAPS
50.0000 mg | ORAL_CAPSULE | Freq: Once | ORAL | Status: AC
  Administered 2022-07-29: 50 mg via ORAL
  Filled 2022-07-29: qty 2

## 2022-07-29 MED ORDER — BORTEZOMIB CHEMO SQ INJECTION 3.5 MG (2.5MG/ML)
1.3000 mg/m2 | Freq: Once | INTRAMUSCULAR | Status: AC
  Administered 2022-07-29: 2.75 mg via SUBCUTANEOUS
  Filled 2022-07-29: qty 1.1

## 2022-07-29 MED ORDER — ZOLEDRONIC ACID 4 MG/100ML IV SOLN
4.0000 mg | Freq: Once | INTRAVENOUS | Status: AC
  Administered 2022-07-29: 4 mg via INTRAVENOUS
  Filled 2022-07-29: qty 100

## 2022-07-29 MED ORDER — SODIUM CHLORIDE 0.9 % IV SOLN: Freq: Once | INTRAVENOUS | Status: AC

## 2022-07-29 MED ORDER — ACETAMINOPHEN 325 MG PO TABS
650.0000 mg | ORAL_TABLET | Freq: Once | ORAL | Status: AC
  Administered 2022-07-29: 650 mg via ORAL
  Filled 2022-07-29: qty 2

## 2022-07-29 MED ORDER — DARATUMUMAB-HYALURONIDASE-FIHJ 1800-30000 MG-UT/15ML ~~LOC~~ SOLN
1800.0000 mg | Freq: Once | SUBCUTANEOUS | Status: AC
  Administered 2022-07-29: 1800 mg via SUBCUTANEOUS
  Filled 2022-07-29: qty 15

## 2022-08-01 NOTE — Progress Notes (Signed)
Radiation Oncology         [phone] ________________________________  Name: Barry Gray MRN: [id number]  Date: 08/02/2022  DOB: [birth year]  Follow-Up Visit Note  CC: Ferd Hibbs, NP  Volanda Napoleon, MD  No diagnosis found.  Diagnosis: The encounter diagnosis was Multiple myeloma not having achieved remission ([place]).   IgA kappa myeloma   Cancer Staging  Multiple myeloma ([place]) Staging form: Plasma Cell Myeloma and Plasma Cell Disorders, AJCC 8th Edition - Clinical stage from 05/07/2022: Beta-2-microglobulin (mg/L): 2.1, Albumin (g/dL): 4, ISS: Stage I, High-risk cytogenetics: Unknown, LDH: Normal - Signed by Volanda Napoleon, MD on 05/10/2022  Interval Since Last Radiation: 1 month and 3 days   Indication for treatment: Palliative      Radiation treatment dates:  06/17/22 through 06/29/22 Site/dose: Cervical spine treated in total with 22.50Gy, delivered in 9 fractions in 2.50Gy/Fx (Intended dose was for 35 Gy delivered in 14 fractions) Beams/energy:  10X  Narrative:  The patient returns today for routine follow-up. The patient did not tolerate radiation treatment all that well. During his final weekly treatment check on 06/28/22, the patient reported throat swelling which made it difficult for him to eat, indicative of radiation induced pharyngitis. He was offered prescription pain medication however the patient was hesitant to take over the counter pain medication to begin with. After a thorough discussion, the patient opted to stop his treatment early.   Since the patient was seen in consultation, he had a bone marrow biopsy performed on 06/15/22 which showed a hypercellular marrow with only 1% plasma cells.   Since completing radiation, the patient has continued on with Faspro/Velcade/Revlimid/Decadron under Dr. Marin Olp. During a follow up visit with Dr. Marin Olp on 07/01/22, the patient was noted to be doing better from a mobility standpoint (only needs his cane) but  complained of some ongoing odynophagia and some dysphagia from the radiation.   During his most recent follow up with Dr. Marin Olp on 07/15/22, the patient was noted to appear quite well and denied any pain. However, the patient was noted to report a decrease in energy 2 days after taking oral decadron. For this, Dr. Marin Olp recommended the patient to try tapering decadron.   (Patient proceeded with cycle 3 of systemic treatment on 07/15/22).   Most recent labs on 07/15/22 showed a substantial decrease in the patient's light chain from 500 down to 13.   ***                                Allergies:  has No Known Allergies.  Meds: Current Outpatient Medications  Medication Sig Dispense Refill   aspirin 325 MG tablet Take 325 mg by mouth daily.     diltiazem 2 % GEL Apply 1 Application topically 2 (two) times daily. (Patient not taking: Reported on 07/15/2022) 1 g 5   famciclovir (FAMVIR) 250 MG tablet Take 1 tablet (250 mg total) by mouth daily. 30 tablet 12   glipiZIDE (GLUCOTROL) 5 MG tablet Take by mouth 2 (two) times daily before a meal.     lactulose (CHRONULAC) 10 GM/15ML solution TAKE 20 MLS BY MOUTH EVERY 8 HOURS AS NEEDED 473 mL 1   lenalidomide (REVLIMID) 25 MG capsule Take one capsule by mouth for 21 days ON, then OFF for seven days. [employer] #[id number] 21 capsule 0   lisinopril (ZESTRIL) 10 MG tablet Take 10 mg by mouth daily as needed (blood  pressure of 160/85). (Patient not taking: Reported on 06/02/2022)     LORazepam (ATIVAN) 0.5 MG tablet Take 1 tablet (0.5 mg total) by mouth every 6 (six) hours as needed (Nausea or vomiting). (Patient not taking: Reported on 05/24/2022) 30 tablet 0   metFORMIN (GLUCOPHAGE) 500 MG tablet Take 500 mg by mouth 2 (two) times daily with a meal.     methocarbamol (ROBAXIN) 500 MG tablet Take 1 tablet (500 mg total) by mouth every 6 (six) hours as needed for muscle spasms. (Patient not taking: Reported on 05/24/2022) 30 tablet 2   montelukast  (SINGULAIR) 10 MG tablet Take 1 tablet (10 mg total) by mouth at bedtime. Start Singulair 2 days before you start the chemotherapy in July. (Patient not taking: Reported on 07/15/2022) 7 tablet 0   ondansetron (ZOFRAN) 8 MG tablet Take 1 tablet (8 mg total) by mouth 2 (two) times daily as needed (Nausea or vomiting). (Patient not taking: Reported on 05/24/2022) 30 tablet 1   oxyCODONE-acetaminophen (PERCOCET) 10-325 MG tablet Take 1 tablet by mouth every 4 (four) hours as needed for pain. (Patient not taking: Reported on 07/15/2022)     oxyCODONE-acetaminophen (PERCOCET/ROXICET) 5-325 MG tablet Take 1-2 tablets by mouth every 4 (four) hours as needed for severe pain. (Patient not taking: Reported on 05/24/2022) 30 tablet 0   polyethylene glycol (MIRALAX / GLYCOLAX) 17 g packet Take 17 g by mouth daily as needed for moderate constipation. (Patient not taking: Reported on 07/15/2022)     prochlorperazine (COMPAZINE) 10 MG tablet Take 1 tablet (10 mg total) by mouth every 6 (six) hours as needed (Nausea or vomiting). (Patient not taking: Reported on 05/24/2022) 30 tablet 1   tamsulosin (FLOMAX) 0.4 MG CAPS capsule Take 0.4 mg by mouth daily.     No current facility-administered medications for this encounter.    Physical Findings: The patient is in no acute distress. Patient is alert and oriented.  vitals were not taken for this visit. .  No significant changes. Lungs are clear to auscultation bilaterally. Heart has regular rate and rhythm. No palpable cervical, supraclavicular, or axillary adenopathy. Abdomen soft, non-tender, normal bowel sounds.   Lab Findings: Lab Results  Component Value Date   WBC 7.9 07/29/2022   HGB 12.0 (L) 07/29/2022   HCT 36.1 (L) 07/29/2022   MCV 88.9 07/29/2022   PLT 225 07/29/2022    Radiographic Findings: No results found.  Impression: The encounter diagnosis was Multiple myeloma not having achieved remission ([place]).   IgA kappa myeloma  The patient is  recovering from the effects of radiation.  ***  Plan:  ***   *** minutes of total time was spent for this patient encounter, including preparation, face-to-face counseling with the patient and coordination of care, physical exam, and documentation of the encounter. ____________________________________  Blair Promise, PhD, MD  This document serves as a record of services personally performed by Gery Pray, MD. It was created on his behalf by Roney Mans, a trained medical scribe. The creation of this record is based on the scribe's personal observations and the provider's statements to them. This document has been checked and approved by the attending provider.

## 2022-08-01 NOTE — Progress Notes (Incomplete)
  Radiation Oncology         [phone] ________________________________  Name: Barry Gray MRN: [id number]  Date: 06/29/2022  DOB: 1956-08-09  End of Treatment Note  Diagnosis: The encounter diagnosis was Multiple myeloma not having achieved remission ([place]).   IgA kappa myeloma    Cancer Staging  Multiple myeloma ([place]) Staging form: Plasma Cell Myeloma and Plasma Cell Disorders, AJCC 8th Edition - Clinical stage from 05/07/2022: Beta-2-microglobulin (mg/L): 2.1, Albumin (g/dL): 4, ISS: Stage I, High-risk cytogenetics: Unknown, LDH: Normal - Signed by Volanda Napoleon, MD on 05/10/2022  Indication for treatment: Palliative       Radiation treatment dates:  06/17/22 through 06/29/22  Site/dose:  cervical spine treated in total with 22.50Gy, delivered in 9 fractions in 2.50Gy/Fx (Intended dose was for 35 Gy delivered in 14 fractions)  Beams/energy:  10X  Narrative: The patient did not tolerate radiation treatment all that well. During his final weekly treatment check on 06/28/22, the patient reported throat swelling which made it difficult for him to eat, indicative of radiation induced pharyngitis. He was offered prescription pain medication however the patient was hesitant to take over the counter pain medication to begin with. After a thorough discussion, the patient opted to stop his treatments early.   Plan: The patient has completed radiation treatment. The patient will return to radiation oncology clinic for routine followup in one month. I advised them to call or return sooner if they have any questions or concerns related to their recovery or treatment.  -----------------------------------  Blair Promise, PhD, MD  This document serves as a record of services personally performed by Gery Pray, MD. It was created on his behalf by Roney Mans, a trained medical scribe. The creation of this record is based on the scribe's personal observations and the provider's  statements to them. This document has been checked and approved by the attending provider.

## 2022-08-02 ENCOUNTER — Ambulatory Visit
Admission: RE | Admit: 2022-08-02 | Discharge: 2022-08-02 | Disposition: A | Discharge status: Home / Self Care | Payer: Medicare Other | Source: Ambulatory Visit | Attending: Radiation Oncology | Admitting: Radiation Oncology

## 2022-08-02 DIAGNOSIS — C9000 Multiple myeloma not having achieved remission: Secondary | ICD-10-CM | POA: Insufficient documentation

## 2022-08-02 HISTORY — DX: Personal history of irradiation: Z92.3

## 2022-08-02 NOTE — Progress Notes (Signed)
Barry Gray is here for follow up after finishing radiation to his C-Spine on 06/29/2022.    Pain: none Swallowing: feels like his uvula is swollen.  Swallowing is improving day by day. Skin irritation: none Fatigue: feels fatigued after activity.  Other issues: Had injections for chemo last Thursday.    BP 124/62 (BP Location: Right Arm, Patient Position: Sitting, Cuff Size: Large)   Pulse 81   Temp 97.9 F (36.6 C)   Resp 20   Ht '5\' 10"'$  (1.778 m)   Wt 198 lb 12.8 oz (90.2 kg)   SpO2 100%   BMI 28.52 kg/m

## 2022-08-04 ENCOUNTER — Telehealth: Payer: Self-pay

## 2022-08-04 NOTE — Telephone Encounter (Signed)
Received message from Wells Guiles, pharmacist stating that pt had informed [facility] that he was no longer taking Revlimid. Per Dr Marin Olp, Revlimid was d/c due to rash and diarrhea. Wells Guiles aware of this change. dph

## 2022-08-05 ENCOUNTER — Other Ambulatory Visit: Payer: Self-pay

## 2022-08-10 ENCOUNTER — Other Ambulatory Visit (HOSPITAL_BASED_OUTPATIENT_CLINIC_OR_DEPARTMENT_OTHER): Payer: Self-pay

## 2022-08-10 ENCOUNTER — Other Ambulatory Visit: Payer: Self-pay | Admitting: Hematology & Oncology

## 2022-08-10 DIAGNOSIS — C9000 Multiple myeloma not having achieved remission: Secondary | ICD-10-CM

## 2022-08-10 MED ORDER — DEXAMETHASONE 4 MG PO TABS
20.0000 mg | ORAL_TABLET | ORAL | 3 refills | Status: DC
  Filled 2022-08-10: qty 60, 84d supply, fill #0
  Filled 2022-11-12: qty 60, 84d supply, fill #1

## 2022-08-12 ENCOUNTER — Encounter: Payer: Self-pay | Admitting: Hematology & Oncology

## 2022-08-12 ENCOUNTER — Inpatient Hospital Stay (HOSPITAL_BASED_OUTPATIENT_CLINIC_OR_DEPARTMENT_OTHER): Payer: Medicare Other | Admitting: Hematology & Oncology

## 2022-08-12 ENCOUNTER — Encounter: Payer: Self-pay | Admitting: *Deleted

## 2022-08-12 ENCOUNTER — Other Ambulatory Visit: Payer: Self-pay

## 2022-08-12 ENCOUNTER — Inpatient Hospital Stay: Payer: Medicare Other

## 2022-08-12 VITALS — BP 135/65 | HR 72 | Resp 18

## 2022-08-12 VITALS — BP 140/67 | HR 63 | Temp 97.6°F | Resp 18 | Ht 70.0 in | Wt 192.1 lb

## 2022-08-12 DIAGNOSIS — C9000 Multiple myeloma not having achieved remission: Secondary | ICD-10-CM

## 2022-08-12 DIAGNOSIS — Z5112 Encounter for antineoplastic immunotherapy: Secondary | ICD-10-CM | POA: Diagnosis not present

## 2022-08-12 LAB — CMP (CANCER CENTER ONLY)
ALT: 11 U/L (ref 0–44)
AST: 10 U/L — ABNORMAL LOW (ref 15–41)
Albumin: 4.2 g/dL (ref 3.5–5.0)
Alkaline Phosphatase: 52 U/L (ref 38–126)
Anion gap: 9 (ref 5–15)
BUN: 11 mg/dL (ref 8–23)
CO2: 24 mmol/L (ref 22–32)
Calcium: 9.2 mg/dL (ref 8.9–10.3)
Chloride: 107 mmol/L (ref 98–111)
Creatinine: 1.18 mg/dL (ref 0.61–1.24)
GFR, Estimated: >60 mL/min (ref >60)
Glucose, Bld: 113 mg/dL — ABNORMAL HIGH (ref 70–99)
Potassium: 4.6 mmol/L (ref 3.5–5.1)
Sodium: 140 mmol/L (ref 135–145)
Total Bilirubin: 0.4 mg/dL (ref 0.3–1.2)
Total Protein: 6.7 g/dL (ref 6.5–8.1)

## 2022-08-12 LAB — CBC WITH DIFFERENTIAL (CANCER CENTER ONLY)
Abs Immature Granulocytes: 0.03 10*3/uL (ref 0.00–0.07)
Basophils Absolute: 0.1 10*3/uL (ref 0.0–0.1)
Basophils Relative: 1 %
Eosinophils Absolute: 0.3 10*3/uL (ref 0.0–0.5)
Eosinophils Relative: 3 %
HCT: 38.6 % — ABNORMAL LOW (ref 39.0–52.0)
Hemoglobin: 12.7 g/dL — ABNORMAL LOW (ref 13.0–17.0)
Immature Granulocytes: 0 %
Lymphocytes Relative: 13 %
Lymphs Abs: 1.2 10*3/uL (ref 0.7–4.0)
MCH: 29.1 pg (ref 26.0–34.0)
MCHC: 32.9 g/dL (ref 30.0–36.0)
MCV: 88.3 fL (ref 80.0–100.0)
Monocytes Absolute: 0.7 10*3/uL (ref 0.1–1.0)
Monocytes Relative: 7 %
Neutro Abs: 7.5 10*3/uL (ref 1.7–7.7)
Neutrophils Relative %: 76 %
Platelet Count: 263 10*3/uL (ref 150–400)
RBC: 4.37 MIL/uL (ref 4.22–5.81)
RDW: 14.4 % (ref 11.5–15.5)
WBC Count: 9.7 10*3/uL (ref 4.0–10.5)
nRBC: 0 % (ref 0.0–0.2)

## 2022-08-12 LAB — LACTATE DEHYDROGENASE: LDH: 133 U/L (ref 98–192)

## 2022-08-12 MED ORDER — DARATUMUMAB-HYALURONIDASE-FIHJ 1800-30000 MG-UT/15ML ~~LOC~~ SOLN
1800.0000 mg | Freq: Once | SUBCUTANEOUS | Status: AC
  Administered 2022-08-12: 1800 mg via SUBCUTANEOUS
  Filled 2022-08-12: qty 15

## 2022-08-12 MED ORDER — BORTEZOMIB CHEMO SQ INJECTION 3.5 MG (2.5MG/ML)
1.3000 mg/m2 | Freq: Once | INTRAMUSCULAR | Status: AC
  Administered 2022-08-12: 2.75 mg via SUBCUTANEOUS
  Filled 2022-08-12: qty 1.1

## 2022-08-12 MED ORDER — ACETAMINOPHEN 325 MG PO TABS
650.0000 mg | ORAL_TABLET | Freq: Once | ORAL | Status: AC
  Administered 2022-08-12: 650 mg via ORAL
  Filled 2022-08-12: qty 2

## 2022-08-12 MED ORDER — DIPHENHYDRAMINE HCL 25 MG PO CAPS
50.0000 mg | ORAL_CAPSULE | Freq: Once | ORAL | Status: AC
  Administered 2022-08-12: 50 mg via ORAL
  Filled 2022-08-12: qty 2

## 2022-08-12 NOTE — Progress Notes (Signed)
Patient refused to wait 1 hour post Darzalex injection. Released stable and ASX. 

## 2022-08-12 NOTE — Progress Notes (Signed)
No navigational needs at this time. Depending on myeloma studies, may need bone marrow scheduled. Will follow for results.   Oncology Nurse Navigator Documentation     08/12/2022    8:30 AM  Oncology Nurse Navigator Flowsheets  Navigator Follow Up Date: 08/18/2022  Navigator Follow Up Reason: Test Results  Navigator Location [facility]  Navigator Encounter Type Treatment;Appt/Treatment Plan Review  Patient Visit Type MedOnc  Treatment Phase Active Tx  Barriers/Navigation Needs Coordination of Care;Education  Interventions Psycho-Social Support  Acuity Level 2-Minimal Needs (1-2 Barriers Identified)  Support Groups/Services Friends and Family  Time Spent with Patient 15

## 2022-08-12 NOTE — Progress Notes (Signed)
Hematology and Oncology Follow Up Visit  Barry Gray [id number] 1956/01/04 66 y.o. 08/12/2022   Principle Diagnosis:  IgA Kappa myeloma  Current Therapy:   S/p cervical spine stabilization -- 04/28/2022 Faspro/Velcade/Decadron -- s/p cycle #2 -- start on 05/20/2022 Zometa 4 mg IV q 3 months -- next dose on 10/2022     Interim History:  Barry Gray is back for follow-up.  He is doing incredibly well.  He looks so much better and so much healthier.  He is exercising more.  He is getting back on his bicycle.  His myeloma studies have improved remarkably.  His IgA level went from 600 down to 50 mg/dL.  His Kappa light chain went from 49 mg/dL down to 1.3 mg/dL.  I think that he is ahead of schedule for having a stem cell transplant.  We have him off the Revlimid.  He cannot tolerate this because of a bad rash that he would get.  He has had no diarrhea.  He has had no cough or shortness of breath.  His neck surgery has healed up nicely.  He goes back to see Dr. Lynann Bologna on Monday.  He has had no bleeding.  Overall, I would say his performance status is ECOG 1.    Medications:  Current Outpatient Medications:    aspirin 325 MG tablet, Take 325 mg by mouth daily., Disp: , Rfl:    dexamethasone (DECADRON) 4 MG tablet, Take 5 tablets (20 mg total) by mouth once a week. Start on 05/20/2022, Disp: 100 tablet, Rfl: 3   diltiazem 2 % GEL, Apply 1 Application topically 2 (two) times daily. (Patient not taking: Reported on 07/15/2022), Disp: 1 g, Rfl: 5   famciclovir (FAMVIR) 250 MG tablet, Take 1 tablet (250 mg total) by mouth daily., Disp: 30 tablet, Rfl: 12   glipiZIDE (GLUCOTROL) 5 MG tablet, Take by mouth 2 (two) times daily before a meal., Disp: , Rfl:    lactulose (CHRONULAC) 10 GM/15ML solution, TAKE 20 MLS BY MOUTH EVERY 8 HOURS AS NEEDED, Disp: 473 mL, Rfl: 1   lenalidomide (REVLIMID) 25 MG capsule, Take one capsule by mouth for 21 days ON, then OFF for seven days. [employer] Josem Kaufmann  #[id number], Disp: 21 capsule, Rfl: 0   lisinopril (ZESTRIL) 10 MG tablet, Take 10 mg by mouth daily as needed (blood pressure of 160/85). (Patient not taking: Reported on 06/02/2022), Disp: , Rfl:    LORazepam (ATIVAN) 0.5 MG tablet, Take 1 tablet (0.5 mg total) by mouth every 6 (six) hours as needed (Nausea or vomiting). (Patient not taking: Reported on 05/24/2022), Disp: 30 tablet, Rfl: 0   metFORMIN (GLUCOPHAGE) 500 MG tablet, Take 500 mg by mouth 2 (two) times daily with a meal., Disp: , Rfl:    methocarbamol (ROBAXIN) 500 MG tablet, Take 1 tablet (500 mg total) by mouth every 6 (six) hours as needed for muscle spasms. (Patient not taking: Reported on 05/24/2022), Disp: 30 tablet, Rfl: 2   montelukast (SINGULAIR) 10 MG tablet, Take 1 tablet (10 mg total) by mouth at bedtime. Start Singulair 2 days before you start the chemotherapy in July. (Patient not taking: Reported on 07/15/2022), Disp: 7 tablet, Rfl: 0   ondansetron (ZOFRAN) 8 MG tablet, Take 1 tablet (8 mg total) by mouth 2 (two) times daily as needed (Nausea or vomiting). (Patient not taking: Reported on 05/24/2022), Disp: 30 tablet, Rfl: 1   oxyCODONE-acetaminophen (PERCOCET) 10-325 MG tablet, Take 1 tablet by mouth every 4 (four) hours as needed for  pain. (Patient not taking: Reported on 07/15/2022), Disp: , Rfl:    oxyCODONE-acetaminophen (PERCOCET/ROXICET) 5-325 MG tablet, Take 1-2 tablets by mouth every 4 (four) hours as needed for severe pain. (Patient not taking: Reported on 05/24/2022), Disp: 30 tablet, Rfl: 0   polyethylene glycol (MIRALAX / GLYCOLAX) 17 g packet, Take 17 g by mouth daily as needed for moderate constipation. (Patient not taking: Reported on 07/15/2022), Disp: , Rfl:    prochlorperazine (COMPAZINE) 10 MG tablet, Take 1 tablet (10 mg total) by mouth every 6 (six) hours as needed (Nausea or vomiting). (Patient not taking: Reported on 05/24/2022), Disp: 30 tablet, Rfl: 1   tamsulosin (FLOMAX) 0.4 MG CAPS capsule, Take 0.4 mg by  mouth daily., Disp: , Rfl:   Allergies: No Known Allergies  Past Medical History, Surgical history, Social history, and Family History were reviewed and updated.  Review of Systems: Review of Systems  Constitutional: Negative.   HENT:  Negative.    Eyes: Negative.   Respiratory: Negative.    Cardiovascular: Negative.   Gastrointestinal: Negative.   Endocrine: Negative.   Genitourinary:  Positive for difficulty urinating. Negative for bladder incontinence.   Musculoskeletal:  Positive for neck pain.  Skin: Negative.   Neurological: Negative.   Hematological: Negative.   Psychiatric/Behavioral: Negative.      Physical Exam:  vitals were not taken for this visit.   Wt Readings from Last 3 Encounters:  08/02/22 198 lb 12.8 oz (90.2 kg)  07/15/22 187 lb 6.4 oz (85 kg)  07/01/22 187 lb 0.8 oz (84.8 kg)  His vital signs show temperature of 97.6.  Pulse 63.  Blood pressure 140/67.  Weight is 192 pounds.  Physical Exam Vitals reviewed.  HENT:     Head: Normocephalic and atraumatic.  Eyes:     Pupils: Pupils are equal, round, and reactive to light.  Neck:     Comments: His neck exam does show the healing cervical scar on the back of the neck.  This is a little bit firm.  He has no obvious adenopathy in the neck.  Cardiovascular:     Rate and Rhythm: Normal rate and regular rhythm.     Heart sounds: Normal heart sounds.  Pulmonary:     Effort: Pulmonary effort is normal.     Breath sounds: Normal breath sounds.  Abdominal:     General: Bowel sounds are normal.     Palpations: Abdomen is soft.  Musculoskeletal:        General: No tenderness or deformity. Normal range of motion.     Cervical back: Normal range of motion.  Lymphadenopathy:     Cervical: No cervical adenopathy.  Skin:    General: Skin is warm and dry.     Findings: No erythema or rash.  Neurological:     Mental Status: He is alert and oriented to person, place, and time.  Psychiatric:        Behavior:  Behavior normal.        Thought Content: Thought content normal.        Judgment: Judgment normal.      Lab Results  Component Value Date   WBC 9.7 08/12/2022   HGB 12.7 (L) 08/12/2022   HCT 38.6 (L) 08/12/2022   MCV 88.3 08/12/2022   PLT 263 08/12/2022     Chemistry      Component Value Date/Time   NA 142 07/29/2022 0912   K 4.0 07/29/2022 0912   CL 107 07/29/2022 0912   CO2  26 07/29/2022 0912   BUN 12 07/29/2022 0912   CREATININE 1.12 07/29/2022 0912      Component Value Date/Time   CALCIUM 9.2 07/29/2022 0912   ALKPHOS 54 07/29/2022 0912   AST 9 (L) 07/29/2022 0912   ALT 11 07/29/2022 0912   BILITOT 0.5 07/29/2022 0912      Impression and Plan: Mr. Colligan is a very nice 66 year old white male.  He has IgA kappa myeloma.  Most of his myeloma is Kappa light chain.  Again, his light chains come down incredibly well.  We will see what his numbers are now.  We are going to do a 24-hour urine on him.  Again I think he is ahead of schedule with respect to transplant.  If we see that his numbers are in the range of complete response a very good partial response, then we will see about doing another bone marrow test on him and then see about getting over to transplant.  I am just happy that his quality of life is so much better now.  His birthday yesterday.  He had a wonderful birthday.  We will plan to get him back in another month.  We will get his Zometa today.     Volanda Napoleon, MD 9/28/20238:03 AM

## 2022-08-13 ENCOUNTER — Other Ambulatory Visit: Payer: Self-pay

## 2022-08-13 LAB — KAPPA/LAMBDA LIGHT CHAINS
Kappa free light chain: 11.1 mg/L (ref 3.3–19.4)
Kappa, lambda light chain ratio: 2.58 — ABNORMAL HIGH (ref 0.26–1.65)
Lambda free light chains: 4.3 mg/L — ABNORMAL LOW (ref 5.7–26.3)

## 2022-08-14 LAB — IGG, IGA, IGM
IgA: 46 mg/dL — ABNORMAL LOW (ref 61–437)
IgG (Immunoglobin G), Serum: 300 mg/dL — ABNORMAL LOW (ref 603–1613)
IgM (Immunoglobulin M), Srm: 11 mg/dL — ABNORMAL LOW (ref 20–172)

## 2022-08-16 ENCOUNTER — Other Ambulatory Visit: Payer: Self-pay | Admitting: Hematology & Oncology

## 2022-08-16 ENCOUNTER — Other Ambulatory Visit: Payer: Self-pay

## 2022-08-17 LAB — IMMUNOFIXATION REFLEX, SERUM
IgA: 44 mg/dL — ABNORMAL LOW (ref 61–437)
IgG (Immunoglobin G), Serum: 266 mg/dL — ABNORMAL LOW (ref 603–1613)
IgM (Immunoglobulin M), Srm: 10 mg/dL — ABNORMAL LOW (ref 20–172)

## 2022-08-17 LAB — PROTEIN ELECTROPHORESIS, SERUM, WITH REFLEX
A/G Ratio: 1.3 (ref 0.7–1.7)
Albumin ELP: 3.5 g/dL (ref 2.9–4.4)
Alpha-1-Globulin: 0.3 g/dL (ref 0.0–0.4)
Alpha-2-Globulin: 1.2 g/dL — ABNORMAL HIGH (ref 0.4–1.0)
Beta Globulin: 0.8 g/dL (ref 0.7–1.3)
Gamma Globulin: 0.3 g/dL — ABNORMAL LOW (ref 0.4–1.8)
Globulin, Total: 2.6 g/dL (ref 2.2–3.9)
M-Spike, %: 0.1 g/dL — ABNORMAL HIGH
SPEP Interpretation: 0
Total Protein ELP: 6.1 g/dL (ref 6.0–8.5)

## 2022-08-19 ENCOUNTER — Encounter: Payer: Self-pay | Admitting: *Deleted

## 2022-08-19 ENCOUNTER — Inpatient Hospital Stay: Payer: Medicare Other | Attending: Hematology & Oncology

## 2022-08-19 ENCOUNTER — Inpatient Hospital Stay: Payer: Medicare Other

## 2022-08-19 VITALS — BP 169/91 | HR 68 | Temp 97.8°F | Resp 18 | Ht 70.0 in

## 2022-08-19 DIAGNOSIS — Z79899 Other long term (current) drug therapy: Secondary | ICD-10-CM | POA: Diagnosis not present

## 2022-08-19 DIAGNOSIS — C9000 Multiple myeloma not having achieved remission: Secondary | ICD-10-CM | POA: Insufficient documentation

## 2022-08-19 DIAGNOSIS — Z5112 Encounter for antineoplastic immunotherapy: Secondary | ICD-10-CM | POA: Diagnosis present

## 2022-08-19 LAB — CMP (CANCER CENTER ONLY)
ALT: 9 U/L (ref 0–44)
AST: 9 U/L — ABNORMAL LOW (ref 15–41)
Albumin: 4.2 g/dL (ref 3.5–5.0)
Alkaline Phosphatase: 54 U/L (ref 38–126)
Anion gap: 9 (ref 5–15)
BUN: 13 mg/dL (ref 8–23)
CO2: 25 mmol/L (ref 22–32)
Calcium: 9.7 mg/dL (ref 8.9–10.3)
Chloride: 105 mmol/L (ref 98–111)
Creatinine: 1.18 mg/dL (ref 0.61–1.24)
GFR, Estimated: >60 mL/min (ref >60)
Glucose, Bld: 119 mg/dL — ABNORMAL HIGH (ref 70–99)
Potassium: 4.5 mmol/L (ref 3.5–5.1)
Sodium: 139 mmol/L (ref 135–145)
Total Bilirubin: 0.5 mg/dL (ref 0.3–1.2)
Total Protein: 6.6 g/dL (ref 6.5–8.1)

## 2022-08-19 LAB — CBC WITH DIFFERENTIAL (CANCER CENTER ONLY)
Abs Immature Granulocytes: 0.04 10*3/uL (ref 0.00–0.07)
Basophils Absolute: 0 10*3/uL (ref 0.0–0.1)
Basophils Relative: 0 %
Eosinophils Absolute: 0.3 10*3/uL (ref 0.0–0.5)
Eosinophils Relative: 3 %
HCT: 38.8 % — ABNORMAL LOW (ref 39.0–52.0)
Hemoglobin: 13 g/dL (ref 13.0–17.0)
Immature Granulocytes: 0 %
Lymphocytes Relative: 12 %
Lymphs Abs: 1.2 10*3/uL (ref 0.7–4.0)
MCH: 29.1 pg (ref 26.0–34.0)
MCHC: 33.5 g/dL (ref 30.0–36.0)
MCV: 87 fL (ref 80.0–100.0)
Monocytes Absolute: 0.7 10*3/uL (ref 0.1–1.0)
Monocytes Relative: 7 %
Neutro Abs: 7.7 10*3/uL (ref 1.7–7.7)
Neutrophils Relative %: 78 %
Platelet Count: 262 10*3/uL (ref 150–400)
RBC: 4.46 MIL/uL (ref 4.22–5.81)
RDW: 14.5 % (ref 11.5–15.5)
WBC Count: 10 10*3/uL (ref 4.0–10.5)
nRBC: 0 % (ref 0.0–0.2)

## 2022-08-19 MED ORDER — BORTEZOMIB CHEMO SQ INJECTION 3.5 MG (2.5MG/ML)
1.3000 mg/m2 | Freq: Once | INTRAMUSCULAR | Status: AC
  Administered 2022-08-19: 2.75 mg via SUBCUTANEOUS
  Filled 2022-08-19: qty 1.1

## 2022-08-19 NOTE — Patient Instructions (Signed)
[facility]  Discharge Instructions: Thank you for choosing [facility] to provide your oncology and hematology care.   If you have a lab appointment with the Cancer Center, please go directly to the Cancer Center and check in at the registration area.  Wear comfortable clothing and clothing appropriate for easy access to any Portacath or PICC line.   We strive to give you quality time with your provider. You may need to reschedule your appointment if you arrive late (15 or more minutes).  Arriving late affects you and other patients whose appointments are after yours.  Also, if you miss three or more appointments without notifying the office, you may be dismissed from the clinic at the provider's discretion.      For prescription refill requests, have your pharmacy contact our office and allow 72 hours for refills to be completed.    Today you received the following chemotherapy and/or immunotherapy agents velcade    To help prevent nausea and vomiting after your treatment, we encourage you to take your nausea medication as directed.  BELOW ARE SYMPTOMS THAT SHOULD BE REPORTED IMMEDIATELY: *FEVER GREATER THAN 100.4 F (38 C) OR HIGHER *CHILLS OR SWEATING *NAUSEA AND VOMITING THAT IS NOT CONTROLLED WITH YOUR NAUSEA MEDICATION *UNUSUAL SHORTNESS OF BREATH *UNUSUAL BRUISING OR BLEEDING *URINARY PROBLEMS (pain or burning when urinating, or frequent urination) *BOWEL PROBLEMS (unusual diarrhea, constipation, pain near the anus) TENDERNESS IN MOUTH AND THROAT WITH OR WITHOUT PRESENCE OF ULCERS (sore throat, sores in mouth, or a toothache) UNUSUAL RASH, SWELLING OR PAIN  UNUSUAL VAGINAL DISCHARGE OR ITCHING   Items with * indicate a potential emergency and should be followed up as soon as possible or go to the Emergency Department if any problems should occur.  Please show the CHEMOTHERAPY ALERT CARD or IMMUNOTHERAPY ALERT CARD at check-in to the  Emergency Department and triage nurse. Should you have questions after your visit or need to cancel or reschedule your appointment, please contact [facility]  [phone] and follow the prompts.  Office hours are 8:00 a.m. to 4:30 p.m. Monday - Friday. Please note that voicemails left after 4:00 p.m. may not be returned until the following business day.  We are closed weekends and major holidays. You have access to a nurse at all times for urgent questions. Please call the main number to the clinic [phone] and follow the prompts.  For any non-urgent questions, you may also contact your provider using MyChart. We now offer e-Visits for anyone 18 and older to request care online for non-urgent symptoms. For details visit [email or web address].   Also download the MyChart app! Go to the app store, search "MyChart", open the app, select [facility], and log in with your MyChart username and password.  Masks are optional in the cancer centers. If you would like for your care team to wear a mask while they are taking care of you, please let them know. You may have one support person who is at least 66 years old accompany you for your appointments. 

## 2022-08-19 NOTE — Progress Notes (Signed)
Patient labs have resulted and Dr Marin Olp would like to proceed with a bone marrow biopsy. Order placed.   Oncology Nurse Navigator Documentation     08/19/2022    9:00 AM  Oncology Nurse Navigator Flowsheets  Navigator Follow Up Date: 09/16/2022  Navigator Follow Up Reason: Follow-up Appointment;Chemotherapy  Navigator Location [facility]  Navigator Encounter Type Appt/Treatment Plan Review  Patient Visit Type MedOnc  Treatment Phase Active Tx  Barriers/Navigation Needs Coordination of Care;Education  Interventions Other  Acuity Level 2-Minimal Needs (1-2 Barriers Identified)  Support Groups/Services Friends and Family  Time Spent with Patient 15

## 2022-08-23 LAB — UPEP/UIFE/LIGHT CHAINS/TP, 24-HR UR
% BETA, Urine: 9.7 %
ALPHA 1 URINE: 1.9 %
Albumin, U: 82.1 %
Alpha 2, Urine: 3.9 %
Free Kappa Lt Chains,Ur: 33.1 mg/L (ref 1.17–86.46)
Free Kappa/Lambda Ratio: 8.92 (ref 1.83–14.26)
Free Lambda Lt Chains,Ur: 3.71 mg/L (ref 0.27–15.21)
GAMMA GLOBULIN URINE: 2.4 %
Total Protein, Urine-Ur/day: 766 mg/24 hr — ABNORMAL HIGH (ref 30–150)
Total Protein, Urine: 40.3 mg/dL
Total Volume: 1900

## 2022-08-26 ENCOUNTER — Other Ambulatory Visit: Payer: Self-pay | Admitting: Radiology

## 2022-08-26 ENCOUNTER — Other Ambulatory Visit: Payer: Self-pay | Admitting: *Deleted

## 2022-08-26 ENCOUNTER — Inpatient Hospital Stay: Payer: Medicare Other

## 2022-08-26 VITALS — BP 141/60 | HR 61 | Temp 97.8°F | Resp 18

## 2022-08-26 DIAGNOSIS — C9000 Multiple myeloma not having achieved remission: Secondary | ICD-10-CM

## 2022-08-26 DIAGNOSIS — Z5112 Encounter for antineoplastic immunotherapy: Secondary | ICD-10-CM | POA: Diagnosis not present

## 2022-08-26 LAB — CMP (CANCER CENTER ONLY)
ALT: 9 U/L (ref 0–44)
AST: 9 U/L — ABNORMAL LOW (ref 15–41)
Albumin: 4.2 g/dL (ref 3.5–5.0)
Alkaline Phosphatase: 51 U/L (ref 38–126)
Anion gap: 8 (ref 5–15)
BUN: 14 mg/dL (ref 8–23)
CO2: 27 mmol/L (ref 22–32)
Calcium: 9.7 mg/dL (ref 8.9–10.3)
Chloride: 106 mmol/L (ref 98–111)
Creatinine: 1.32 mg/dL — ABNORMAL HIGH (ref 0.61–1.24)
GFR, Estimated: 59 mL/min — ABNORMAL LOW (ref >60)
Glucose, Bld: 125 mg/dL — ABNORMAL HIGH (ref 70–99)
Potassium: 5.5 mmol/L — ABNORMAL HIGH (ref 3.5–5.1)
Sodium: 141 mmol/L (ref 135–145)
Total Bilirubin: 0.5 mg/dL (ref 0.3–1.2)
Total Protein: 6.6 g/dL (ref 6.5–8.1)

## 2022-08-26 LAB — CBC WITH DIFFERENTIAL (CANCER CENTER ONLY)
Abs Immature Granulocytes: 0.05 10*3/uL (ref 0.00–0.07)
Basophils Absolute: 0 10*3/uL (ref 0.0–0.1)
Basophils Relative: 0 %
Eosinophils Absolute: 0.2 10*3/uL (ref 0.0–0.5)
Eosinophils Relative: 1 %
HCT: 40.6 % (ref 39.0–52.0)
Hemoglobin: 13.4 g/dL (ref 13.0–17.0)
Immature Granulocytes: 0 %
Lymphocytes Relative: 9 %
Lymphs Abs: 1.2 10*3/uL (ref 0.7–4.0)
MCH: 29 pg (ref 26.0–34.0)
MCHC: 33 g/dL (ref 30.0–36.0)
MCV: 87.9 fL (ref 80.0–100.0)
Monocytes Absolute: 0.8 10*3/uL (ref 0.1–1.0)
Monocytes Relative: 6 %
Neutro Abs: 11 10*3/uL — ABNORMAL HIGH (ref 1.7–7.7)
Neutrophils Relative %: 84 %
Platelet Count: 238 10*3/uL (ref 150–400)
RBC: 4.62 MIL/uL (ref 4.22–5.81)
RDW: 14.5 % (ref 11.5–15.5)
WBC Count: 13.2 10*3/uL — ABNORMAL HIGH (ref 4.0–10.5)
nRBC: 0 % (ref 0.0–0.2)

## 2022-08-26 MED ORDER — DIPHENHYDRAMINE HCL 25 MG PO CAPS
50.0000 mg | ORAL_CAPSULE | Freq: Once | ORAL | Status: AC
  Administered 2022-08-26: 50 mg via ORAL
  Filled 2022-08-26: qty 2

## 2022-08-26 MED ORDER — DARATUMUMAB-HYALURONIDASE-FIHJ 1800-30000 MG-UT/15ML ~~LOC~~ SOLN
1800.0000 mg | Freq: Once | SUBCUTANEOUS | Status: AC
  Administered 2022-08-26: 1800 mg via SUBCUTANEOUS
  Filled 2022-08-26: qty 15

## 2022-08-26 MED ORDER — ACETAMINOPHEN 325 MG PO TABS
650.0000 mg | ORAL_TABLET | Freq: Once | ORAL | Status: AC
  Administered 2022-08-26: 650 mg via ORAL
  Filled 2022-08-26: qty 2

## 2022-08-26 MED ORDER — BORTEZOMIB CHEMO SQ INJECTION 3.5 MG (2.5MG/ML)
1.3000 mg/m2 | Freq: Once | INTRAMUSCULAR | Status: AC
  Administered 2022-08-26: 2.75 mg via SUBCUTANEOUS
  Filled 2022-08-26: qty 1.1

## 2022-08-26 NOTE — Progress Notes (Signed)
Potassium is 5.5 today.  I spoke with patient and told him to hydrate for the next few days per Dr. Marin Olp.  No other treatment, just drink fluids.

## 2022-08-26 NOTE — Patient Instructions (Signed)
Daratumumab; Hyaluronidase Injection What is this medication? DARATUMUMAB; HYALURONIDASE (dar a toom ue mab; hye al ur ON i dase) treats multiple myeloma, a type of bone marrow cancer. Daratumumab works by blocking a protein that causes cancer cells to grow and multiply. This helps to slow or stop the spread of cancer cells. Hyaluronidase works by increasing the absorption of other medications in the body to help them work better. This medication may also be used treat amyloidosis, a condition that causes the buildup of a protein (amyloid) in your body. It works by reducing the buildup of this protein, which decreases symptoms. It is a combination medication that contains a monoclonal antibody. This medicine may be used for other purposes; ask your health care provider or pharmacist if you have questions. COMMON BRAND NAME(S): DARZALEX FASPRO What should I tell my care team before I take this medication? They need to know if you have any of these conditions: Heart disease Infection, such as chickenpox, cold sores, herpes, hepatitis B Lung or breathing disease An unusual or allergic reaction to daratumumab, hyaluronidase, other medications, foods, dyes, or preservatives Pregnant or trying to get pregnant Breast-feeding How should I use this medication? This medication is injected under the skin. It is given by your care team in a hospital or clinic setting. Talk to your care team about the use of this medication in children. Special care may be needed. Overdosage: If you think you have taken too much of this medicine contact a poison control center or emergency room at once. NOTE: This medicine is only for you. Do not share this medicine with others. What if I miss a dose? Keep appointments for follow-up doses. It is important not to miss your dose. Call your care team if you are unable to keep an appointment. What may interact with this medication? Interactions have not been studied. This list  may not describe all possible interactions. Give your health care provider a list of all the medicines, herbs, non-prescription drugs, or dietary supplements you use. Also tell them if you smoke, drink alcohol, or use illegal drugs. Some items may interact with your medicine. What should I watch for while using this medication? Your condition will be monitored carefully while you are receiving this medication. This medication can cause serious allergic reactions. To reduce your risk, your care team may give you other medication to take before receiving this one. Be sure to follow the directions from your care team. This medication can affect the results of blood tests to match your blood type. These changes can last for up to 6 months after the final dose. Your care team will do blood tests to match your blood type before you start treatment. Tell all of your care team that you are being treated with this medication before receiving a blood transfusion. This medication can affect the results of some tests used to determine treatment response; extra tests may be needed to evaluate response. Talk to your care team if you wish to become pregnant or think you are pregnant. This medication can cause serious birth defects if taken during pregnancy and for 3 months after the last dose. A reliable form of contraception is recommended while taking this medication and for 3 months after the last dose. Talk to your care team about effective forms of contraception. Do not breast-feed while taking this medication. What side effects may I notice from receiving this medication? Side effects that you should report to your care team as soon as   possible: Allergic reactions--skin rash, itching, hives, swelling of the face, lips, tongue, or throat Heart rhythm changes--fast or irregular heartbeat, dizziness, feeling faint or lightheaded, chest pain, trouble breathing Infection--fever, chills, cough, sore throat, wounds that  don't heal, pain or trouble when passing urine, general feeling of discomfort or being unwell Infusion reactions--chest pain, shortness of breath or trouble breathing, feeling faint or lightheaded Sudden eye pain or change in vision such as blurry vision, seeing halos around lights, vision loss Unusual bruising or bleeding Side effects that usually do not require medical attention (report to your care team if they continue or are bothersome): Constipation Diarrhea Fatigue Nausea Pain, tingling, or numbness in the hands or feet Swelling of the ankles, hands, or feet This list may not describe all possible side effects. Call your doctor for medical advice about side effects. You may report side effects to FDA at 1-800-FDA-1088. Where should I keep my medication? This medication is given in a hospital or clinic. It will not be stored at home. NOTE: This sheet is a summary. It may not cover all possible information. If you have questions about this medicine, talk to your doctor, pharmacist, or health care provider.  2023 Elsevier/Gold Standard (2022-02-24 00:00:00)  

## 2022-08-27 NOTE — H&P (Signed)
Chief Complaint: Patient was seen in consultation today for IgA kappa myeloma  Referring Physician(s): Ennever,Peter R  Supervising Physician: Corrie Mckusick  Patient Status: [facility] - Out-pt  History of Present Illness: Barry Gray is a 66 y.o. male with a past medical history significant for DM and multiple myeloma who presents today for bone marrow aspiration/biopsy. Barry Gray was first diagnosed with IgA kappa myeloma in June of this year after he was seen by orthopedic surgery for neck pain. He underwent cervical MRI 04/02/22 for evaluation of neck pain and was found to have a C7 burst fracture, possibly pathologic. 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 He underwent anterior cervical decompression and fusion C5/6, C7-T1, complex C7 corpectomy, posterior spinal fusion C5-6, C6-7, C7-T1, T1-T2 with Dr. Lynann Bologna on 04/28/22, pathology of the cervical tumor showed kappa restricted plasma cell neoplasm. He was referred to oncology and started on systemic therapy. He eventually underwent bone marrow biopsy 06/15/22 in IR, pathology showed hypercellular bone marrow for age with trilineage hematopoiesis and 1% plasma cells. He has continued on systemic therapy and IR has been consulted for repeat bone marrow biopsy to assess treatment response.   Past Medical History:  Diagnosis Date   Diabetes mellitus    Fracture of fibula, distal 10/2011   w/ mild posterior displacement of distal fracture fragments   History of radiation therapy    C-Spine 06/17/22-0815/23-Dr. Gery Pray   Kidney stone    Multiple myeloma ([place]) 05/07/2022    Past Surgical History:  Procedure Laterality Date   ANTERIOR CERVICAL DECOMPRESSION/DISCECTOMY FUSION 4 LEVELS N/A 04/28/2022   Procedure: ANTERIOR CERVICAL DECOMPRESSION FUSION CERVICAL 5- CERVICAL 6, CERVICAL 6- CERVICAL 7, CERVICAL 7 - THORACIC 1 , Thoracic 1 - thoracic 2  WITH CERVICAL 7 CORPECTOMY WITH INSTRUMENTATION AND ALLOGRAFT;  Surgeon: Phylliss Bob, MD;  Location: [facility];  Service: Orthopedics;  Laterality: N/A;   COLONOSCOPY     POSTERIOR CERVICAL FUSION/FORAMINOTOMY  04/28/2022   Procedure: POSTERIOR CERVICAL FUSION/FORAMINOTOMY LEVEL 3;  Surgeon: Phylliss Bob, MD;  Location: [facility];  Service: Orthopedics;;    Allergies: Patient has no known allergies.  Medications: Prior to Admission medications   Medication Sig Start Date End Date Taking? Authorizing Provider  aspirin 325 MG tablet Take 325 mg by mouth daily.    [provider]  dexamethasone (DECADRON) 4 MG tablet Take 5 tablets (20 mg total) by mouth once a week. Start on 05/20/2022 08/10/22   Volanda Napoleon, MD  famciclovir (FAMVIR) 250 MG tablet Take 1 tablet (250 mg total) by mouth daily. 05/07/22   Volanda Napoleon, MD  glipiZIDE (GLUCOTROL) 5 MG tablet Take by mouth 2 (two) times daily before a meal.    [provider]  lactulose (CHRONULAC) 10 GM/15ML solution TAKE 20 MLS BY MOUTH EVERY 8 HOURS AS NEEDED 08/16/22   Volanda Napoleon, MD  lisinopril (ZESTRIL) 10 MG tablet Take 10 mg by mouth daily as needed (blood pressure of 160/85). Patient not taking: Reported on 06/02/2022 04/16/22   [provider]  LORazepam (ATIVAN) 0.5 MG tablet Take 1 tablet (0.5 mg total) by mouth every 6 (six) hours as needed (Nausea or vomiting). Patient not taking: Reported on 05/24/2022 05/07/22   Volanda Napoleon, MD  metFORMIN (GLUCOPHAGE) 500 MG tablet Take 500 mg by mouth 2 (two) times daily with a meal.    [provider]  ondansetron (ZOFRAN) 8 MG tablet Take 1 tablet (8 mg total) by mouth 2 (two) times daily as needed (Nausea or vomiting). Patient not taking: Reported on 05/24/2022 05/07/22   Volanda Napoleon, MD  tamsulosin (FLOMAX) 0.4 MG CAPS capsule Take 0.4 mg by mouth daily. 05/06/22   [provider]     Family History  Problem Relation Age of Onset    Cancer Mother     Social History   Socioeconomic History   Marital status: Married    Spouse name: Not on file   Number of children: Not on file   Years of education: Not on file   Highest education level: Not on file  Occupational History   Not on file  Tobacco Use   Smoking status: Former    Packs/day: 1.00    Years: 15.00    Total pack years: 15.00    Types: Cigarettes    Quit date: 83    Years since quitting: 33.8   Smokeless tobacco: Never  Vaping Use   Vaping Use: Never used  Substance and Sexual Activity   Alcohol use: No   Drug use: No   Sexual activity: Not Currently  Other Topics Concern   Not on file  Social History Narrative   Not on file   Social Determinants of Health   Financial Resource Strain: Not on file  Food Insecurity: Not on file  Transportation Needs: No Transportation Needs (05/24/2022)   PRAPARE - Hydrologist (Medical): No    Lack of Transportation (Non-Medical): No  Physical Activity: Inactive (05/24/2022)   Exercise Vital Sign    Days of Exercise per Week: 0 days    Minutes of Exercise per Session: 0 min  Stress: Not on file  Social Connections: Not on file     Review of Systems: A 12 point ROS discussed and pertinent positives are indicated in the HPI above.  All other systems are negative.  Review of Systems  Constitutional:  Negative for chills and fever.  Respiratory:  Negative for cough and shortness of breath.   Cardiovascular:  Negative for chest pain.  Gastrointestinal:  Negative for abdominal pain, diarrhea, nausea and vomiting.  Musculoskeletal:  Negative for back pain.  Neurological:  Negative for dizziness and headaches.    Vital Signs: There were no vitals taken for this visit.  Physical Exam Vitals reviewed.  Constitutional:      General: He is not in acute distress. HENT:     Head: Normocephalic.     Mouth/Throat:     Mouth: Mucous membranes are moist.     Pharynx: Oropharynx  is clear. No oropharyngeal exudate or posterior oropharyngeal erythema.  Cardiovascular:     Rate and Rhythm: Normal rate and regular rhythm.  Pulmonary:     Effort: Pulmonary effort is normal.     Breath sounds: Normal breath sounds.  Abdominal:     General: There is no distension.     Palpations: Abdomen is soft.     Tenderness: There is no abdominal tenderness.  Skin:    General: Skin is warm and dry.  Neurological:     Mental Status: He is alert and oriented to person, place, and time.  Psychiatric:        Mood and Affect: Mood normal.        Thought Content: Thought content normal.        Judgment: Judgment normal.  Imaging: No results found.  Labs:  CBC: Recent Labs    07/29/22 0912 08/12/22 0743 08/19/22 0854 08/26/22 0858  WBC 7.9 9.7 10.0 13.2*  HGB 12.0* 12.7* 13.0 13.4  HCT 36.1* 38.6* 38.8* 40.6  PLT 225 263 262 238    COAGS: No results for input(s): "INR", "APTT" in the last 8760 hours.  BMP: Recent Labs    07/29/22 0912 08/12/22 0743 08/19/22 0854 08/26/22 0858  NA 142 140 139 141  K 4.0 4.6 4.5 5.5*  CL 107 107 105 106  CO2 _0 GLUCOSE 103* 113* 119* 125*  BUN _1 CALCIUM 9.2 9.2 9.7 9.7  CREATININE 1.12 1.18 1.18 1.32*  GFRNONAA >60 >60 >60 59*    LIVER FUNCTION TESTS: Recent Labs    07/29/22 0912 08/12/22 0743 08/19/22 0854 08/26/22 0858  BILITOT 0.5 0.4 0.5 0.5  AST 9* 10* 9* 9*  ALT _2 ALKPHOS 54 52 54 51  PROT 6.4* 6.7 6.6 6.6  ALBUMIN 3.9 4.2 4.2 4.2    TUMOR MARKERS: No results for input(s): "AFPTM", "CEA", "CA199", "CHROMGRNA" in the last 8760 hours.  Assessment and Plan:  66 y/o M with history of IgA kappa myeloma currently undergoing systemic therapy who presents today for repeat bone marrow biopsy to assess for treatment response. Initial bone marrow biopsy 06/15/22 in IR (Dr. Annamaria Boots).  Risks and benefits of bone marrow aspiration/biopsy was discussed with the patient and/or  patient's family including, but not limited to bleeding, infection, damage to adjacent structures or low yield requiring additional tests.  All of the questions were answered and there is agreement to proceed.  Consent signed and in chart.   Thank you for this interesting consult.  I greatly enjoyed meeting Barry Gray and look forward to participating in their care.  A copy of this report was sent to the requesting provider on this date.  Electronically Signed: Joaquim Nam, PA-C 08/27/2022, 10:08 AM   I spent a total of {New IPJA:[id number]} {New Out-Pt:[id number]}  {Established Out-Pt:[id number]} in face to face in clinical consultation, greater than 50% of which was counseling/coordinating care for ***

## 2022-08-30 ENCOUNTER — Ambulatory Visit (HOSPITAL_COMMUNITY)
Admission: RE | Admit: 2022-08-30 | Discharge: 2022-08-30 | Disposition: A | Discharge status: Home / Self Care | Payer: Medicare Other | Source: Ambulatory Visit | Attending: Hematology & Oncology | Admitting: Hematology & Oncology

## 2022-08-30 ENCOUNTER — Other Ambulatory Visit: Payer: Self-pay

## 2022-08-30 ENCOUNTER — Encounter (HOSPITAL_COMMUNITY): Payer: Self-pay

## 2022-08-30 DIAGNOSIS — D649 Anemia, unspecified: Secondary | ICD-10-CM | POA: Insufficient documentation

## 2022-08-30 DIAGNOSIS — Z1379 Encounter for other screening for genetic and chromosomal anomalies: Secondary | ICD-10-CM | POA: Diagnosis not present

## 2022-08-30 DIAGNOSIS — C9000 Multiple myeloma not having achieved remission: Secondary | ICD-10-CM | POA: Diagnosis present

## 2022-08-30 LAB — CBC WITH DIFFERENTIAL/PLATELET
Abs Immature Granulocytes: 0.04 10*3/uL (ref 0.00–0.07)
Basophils Absolute: 0 10*3/uL (ref 0.0–0.1)
Basophils Relative: 1 %
Eosinophils Absolute: 0.1 10*3/uL (ref 0.0–0.5)
Eosinophils Relative: 1 %
HCT: 39.8 % (ref 39.0–52.0)
Hemoglobin: 12.9 g/dL — ABNORMAL LOW (ref 13.0–17.0)
Immature Granulocytes: 1 %
Lymphocytes Relative: 16 %
Lymphs Abs: 1.4 10*3/uL (ref 0.7–4.0)
MCH: 28.9 pg (ref 26.0–34.0)
MCHC: 32.4 g/dL (ref 30.0–36.0)
MCV: 89.2 fL (ref 80.0–100.0)
Monocytes Absolute: 1.2 10*3/uL — ABNORMAL HIGH (ref 0.1–1.0)
Monocytes Relative: 13 %
Neutro Abs: 6 10*3/uL (ref 1.7–7.7)
Neutrophils Relative %: 68 %
Platelets: 209 10*3/uL (ref 150–400)
RBC: 4.46 MIL/uL (ref 4.22–5.81)
RDW: 14.7 % (ref 11.5–15.5)
WBC: 8.7 10*3/uL (ref 4.0–10.5)
nRBC: 0 % (ref 0.0–0.2)

## 2022-08-30 LAB — GLUCOSE, CAPILLARY: Glucose-Capillary: 152 mg/dL — ABNORMAL HIGH (ref 70–99)

## 2022-08-30 MED ORDER — MIDAZOLAM HCL 2 MG/2ML IJ SOLN
INTRAMUSCULAR | Status: AC
  Filled 2022-08-30: qty 4

## 2022-08-30 MED ORDER — FENTANYL CITRATE (PF) 100 MCG/2ML IJ SOLN
INTRAMUSCULAR | Status: AC | PRN
  Administered 2022-08-30 (×4): 50 ug via INTRAVENOUS

## 2022-08-30 MED ORDER — FENTANYL CITRATE (PF) 100 MCG/2ML IJ SOLN
INTRAMUSCULAR | Status: AC
  Filled 2022-08-30: qty 4

## 2022-08-30 MED ORDER — SODIUM CHLORIDE 0.9 % IV SOLN: INTRAVENOUS | Status: DC

## 2022-08-30 MED ORDER — MIDAZOLAM HCL 2 MG/2ML IJ SOLN
INTRAMUSCULAR | Status: AC | PRN
  Administered 2022-08-30 (×2): 1 mg via INTRAVENOUS

## 2022-08-30 NOTE — Discharge Instructions (Signed)
Discharge Instructions:   Please call Interventional Radiology clinic [phone] with any questions or concerns.    You may remove your dressing and shower tomorrow.  Do not submerge in water.   Bone Marrow Aspiration and Bone Marrow Biopsy, Adult, Care After This sheet gives you information about how to care for yourself after your procedure. Your health care provider may also give you more specific instructions. If you have problems or questions, contact your health care provider. What can I expect after the procedure? After the procedure, it is common to have: Mild pain and tenderness. Swelling. Bruising. Follow these instructions at home: Puncture site care  Follow instructions from your health care provider about how to take care of the puncture site. Make sure you: Wash your hands with soap and water before and after you change your bandage (dressing). If soap and water are not available, use hand sanitizer. Change your dressing as told by your health care provider. Check your puncture site every day for signs of infection. Check for: More redness, swelling, or pain. Fluid or blood. Warmth. Pus or a bad smell. Activity Return to your normal activities as told by your health care provider. Ask your health care provider what activities are safe for you. Do not lift anything that is heavier than 10 lb (4.5 kg), or the limit that you are told, until your health care provider says that it is safe. Do not drive for 24 hours if you were given a sedative during your procedure. General instructions  Take over-the-counter and prescription medicines only as told by your health care provider. Do not take baths, swim, or use a hot tub until your health care provider approves. Ask your health care provider if you may take showers. You may only be allowed to take sponge baths. If directed, put ice on the affected area. To do this: Put ice in a plastic bag. Place a towel between your skin  and the bag. Leave the ice on for 20 minutes, 2-3 times a day. Keep all follow-up visits as told by your health care provider. This is important. Contact a health care provider if: Your pain is not controlled with medicine. You have a fever. You have more redness, swelling, or pain around the puncture site. You have fluid or blood coming from the puncture site. Your puncture site feels warm to the touch. You have pus or a bad smell coming from the puncture site. Summary After the procedure, it is common to have mild pain, tenderness, swelling, and bruising. Follow instructions from your health care provider about how to take care of the puncture site and what activities are safe for you. Take over-the-counter and prescription medicines only as told by your health care provider. Contact a health care provider if you have any signs of infection, such as fluid or blood coming from the puncture site. This information is not intended to replace advice given to you by your health care provider. Make sure you discuss any questions you have with your health care provider. Document Revised: 03/20/2019 Document Reviewed: 03/20/2019 Elsevier Patient Education  2023 Elsevier Inc.    Moderate Conscious Sedation, Adult, Care After This sheet gives you information about how to care for yourself after your procedure. Your health care provider may also give you more specific instructions. If you have problems or questions, contact your health care provider. What can I expect after the procedure? After the procedure, it is common to have: Sleepiness for several hours. Impaired judgment   for several hours. Difficulty with balance. Vomiting if you eat too soon. Follow these instructions at home: For the time period you were told by your health care provider: Rest. Do not participate in activities where you could fall or become injured. Do not drive or use machinery. Do not drink alcohol. Do not take  sleeping pills or medicines that cause drowsiness. Do not make important decisions or sign legal documents. Do not take care of children on your own. Eating and drinking  Follow the diet recommended by your health care provider. Drink enough fluid to keep your urine pale yellow. If you vomit: Drink water, juice, or soup when you can drink without vomiting. Make sure you have little or no nausea before eating solid foods. General instructions Take over-the-counter and prescription medicines only as told by your health care provider. Have a responsible adult stay with you for the time you are told. It is important to have someone help care for you until you are awake and alert. Do not smoke. Keep all follow-up visits as told by your health care provider. This is important. Contact a health care provider if: You are still sleepy or having trouble with balance after 24 hours. You feel light-headed. You keep feeling nauseous or you keep vomiting. You develop a rash. You have a fever. You have redness or swelling around the IV site. Get help right away if: You have trouble breathing. You have new-onset confusion at home. Summary After the procedure, it is common to feel sleepy, have impaired judgment, or feel nauseous if you eat too soon. Rest after you get home. Know the things you should not do after the procedure. Follow the diet recommended by your health care provider and drink enough fluid to keep your urine pale yellow. Get help right away if you have trouble breathing or new-onset confusion at home. This information is not intended to replace advice given to you by your health care provider. Make sure you discuss any questions you have with your health care provider. Document Revised: 02/29/2020 Document Reviewed: 09/27/2019 Elsevier Patient Education  2023 Elsevier Inc.    

## 2022-08-30 NOTE — Procedures (Signed)
Interventional Radiology Procedure Note  Procedure: CT guided aspirate and core biopsy of right posterior iliac bone Complications: None Recommendations: - Bedrest supine x 1 hrs - OTC's PRN  Pain - Follow biopsy results  Signed,  Tianne Plott S. Aide Wojnar, DO    

## 2022-09-01 LAB — SURGICAL PATHOLOGY

## 2022-09-05 ENCOUNTER — Other Ambulatory Visit: Payer: Self-pay | Admitting: Hematology & Oncology

## 2022-09-06 ENCOUNTER — Encounter: Payer: Self-pay | Admitting: Hematology & Oncology

## 2022-09-06 ENCOUNTER — Encounter (HOSPITAL_COMMUNITY): Payer: Self-pay | Admitting: Hematology & Oncology

## 2022-09-08 ENCOUNTER — Encounter (HOSPITAL_COMMUNITY): Payer: Self-pay | Admitting: Hematology & Oncology

## 2022-09-15 ENCOUNTER — Other Ambulatory Visit: Payer: Self-pay | Admitting: *Deleted

## 2022-09-15 DIAGNOSIS — C9000 Multiple myeloma not having achieved remission: Secondary | ICD-10-CM

## 2022-09-16 ENCOUNTER — Other Ambulatory Visit: Payer: Self-pay | Admitting: *Deleted

## 2022-09-16 ENCOUNTER — Inpatient Hospital Stay (HOSPITAL_BASED_OUTPATIENT_CLINIC_OR_DEPARTMENT_OTHER): Payer: Medicare Other | Admitting: Hematology & Oncology

## 2022-09-16 ENCOUNTER — Inpatient Hospital Stay: Payer: Medicare Other

## 2022-09-16 ENCOUNTER — Encounter: Payer: Self-pay | Admitting: Hematology & Oncology

## 2022-09-16 ENCOUNTER — Encounter: Payer: Self-pay | Admitting: *Deleted

## 2022-09-16 ENCOUNTER — Inpatient Hospital Stay: Payer: Medicare Other | Attending: Hematology & Oncology

## 2022-09-16 VITALS — BP 139/79 | HR 72 | Temp 97.6°F | Resp 20 | Ht 70.0 in | Wt 196.0 lb

## 2022-09-16 DIAGNOSIS — C9000 Multiple myeloma not having achieved remission: Secondary | ICD-10-CM | POA: Insufficient documentation

## 2022-09-16 DIAGNOSIS — Z79899 Other long term (current) drug therapy: Secondary | ICD-10-CM | POA: Diagnosis not present

## 2022-09-16 DIAGNOSIS — Z5112 Encounter for antineoplastic immunotherapy: Secondary | ICD-10-CM | POA: Diagnosis not present

## 2022-09-16 LAB — CBC WITH DIFFERENTIAL (CANCER CENTER ONLY)
Abs Immature Granulocytes: 0.02 10*3/uL (ref 0.00–0.07)
Basophils Absolute: 0 10*3/uL (ref 0.0–0.1)
Basophils Relative: 1 %
Eosinophils Absolute: 0.1 10*3/uL (ref 0.0–0.5)
Eosinophils Relative: 1 %
HCT: 39 % (ref 39.0–52.0)
Hemoglobin: 13 g/dL (ref 13.0–17.0)
Immature Granulocytes: 0 %
Lymphocytes Relative: 10 %
Lymphs Abs: 0.9 10*3/uL (ref 0.7–4.0)
MCH: 29.2 pg (ref 26.0–34.0)
MCHC: 33.3 g/dL (ref 30.0–36.0)
MCV: 87.6 fL (ref 80.0–100.0)
Monocytes Absolute: 0.7 10*3/uL (ref 0.1–1.0)
Monocytes Relative: 8 %
Neutro Abs: 6.9 10*3/uL (ref 1.7–7.7)
Neutrophils Relative %: 80 %
Platelet Count: 256 10*3/uL (ref 150–400)
RBC: 4.45 MIL/uL (ref 4.22–5.81)
RDW: 14.1 % (ref 11.5–15.5)
WBC Count: 8.7 10*3/uL (ref 4.0–10.5)
nRBC: 0 % (ref 0.0–0.2)

## 2022-09-16 LAB — CMP (CANCER CENTER ONLY)
ALT: 8 U/L (ref 0–44)
AST: 10 U/L — ABNORMAL LOW (ref 15–41)
Albumin: 4.3 g/dL (ref 3.5–5.0)
Alkaline Phosphatase: 52 U/L (ref 38–126)
Anion gap: 11 (ref 5–15)
BUN: 9 mg/dL (ref 8–23)
CO2: 25 mmol/L (ref 22–32)
Calcium: 9.4 mg/dL (ref 8.9–10.3)
Chloride: 106 mmol/L (ref 98–111)
Creatinine: 1.21 mg/dL (ref 0.61–1.24)
GFR, Estimated: >60 mL/min (ref >60)
Glucose, Bld: 125 mg/dL — ABNORMAL HIGH (ref 70–99)
Potassium: 4.2 mmol/L (ref 3.5–5.1)
Sodium: 142 mmol/L (ref 135–145)
Total Bilirubin: 0.6 mg/dL (ref 0.3–1.2)
Total Protein: 6.9 g/dL (ref 6.5–8.1)

## 2022-09-16 LAB — LACTATE DEHYDROGENASE: LDH: 127 U/L (ref 98–192)

## 2022-09-16 MED ORDER — LACTULOSE 10 GM/15ML PO SOLN: ORAL | 1 refills | Status: DC

## 2022-09-16 MED ORDER — DIPHENHYDRAMINE HCL 25 MG PO CAPS
50.0000 mg | ORAL_CAPSULE | Freq: Once | ORAL | Status: AC
  Administered 2022-09-16: 50 mg via ORAL
  Filled 2022-09-16: qty 2

## 2022-09-16 MED ORDER — DARATUMUMAB-HYALURONIDASE-FIHJ 1800-30000 MG-UT/15ML ~~LOC~~ SOLN
1800.0000 mg | Freq: Once | SUBCUTANEOUS | Status: AC
  Administered 2022-09-16: 1800 mg via SUBCUTANEOUS
  Filled 2022-09-16: qty 15

## 2022-09-16 MED ORDER — BORTEZOMIB CHEMO SQ INJECTION 3.5 MG (2.5MG/ML)
1.3000 mg/m2 | Freq: Once | INTRAMUSCULAR | Status: AC
  Administered 2022-09-16: 2.75 mg via SUBCUTANEOUS
  Filled 2022-09-16: qty 1.1

## 2022-09-16 MED ORDER — ACETAMINOPHEN 325 MG PO TABS
650.0000 mg | ORAL_TABLET | Freq: Once | ORAL | Status: AC
  Administered 2022-09-16: 650 mg via ORAL
  Filled 2022-09-16: qty 2

## 2022-09-16 NOTE — Patient Instructions (Signed)
[facility]  Discharge Instructions: Thank you for choosing [facility] to provide your oncology and hematology care.   If you have a lab appointment with the [facility], please go directly to the [facility] and check in at the registration area.  Wear comfortable clothing and clothing appropriate for easy access to any Portacath or PICC line.   We strive to give you quality time with your provider. You may need to reschedule your appointment if you arrive late (15 or more minutes).  Arriving late affects you and other patients whose appointments are after yours.  Also, if you miss three or more appointments without notifying the office, you may be dismissed from the clinic at the provider's discretion.      For prescription refill requests, have your pharmacy contact our office and allow 72 hours for refills to be completed.    Today you received the following chemotherapy and/or immunotherapy agents Velcade, Darzalex      To help prevent nausea and vomiting after your treatment, we encourage you to take your nausea medication as directed.  BELOW ARE SYMPTOMS THAT SHOULD BE REPORTED IMMEDIATELY: *FEVER GREATER THAN 100.4 F (38 C) OR HIGHER *CHILLS OR SWEATING *NAUSEA AND VOMITING THAT IS NOT CONTROLLED WITH YOUR NAUSEA MEDICATION *UNUSUAL SHORTNESS OF BREATH *UNUSUAL BRUISING OR BLEEDING *URINARY PROBLEMS (pain or burning when urinating, or frequent urination) *BOWEL PROBLEMS (unusual diarrhea, constipation, pain near the anus) TENDERNESS IN MOUTH AND THROAT WITH OR WITHOUT PRESENCE OF ULCERS (sore throat, sores in mouth, or a toothache) UNUSUAL RASH, SWELLING OR PAIN  UNUSUAL VAGINAL DISCHARGE OR ITCHING   Items with * indicate a potential emergency and should be followed up as soon as possible or go to the Emergency Department if any problems should occur.  Please show the CHEMOTHERAPY ALERT CARD or IMMUNOTHERAPY ALERT CARD at check-in  to the Emergency Department and triage nurse. Should you have questions after your visit or need to cancel or reschedule your appointment, please contact [facility]  [phone] and follow the prompts.  Office hours are 8:00 a.m. to 4:30 p.m. Monday - Friday. Please note that voicemails left after 4:00 p.m. may not be returned until the following business day.  We are closed weekends and major holidays. You have access to a nurse at all times for urgent questions. Please call the main number to the clinic [phone] and follow the prompts.  For any non-urgent questions, you may also contact your provider using MyChart. We now offer e-Visits for anyone 63 and older to request care online for non-urgent symptoms. For details visit [email or web address].   Also download the MyChart app! Go to the app store, search "MyChart", open the app, select [facility], and log in with your MyChart username and password.  Masks are optional in the cancer centers. If you would like for your care team to wear a mask while they are taking care of you, please let them know. You may have one support person who is at least 66 years old accompany you for your appointments.

## 2022-09-16 NOTE — Progress Notes (Signed)
Hematology and Oncology Follow Up Visit  Barry Gray [id number] 17-Mar-1956 66 y.o. 09/16/2022   Principle Diagnosis:  IgA Kappa myeloma -- normal cytogenetics/FISH (-)  Current Therapy:   S/p cervical spine stabilization -- 04/28/2022 Faspro/Velcade/Decadron -- s/p cycle #4 -- start on 05/20/2022 Zometa 4 mg IV q 3 months -- next dose on 10/2022     Interim History:  Barry Gray is back for follow-up.  He is doing incredibly well.  He did have a bone marrow biopsy done.  We did the bone marrow biopsy on 08/30/2022.  The pathology report [id number]) showed that he had 1% myeloma in the bone marrow.  I think the real key has been the urine.  His latest 24-hour urine does not show any light chain in the urine.  We will refer saw him, he had 5300 mg of Kappa light chain.  The most recent urine done on 08/19/2022 shows only 33 mg.  With his serum studies, his monoclonal spike is now down to 0.1 g/dL.  His IgA level is 45 mg/dL.  I am so happy for him.  Again, I think he is ready for transplant.  We will continue him on treatment.  He really has had no problems with treatment.  He has had no problems with nausea or vomiting.  He has had no change in bowel or bladder habits.  He is on lactulose.    His neck is doing so well right now.  He has seen Dr. Lynann Bologna.  Dr. Daryel Gerald Think is let him go.   Currently, I would say his performance status is ECOG 0.    Medications:  Current Outpatient Medications:    aspirin 325 MG tablet, Take 325 mg by mouth daily., Disp: , Rfl:    dexamethasone (DECADRON) 4 MG tablet, Take 5 tablets (20 mg total) by mouth once a week. Start on 05/20/2022, Disp: 100 tablet, Rfl: 3   famciclovir (FAMVIR) 250 MG tablet, Take 1 tablet (250 mg total) by mouth daily., Disp: 30 tablet, Rfl: 12   glipiZIDE (GLUCOTROL) 5 MG tablet, Take by mouth 2 (two) times daily before a meal., Disp: , Rfl:    lactulose (CHRONULAC) 10 GM/15ML solution, TAKE 20 MLS BY MOUTH EVERY  8 HOURS AS NEEDED, Disp: 473 mL, Rfl: 1   metFORMIN (GLUCOPHAGE) 500 MG tablet, Take 500 mg by mouth 2 (two) times daily with a meal., Disp: , Rfl:    tamsulosin (FLOMAX) 0.4 MG CAPS capsule, Take 0.4 mg by mouth daily., Disp: , Rfl:    lisinopril (ZESTRIL) 10 MG tablet, Take 10 mg by mouth daily as needed (blood pressure of 160/85). (Patient not taking: Reported on 06/02/2022), Disp: , Rfl:    LORazepam (ATIVAN) 0.5 MG tablet, Take 1 tablet (0.5 mg total) by mouth every 6 (six) hours as needed (Nausea or vomiting). (Patient not taking: Reported on 05/24/2022), Disp: 30 tablet, Rfl: 0   ondansetron (ZOFRAN) 8 MG tablet, Take 1 tablet (8 mg total) by mouth 2 (two) times daily as needed (Nausea or vomiting). (Patient not taking: Reported on 05/24/2022), Disp: 30 tablet, Rfl: 1  Allergies: No Known Allergies  Past Medical History, Surgical history, Social history, and Family History were reviewed and updated.  Review of Systems: Review of Systems  Constitutional: Negative.   HENT:  Negative.    Eyes: Negative.   Respiratory: Negative.    Cardiovascular: Negative.   Gastrointestinal: Negative.   Endocrine: Negative.   Genitourinary:  Positive for difficulty urinating. Negative for  bladder incontinence.   Musculoskeletal:  Positive for neck pain.  Skin: Negative.   Neurological: Negative.   Hematological: Negative.   Psychiatric/Behavioral: Negative.      Physical Exam:  height is _0  (1.778 m) and weight is 196 lb (88.9 kg). His oral temperature is 97.6 F (36.4 C). His blood pressure is 139/79 and his pulse is 72. His respiration is 20 and oxygen saturation is 99%.   Wt Readings from Last 3 Encounters:  09/16/22 196 lb (88.9 kg)  08/30/22 192 lb (87.1 kg)  08/12/22 192 lb 1.9 oz (87.1 kg)  His vital signs show temperature of 97.6.  Pulse 63.  Blood pressure 140/67.  Weight is 192 pounds.  Physical Exam Vitals reviewed.  HENT:     Head: Normocephalic and atraumatic.  Eyes:      Pupils: Pupils are equal, round, and reactive to light.  Neck:     Comments: His neck exam does show the healing cervical scar on the back of the neck.  This is a little bit firm.  He has no obvious adenopathy in the neck.  Cardiovascular:     Rate and Rhythm: Normal rate and regular rhythm.     Heart sounds: Normal heart sounds.  Pulmonary:     Effort: Pulmonary effort is normal.     Breath sounds: Normal breath sounds.  Abdominal:     General: Bowel sounds are normal.     Palpations: Abdomen is soft.  Musculoskeletal:        General: No tenderness or deformity. Normal range of motion.     Cervical back: Normal range of motion.  Lymphadenopathy:     Cervical: No cervical adenopathy.  Skin:    General: Skin is warm and dry.     Findings: No erythema or rash.  Neurological:     Mental Status: He is alert and oriented to person, place, and time.  Psychiatric:        Behavior: Behavior normal.        Thought Content: Thought content normal.        Judgment: Judgment normal.      Lab Results  Component Value Date   WBC 8.7 09/16/2022   HGB 13.0 09/16/2022   HCT 39.0 09/16/2022   MCV 87.6 09/16/2022   PLT 256 09/16/2022     Chemistry      Component Value Date/Time   NA 142 09/16/2022 0802   K 4.2 09/16/2022 0802   CL 106 09/16/2022 0802   CO2 25 09/16/2022 0802   BUN 9 09/16/2022 0802   CREATININE 1.21 09/16/2022 0802      Component Value Date/Time   CALCIUM 9.4 09/16/2022 0802   ALKPHOS 52 09/16/2022 0802   AST 10 (L) 09/16/2022 0802   ALT 8 09/16/2022 0802   BILITOT 0.6 09/16/2022 0802      Impression and Plan: Barry Gray is a very nice 66 year old white male.  He has IgA kappa myeloma.  Most of his myeloma is Kappa light chain.  Again, his light chains have come down incredibly well.  Again, I think the urine is the key.  I will speak with Dr. Laverta Baltimore at [facility].  We will see about getting him over for transplant consideration.  For right now, we will continue  him on the Faspro and Velcade.  Again he cannot tolerate the Revlimid.  Thankfully, I do not think we really need to have needed the Revlimid.  I know he will have a wonderful  Thanksgiving with his family.  We will plan to get him back in another month.   Volanda Napoleon, MD 11/2/20238:53 AM

## 2022-09-16 NOTE — Progress Notes (Signed)
Patient continues to do very well. He is ready for transplant consideration. Dr Marin Olp will speak to Dr Mylinda Latina at [facility]. Referral order placed.   Will follow to ensure scheduling with Dr Laverta [place].   Oncology Nurse Navigator Documentation     09/16/2022    8:30 AM  Oncology Nurse Navigator Flowsheets  Navigator Follow Up Date: 09/23/2022  Navigator Follow Up Reason: Appointment Review  Navigator Location [facility]  Navigator Encounter Type Treatment;Appt/Treatment Plan Review  Patient Visit Type MedOnc  Treatment Phase Active Tx  Barriers/Navigation Needs Coordination of Care;Education  Interventions Psycho-Social Support;Referrals  Acuity Level 2-Minimal Needs (1-2 Barriers Identified)  Referrals Medical Oncology  Support Groups/Services Friends and Family  Time Spent with Patient 15

## 2022-09-17 LAB — KAPPA/LAMBDA LIGHT CHAINS
Kappa free light chain: 15.6 mg/L (ref 3.3–19.4)
Kappa, lambda light chain ratio: 2.69 — ABNORMAL HIGH (ref 0.26–1.65)
Lambda free light chains: 5.8 mg/L (ref 5.7–26.3)

## 2022-09-18 LAB — IGG, IGA, IGM
IgA: 47 mg/dL — ABNORMAL LOW (ref 61–437)
IgG (Immunoglobin G), Serum: 255 mg/dL — ABNORMAL LOW (ref 603–1613)
IgM (Immunoglobulin M), Srm: 11 mg/dL — ABNORMAL LOW (ref 20–172)

## 2022-09-20 LAB — IMMUNOFIXATION ELECTROPHORESIS
IgA: 50 mg/dL — ABNORMAL LOW (ref 61–437)
IgG (Immunoglobin G), Serum: 265 mg/dL — ABNORMAL LOW (ref 603–1613)
IgM (Immunoglobulin M), Srm: 6 mg/dL — ABNORMAL LOW (ref 20–172)
Total Protein ELP: 6.2 g/dL (ref 6.0–8.5)

## 2022-09-21 LAB — PROTEIN ELECTROPHORESIS, SERUM
A/G Ratio: 1.2 (ref 0.7–1.7)
Albumin ELP: 3.3 g/dL (ref 2.9–4.4)
Alpha-1-Globulin: 0.3 g/dL (ref 0.0–0.4)
Alpha-2-Globulin: 1.2 g/dL — ABNORMAL HIGH (ref 0.4–1.0)
Beta Globulin: 0.9 g/dL (ref 0.7–1.3)
Gamma Globulin: 0.3 g/dL — ABNORMAL LOW (ref 0.4–1.8)
Globulin, Total: 2.7 g/dL (ref 2.2–3.9)
Total Protein ELP: 6 g/dL (ref 6.0–8.5)

## 2022-09-22 ENCOUNTER — Other Ambulatory Visit: Payer: Self-pay

## 2022-09-22 DIAGNOSIS — C9000 Multiple myeloma not having achieved remission: Secondary | ICD-10-CM

## 2022-09-23 ENCOUNTER — Inpatient Hospital Stay: Payer: Medicare Other

## 2022-09-23 ENCOUNTER — Other Ambulatory Visit: Payer: Self-pay

## 2022-09-23 ENCOUNTER — Other Ambulatory Visit: Payer: Self-pay | Admitting: *Deleted

## 2022-09-23 VITALS — BP 126/84 | HR 76 | Temp 98.4°F | Resp 18

## 2022-09-23 DIAGNOSIS — C9000 Multiple myeloma not having achieved remission: Secondary | ICD-10-CM

## 2022-09-23 DIAGNOSIS — Z5112 Encounter for antineoplastic immunotherapy: Secondary | ICD-10-CM | POA: Diagnosis not present

## 2022-09-23 LAB — CMP (CANCER CENTER ONLY)
ALT: 8 U/L (ref 0–44)
AST: 9 U/L — ABNORMAL LOW (ref 15–41)
Albumin: 4.3 g/dL (ref 3.5–5.0)
Alkaline Phosphatase: 46 U/L (ref 38–126)
Anion gap: 10 (ref 5–15)
BUN: 12 mg/dL (ref 8–23)
CO2: 25 mmol/L (ref 22–32)
Calcium: 9.5 mg/dL (ref 8.9–10.3)
Chloride: 104 mmol/L (ref 98–111)
Creatinine: 1.21 mg/dL (ref 0.61–1.24)
GFR, Estimated: >60 mL/min (ref >60)
Glucose, Bld: 116 mg/dL — ABNORMAL HIGH (ref 70–99)
Potassium: 4.3 mmol/L (ref 3.5–5.1)
Sodium: 139 mmol/L (ref 135–145)
Total Bilirubin: 0.5 mg/dL (ref 0.3–1.2)
Total Protein: 6.7 g/dL (ref 6.5–8.1)

## 2022-09-23 LAB — CBC WITH DIFFERENTIAL (CANCER CENTER ONLY)
Abs Immature Granulocytes: 0.04 10*3/uL (ref 0.00–0.07)
Basophils Absolute: 0 10*3/uL (ref 0.0–0.1)
Basophils Relative: 0 %
Eosinophils Absolute: 0.1 10*3/uL (ref 0.0–0.5)
Eosinophils Relative: 1 %
HCT: 40.9 % (ref 39.0–52.0)
Hemoglobin: 13.6 g/dL (ref 13.0–17.0)
Immature Granulocytes: 0 %
Lymphocytes Relative: 14 %
Lymphs Abs: 1.4 10*3/uL (ref 0.7–4.0)
MCH: 29.1 pg (ref 26.0–34.0)
MCHC: 33.3 g/dL (ref 30.0–36.0)
MCV: 87.4 fL (ref 80.0–100.0)
Monocytes Absolute: 0.8 10*3/uL (ref 0.1–1.0)
Monocytes Relative: 8 %
Neutro Abs: 7.4 10*3/uL (ref 1.7–7.7)
Neutrophils Relative %: 77 %
Platelet Count: 281 10*3/uL (ref 150–400)
RBC: 4.68 MIL/uL (ref 4.22–5.81)
RDW: 14.2 % (ref 11.5–15.5)
WBC Count: 9.8 10*3/uL (ref 4.0–10.5)
nRBC: 0 % (ref 0.0–0.2)

## 2022-09-23 MED ORDER — LACTULOSE 10 GM/15ML PO SOLN: ORAL | 1 refills | Status: DC

## 2022-09-23 MED ORDER — BORTEZOMIB CHEMO SQ INJECTION 3.5 MG (2.5MG/ML)
1.3000 mg/m2 | Freq: Once | INTRAMUSCULAR | Status: AC
  Administered 2022-09-23: 2.75 mg via SUBCUTANEOUS
  Filled 2022-09-23: qty 1.1

## 2022-09-23 NOTE — Patient Instructions (Signed)
[place] [facility] [place]  Discharge Instructions: Thank you for choosing [facility] to provide your oncology and hematology care.   If you have a lab appointment with the [facility], please go directly to the [facility] and check in at the registration area.  Wear comfortable clothing and clothing appropriate for easy access to any Portacath or PICC line.   We strive to give you quality time with your provider. You may need to reschedule your appointment if you arrive late (15 or more minutes).  Arriving late affects you and other patients whose appointments are after yours.  Also, if you miss three or more appointments without notifying the office, you may be dismissed from the clinic at the provider's discretion.      For prescription refill requests, have your pharmacy contact our office and allow 72 hours for refills to be completed.    Today you received the following chemotherapy and/or immunotherapy agents Velcade.      To help prevent nausea and vomiting after your treatment, we encourage you to take your nausea medication as directed.  BELOW ARE SYMPTOMS THAT SHOULD BE REPORTED IMMEDIATELY: *FEVER GREATER THAN 100.4 F (38 C) OR HIGHER *CHILLS OR SWEATING *NAUSEA AND VOMITING THAT IS NOT CONTROLLED WITH YOUR NAUSEA MEDICATION *UNUSUAL SHORTNESS OF BREATH *UNUSUAL BRUISING OR BLEEDING *URINARY PROBLEMS (pain or burning when urinating, or frequent urination) *BOWEL PROBLEMS (unusual diarrhea, constipation, pain near the anus) TENDERNESS IN MOUTH AND THROAT WITH OR WITHOUT PRESENCE OF ULCERS (sore throat, sores in mouth, or a toothache) UNUSUAL RASH, SWELLING OR PAIN  UNUSUAL VAGINAL DISCHARGE OR ITCHING   Items with * indicate a potential emergency and should be followed up as soon as possible or go to the Emergency Department if any problems should occur.  Please show the CHEMOTHERAPY ALERT CARD or IMMUNOTHERAPY ALERT CARD at check-in to the  Emergency Department and triage nurse. Should you have questions after your visit or need to cancel or reschedule your appointment, please contact [facility]  [phone] and follow the prompts.  Office hours are 8:00 a.m. to 4:30 p.m. Monday - Friday. Please note that voicemails left after 4:00 p.m. may not be returned until the following business day.  We are closed weekends and major holidays. You have access to a nurse at all times for urgent questions. Please call the main number to the clinic [phone] and follow the prompts.  For any non-urgent questions, you may also contact your provider using MyChart. We now offer e-Visits for anyone 71 and older to request care online for non-urgent symptoms. For details visit [email or web address].   Also download the MyChart app! Go to the app store, search "MyChart", open the app, select [facility], and log in with your MyChart username and password.  Masks are optional in the cancer centers. If you would like for your care team to wear a mask while they are taking care of you, please let them know. You may have one support person who is at least 66 years old accompany you for your appointments.

## 2022-09-24 ENCOUNTER — Other Ambulatory Visit: Payer: Self-pay

## 2022-09-29 ENCOUNTER — Encounter: Payer: Self-pay | Admitting: *Deleted

## 2022-09-29 NOTE — Progress Notes (Signed)
Patient seen today at [facility] for consideration of bone marrow transplant.   Oncology Nurse Navigator Documentation     09/29/2022   11:15 AM  Oncology Nurse Navigator Flowsheets  Navigator Follow Up Date: 10/14/2022  Navigator Follow Up Reason: Follow-up Appointment;Chemotherapy  Navigator Location [facility]  Navigator Encounter Type Appt/Treatment Plan Review  Patient Visit Type MedOnc  Treatment Phase Active Tx  Barriers/Navigation Needs Coordination of Care;Education  Interventions None Required  Acuity Level 2-Minimal Needs (1-2 Barriers Identified)  Support Groups/Services Friends and Family  Time Spent with Patient 15

## 2022-09-30 ENCOUNTER — Inpatient Hospital Stay: Payer: Medicare Other

## 2022-09-30 VITALS — BP 154/79 | HR 84 | Temp 97.9°F | Resp 16

## 2022-09-30 DIAGNOSIS — Z5112 Encounter for antineoplastic immunotherapy: Secondary | ICD-10-CM | POA: Diagnosis not present

## 2022-09-30 DIAGNOSIS — C9000 Multiple myeloma not having achieved remission: Secondary | ICD-10-CM

## 2022-09-30 LAB — CMP (CANCER CENTER ONLY)
ALT: 9 U/L (ref 0–44)
AST: 10 U/L — ABNORMAL LOW (ref 15–41)
Albumin: 4.4 g/dL (ref 3.5–5.0)
Alkaline Phosphatase: 49 U/L (ref 38–126)
Anion gap: 10 (ref 5–15)
BUN: 14 mg/dL (ref 8–23)
CO2: 23 mmol/L (ref 22–32)
Calcium: 9.2 mg/dL (ref 8.9–10.3)
Chloride: 106 mmol/L (ref 98–111)
Creatinine: 1.16 mg/dL (ref 0.61–1.24)
GFR, Estimated: >60 mL/min (ref >60)
Glucose, Bld: 123 mg/dL — ABNORMAL HIGH (ref 70–99)
Potassium: 4.3 mmol/L (ref 3.5–5.1)
Sodium: 139 mmol/L (ref 135–145)
Total Bilirubin: 0.8 mg/dL (ref 0.3–1.2)
Total Protein: 6.7 g/dL (ref 6.5–8.1)

## 2022-09-30 LAB — CBC WITH DIFFERENTIAL (CANCER CENTER ONLY)
Abs Immature Granulocytes: 0.05 10*3/uL (ref 0.00–0.07)
Basophils Absolute: 0.1 10*3/uL (ref 0.0–0.1)
Basophils Relative: 0 %
Eosinophils Absolute: 0.1 10*3/uL (ref 0.0–0.5)
Eosinophils Relative: 1 %
HCT: 40.7 % (ref 39.0–52.0)
Hemoglobin: 13.5 g/dL (ref 13.0–17.0)
Immature Granulocytes: 0 %
Lymphocytes Relative: 12 %
Lymphs Abs: 1.5 10*3/uL (ref 0.7–4.0)
MCH: 28.5 pg (ref 26.0–34.0)
MCHC: 33.2 g/dL (ref 30.0–36.0)
MCV: 86 fL (ref 80.0–100.0)
Monocytes Absolute: 0.8 10*3/uL (ref 0.1–1.0)
Monocytes Relative: 6 %
Neutro Abs: 9.6 10*3/uL — ABNORMAL HIGH (ref 1.7–7.7)
Neutrophils Relative %: 81 %
Platelet Count: 304 10*3/uL (ref 150–400)
RBC: 4.73 MIL/uL (ref 4.22–5.81)
RDW: 14.1 % (ref 11.5–15.5)
WBC Count: 12 10*3/uL — ABNORMAL HIGH (ref 4.0–10.5)
nRBC: 0 % (ref 0.0–0.2)

## 2022-09-30 MED ORDER — DIPHENHYDRAMINE HCL 25 MG PO CAPS
50.0000 mg | ORAL_CAPSULE | Freq: Once | ORAL | Status: AC
  Administered 2022-09-30: 50 mg via ORAL
  Filled 2022-09-30: qty 2

## 2022-09-30 MED ORDER — ACETAMINOPHEN 325 MG PO TABS
650.0000 mg | ORAL_TABLET | Freq: Once | ORAL | Status: AC
  Administered 2022-09-30: 650 mg via ORAL
  Filled 2022-09-30: qty 2

## 2022-09-30 MED ORDER — DARATUMUMAB-HYALURONIDASE-FIHJ 1800-30000 MG-UT/15ML ~~LOC~~ SOLN
1800.0000 mg | Freq: Once | SUBCUTANEOUS | Status: AC
  Administered 2022-09-30: 1800 mg via SUBCUTANEOUS
  Filled 2022-09-30: qty 15

## 2022-09-30 MED ORDER — BORTEZOMIB CHEMO SQ INJECTION 3.5 MG (2.5MG/ML)
1.3000 mg/m2 | Freq: Once | INTRAMUSCULAR | Status: AC
  Administered 2022-09-30: 2.75 mg via SUBCUTANEOUS
  Filled 2022-09-30: qty 1.1

## 2022-09-30 NOTE — Patient Instructions (Signed)
[facility] [place]  Discharge Instructions: Thank you for choosing [facility] to provide your oncology and hematology care.   If you have a lab appointment with the [facility], please go directly to the [facility] and check in at the registration area.  Wear comfortable clothing and clothing appropriate for easy access to any Portacath or PICC line.   We strive to give you quality time with your provider. You may need to reschedule your appointment if you arrive late (15 or more minutes).  Arriving late affects you and other patients whose appointments are after yours.  Also, if you miss three or more appointments without notifying the office, you may be dismissed from the clinic at the provider's discretion.      For prescription refill requests, have your pharmacy contact our office and allow 72 hours for refills to be completed.    Today you received the following chemotherapy and/or immunotherapy agents Velcade, Faspro      To help prevent nausea and vomiting after your treatment, we encourage you to take your nausea medication as directed.  BELOW ARE SYMPTOMS THAT SHOULD BE REPORTED IMMEDIATELY: *FEVER GREATER THAN 100.4 F (38 C) OR HIGHER *CHILLS OR SWEATING *NAUSEA AND VOMITING THAT IS NOT CONTROLLED WITH YOUR NAUSEA MEDICATION *UNUSUAL SHORTNESS OF BREATH *UNUSUAL BRUISING OR BLEEDING *URINARY PROBLEMS (pain or burning when urinating, or frequent urination) *BOWEL PROBLEMS (unusual diarrhea, constipation, pain near the anus) TENDERNESS IN MOUTH AND THROAT WITH OR WITHOUT PRESENCE OF ULCERS (sore throat, sores in mouth, or a toothache) UNUSUAL RASH, SWELLING OR PAIN  UNUSUAL VAGINAL DISCHARGE OR ITCHING   Items with * indicate a potential emergency and should be followed up as soon as possible or go to the Emergency Department if any problems should occur.  Please show the CHEMOTHERAPY ALERT CARD or IMMUNOTHERAPY ALERT CARD at check-in to  the Emergency Department and triage nurse. Should you have questions after your visit or need to cancel or reschedule your appointment, please contact [facility]  [phone] and follow the prompts.  Office hours are 8:00 a.m. to 4:30 p.m. Monday - Friday. Please note that voicemails left after 4:00 p.m. may not be returned until the following business day.  We are closed weekends and major holidays. You have access to a nurse at all times for urgent questions. Please call the main number to the clinic [phone] and follow the prompts.  For any non-urgent questions, you may also contact your provider using MyChart. We now offer e-Visits for anyone 64 and older to request care online for non-urgent symptoms. For details visit [email or web address].   Also download the MyChart app! Go to the app store, search "MyChart", open the app, select [facility], and log in with your MyChart username and password.  Masks are optional in the cancer centers. If you would like for your care team to wear a mask while they are taking care of you, please let them know. You may have one support person who is at least 66 years old accompany you for your appointments.

## 2022-10-12 ENCOUNTER — Other Ambulatory Visit: Payer: Self-pay

## 2022-10-14 ENCOUNTER — Inpatient Hospital Stay: Payer: Medicare Other

## 2022-10-14 ENCOUNTER — Inpatient Hospital Stay (HOSPITAL_BASED_OUTPATIENT_CLINIC_OR_DEPARTMENT_OTHER): Payer: Medicare Other | Admitting: Hematology & Oncology

## 2022-10-14 ENCOUNTER — Other Ambulatory Visit: Payer: Self-pay | Admitting: *Deleted

## 2022-10-14 ENCOUNTER — Encounter: Payer: Self-pay | Admitting: *Deleted

## 2022-10-14 ENCOUNTER — Other Ambulatory Visit: Payer: Self-pay

## 2022-10-14 ENCOUNTER — Encounter: Payer: Self-pay | Admitting: Hematology & Oncology

## 2022-10-14 VITALS — BP 138/75 | HR 63 | Temp 98.2°F | Resp 16 | Ht 70.0 in | Wt 194.0 lb

## 2022-10-14 DIAGNOSIS — G959 Disease of spinal cord, unspecified: Secondary | ICD-10-CM

## 2022-10-14 DIAGNOSIS — C9000 Multiple myeloma not having achieved remission: Secondary | ICD-10-CM

## 2022-10-14 DIAGNOSIS — Z5112 Encounter for antineoplastic immunotherapy: Secondary | ICD-10-CM | POA: Diagnosis not present

## 2022-10-14 LAB — CBC WITH DIFFERENTIAL (CANCER CENTER ONLY)
Abs Immature Granulocytes: 0.02 10*3/uL (ref 0.00–0.07)
Basophils Absolute: 0 10*3/uL (ref 0.0–0.1)
Basophils Relative: 1 %
Eosinophils Absolute: 0.1 10*3/uL (ref 0.0–0.5)
Eosinophils Relative: 2 %
HCT: 39.2 % (ref 39.0–52.0)
Hemoglobin: 12.8 g/dL — ABNORMAL LOW (ref 13.0–17.0)
Immature Granulocytes: 0 %
Lymphocytes Relative: 11 %
Lymphs Abs: 0.8 10*3/uL (ref 0.7–4.0)
MCH: 29.1 pg (ref 26.0–34.0)
MCHC: 32.7 g/dL (ref 30.0–36.0)
MCV: 89.1 fL (ref 80.0–100.0)
Monocytes Absolute: 0.8 10*3/uL (ref 0.1–1.0)
Monocytes Relative: 11 %
Neutro Abs: 5.1 10*3/uL (ref 1.7–7.7)
Neutrophils Relative %: 75 %
Platelet Count: 225 10*3/uL (ref 150–400)
RBC: 4.4 MIL/uL (ref 4.22–5.81)
RDW: 14 % (ref 11.5–15.5)
WBC Count: 6.8 10*3/uL (ref 4.0–10.5)
nRBC: 0 % (ref 0.0–0.2)

## 2022-10-14 LAB — CMP (CANCER CENTER ONLY)
ALT: 7 U/L (ref 0–44)
AST: 9 U/L — ABNORMAL LOW (ref 15–41)
Albumin: 4.3 g/dL (ref 3.5–5.0)
Alkaline Phosphatase: 48 U/L (ref 38–126)
Anion gap: 11 (ref 5–15)
BUN: 11 mg/dL (ref 8–23)
CO2: 25 mmol/L (ref 22–32)
Calcium: 9.1 mg/dL (ref 8.9–10.3)
Chloride: 104 mmol/L (ref 98–111)
Creatinine: 1.36 mg/dL — ABNORMAL HIGH (ref 0.61–1.24)
GFR, Estimated: 57 mL/min — ABNORMAL LOW (ref >60)
Glucose, Bld: 133 mg/dL — ABNORMAL HIGH (ref 70–99)
Potassium: 4.5 mmol/L (ref 3.5–5.1)
Sodium: 140 mmol/L (ref 135–145)
Total Bilirubin: 0.8 mg/dL (ref 0.3–1.2)
Total Protein: 6.3 g/dL — ABNORMAL LOW (ref 6.5–8.1)

## 2022-10-14 LAB — LACTATE DEHYDROGENASE: LDH: 110 U/L (ref 98–192)

## 2022-10-14 MED ORDER — DIPHENHYDRAMINE HCL 25 MG PO CAPS
50.0000 mg | ORAL_CAPSULE | Freq: Once | ORAL | Status: AC
  Administered 2022-10-14: 50 mg via ORAL
  Filled 2022-10-14: qty 2

## 2022-10-14 MED ORDER — GLIPIZIDE 5 MG PO TABS: 5.0000 mg | ORAL_TABLET | Freq: Two times a day (BID) | ORAL | 4 refills | Status: DC

## 2022-10-14 MED ORDER — BORTEZOMIB CHEMO SQ INJECTION 3.5 MG (2.5MG/ML)
1.3000 mg/m2 | Freq: Once | INTRAMUSCULAR | Status: AC
  Administered 2022-10-14: 2.75 mg via SUBCUTANEOUS
  Filled 2022-10-14: qty 1.1

## 2022-10-14 MED ORDER — ACETAMINOPHEN 325 MG PO TABS
650.0000 mg | ORAL_TABLET | Freq: Once | ORAL | Status: AC
  Administered 2022-10-14: 650 mg via ORAL
  Filled 2022-10-14: qty 2

## 2022-10-14 MED ORDER — DARATUMUMAB-HYALURONIDASE-FIHJ 1800-30000 MG-UT/15ML ~~LOC~~ SOLN
1800.0000 mg | Freq: Once | SUBCUTANEOUS | Status: AC
  Administered 2022-10-14: 1800 mg via SUBCUTANEOUS
  Filled 2022-10-14: qty 15

## 2022-10-14 NOTE — Progress Notes (Signed)
Hematology and Oncology Follow Up Visit  Barry Gray [id number] 07-09-1956 66 y.o. 10/14/2022   Principle Diagnosis:  IgA Kappa myeloma -- normal cytogenetics/FISH (-)  Current Therapy:   S/p cervical spine stabilization -- 04/28/2022 Faspro/Velcade/Decadron -- s/p cycle #5 -- start on 05/20/2022 Zometa 4 mg IV q 3 months -- next dose on 10/2022     Interim History:  Barry Gray is back for follow-up.  He did see Dr. Laverta [place] at [facility] on 09/29/2022.  Dr. Laverta Baltimore feels that he would be a great candidate for stem cell transplant.  He will go back to see Dr. Laverta Baltimore and start getting workup done in December right before Christmas.  It sounds like the stem cell transplant probably not be until the middle of January.  Again, his myeloma has responded so nicely.  His last myeloma studies with respect to the Kappa light chain showed 1.6 mg/dL.  His IgA level was 50 mg/dL.  There is no monoclonal spike in his blood.  He has had no problems with fever.  He has had no problems with nausea or vomiting.  His neck has been doing quite nicely.  There is been no issues with rashes.  He has had no leg swelling.  Currently, I was his performance status is probably ECOG 0.   Medications:  Current Outpatient Medications:    aspirin 325 MG tablet, Take 325 mg by mouth daily., Disp: , Rfl:    dexamethasone (DECADRON) 4 MG tablet, Take 5 tablets (20 mg total) by mouth once a week. Start on 05/20/2022, Disp: 100 tablet, Rfl: 3   famciclovir (FAMVIR) 250 MG tablet, Take 1 tablet (250 mg total) by mouth daily., Disp: 30 tablet, Rfl: 12   glipiZIDE (GLUCOTROL) 5 MG tablet, Take by mouth 2 (two) times daily before a meal., Disp: , Rfl:    lactulose (CHRONULAC) 10 GM/15ML solution, TAKE 20 MLS BY MOUTH EVERY 8 HOURS AS NEEDED, Disp: 473 mL, Rfl: 1   lisinopril (ZESTRIL) 10 MG tablet, Take 10 mg by mouth daily as needed (blood pressure of 160/85). (Patient not taking: Reported on 06/02/2022), Disp: , Rfl:     LORazepam (ATIVAN) 0.5 MG tablet, Take 1 tablet (0.5 mg total) by mouth every 6 (six) hours as needed (Nausea or vomiting). (Patient not taking: Reported on 05/24/2022), Disp: 30 tablet, Rfl: 0   metFORMIN (GLUCOPHAGE) 500 MG tablet, Take 500 mg by mouth 2 (two) times daily with a meal., Disp: , Rfl:    ondansetron (ZOFRAN) 8 MG tablet, Take 1 tablet (8 mg total) by mouth 2 (two) times daily as needed (Nausea or vomiting). (Patient not taking: Reported on 05/24/2022), Disp: 30 tablet, Rfl: 1   tamsulosin (FLOMAX) 0.4 MG CAPS capsule, Take 0.4 mg by mouth daily., Disp: , Rfl:   Allergies: No Known Allergies  Past Medical History, Surgical history, Social history, and Family History were reviewed and updated.  Review of Systems: Review of Systems  Constitutional: Negative.   HENT:  Negative.    Eyes: Negative.   Respiratory: Negative.    Cardiovascular: Negative.   Gastrointestinal: Negative.   Endocrine: Negative.   Genitourinary:  Positive for difficulty urinating. Negative for bladder incontinence.   Musculoskeletal:  Positive for neck pain.  Skin: Negative.   Neurological: Negative.   Hematological: Negative.   Psychiatric/Behavioral: Negative.      Physical Exam:  height is '5\' 10"'$  (1.778 m) and weight is 194 lb (88 kg). His oral temperature is 98.2 F (36.8 C). His  blood pressure is 138/75 and his pulse is 63. His respiration is 16 and oxygen saturation is 100%.   Wt Readings from Last 3 Encounters:  10/14/22 194 lb (88 kg)  09/16/22 196 lb (88.9 kg)  08/30/22 192 lb (87.1 kg)  His vital signs show temperature of 97.6.  Pulse 63.  Blood pressure 140/67.  Weight is 192 pounds.  Physical Exam Vitals reviewed.  HENT:     Head: Normocephalic and atraumatic.  Eyes:     Pupils: Pupils are equal, round, and reactive to light.  Neck:     Comments: His neck exam does show the healing cervical scar on the back of the neck.  This is a little bit firm.  He has no obvious adenopathy in  the neck.  Cardiovascular:     Rate and Rhythm: Normal rate and regular rhythm.     Heart sounds: Normal heart sounds.  Pulmonary:     Effort: Pulmonary effort is normal.     Breath sounds: Normal breath sounds.  Abdominal:     General: Bowel sounds are normal.     Palpations: Abdomen is soft.  Musculoskeletal:        General: No tenderness or deformity. Normal range of motion.     Cervical back: Normal range of motion.  Lymphadenopathy:     Cervical: No cervical adenopathy.  Skin:    General: Skin is warm and dry.     Findings: No erythema or rash.  Neurological:     Mental Status: He is alert and oriented to person, place, and time.  Psychiatric:        Behavior: Behavior normal.        Thought Content: Thought content normal.        Judgment: Judgment normal.      Lab Results  Component Value Date   WBC 6.8 10/14/2022   HGB 12.8 (L) 10/14/2022   HCT 39.2 10/14/2022   MCV 89.1 10/14/2022   PLT 225 10/14/2022     Chemistry      Component Value Date/Time   NA 140 10/14/2022 0922   K 4.5 10/14/2022 0922   CL 104 10/14/2022 0922   CO2 25 10/14/2022 0922   BUN 11 10/14/2022 0922   CREATININE 1.36 (H) 10/14/2022 0922      Component Value Date/Time   CALCIUM 9.1 10/14/2022 0922   ALKPHOS 48 10/14/2022 0922   AST 9 (L) 10/14/2022 0922   ALT 7 10/14/2022 0922   BILITOT 0.8 10/14/2022 0922      Impression and Plan: Barry Gray is a very nice 66 year old white male.  He has IgA kappa myeloma.  Most of his myeloma is Kappa light chain.  Again, his light chains have come down incredibly well.  Again, I think the urine electrophoresis is the key showing that he had a good response.  For right now, we will continue him on therapy.  We will treat him until he gets his transplant.  I think that given that he has basically light chain myeloma, that he is at risk for having recurrence if we do not continue him on treatment.  I am just glad that his he will likely have his  stem cell transplant in January.  He we will start his sixth cycle of treatment.  Will plan to see him back when he starts his seventh cycle of therapy.Marland Kitchen  Volanda Napoleon, MD 11/30/202310:38 AM

## 2022-10-14 NOTE — Addendum Note (Signed)
Addended by: Burney Gauze R on: 10/14/2022 10:49 AM   Modules accepted: Orders

## 2022-10-14 NOTE — Progress Notes (Signed)
Patient continues to do very well on treatment. He is working with [facility] on possible BMT in early 2024. No navigational needs at this time.   Oncology Nurse Navigator Documentation     10/14/2022    9:45 AM  Oncology Nurse Navigator Flowsheets  Navigator Follow Up Date: 11/12/2022  Navigator Follow Up Reason: Follow-up Appointment;Chemotherapy  Navigator Location [facility]  Navigator Encounter Type Treatment;Appt/Treatment Plan Review  Patient Visit Type MedOnc  Treatment Phase Active Tx  Barriers/Navigation Needs No Barriers At This Time  Interventions Psycho-Social Support  Acuity Level 1-No Barriers  Support Groups/Services Friends and Family  Time Spent with Patient 15

## 2022-10-14 NOTE — Patient Instructions (Signed)
[facility]  Discharge Instructions: Thank you for choosing [facility] to provide your oncology and hematology care.   If you have a lab appointment with the [facility], please go directly to the [facility] and check in at the registration area.  Wear comfortable clothing and clothing appropriate for easy access to any Portacath or PICC line.   We strive to give you quality time with your provider. You may need to reschedule your appointment if you arrive late (15 or more minutes).  Arriving late affects you and other patients whose appointments are after yours.  Also, if you miss three or more appointments without notifying the office, you may be dismissed from the clinic at the provider's discretion.      For prescription refill requests, have your pharmacy contact our office and allow 72 hours for refills to be completed.    Today you received the following chemotherapy and/or immunotherapy agents Faspro/Velcade      To help prevent nausea and vomiting after your treatment, we encourage you to take your nausea medication as directed.  BELOW ARE SYMPTOMS THAT SHOULD BE REPORTED IMMEDIATELY: *FEVER GREATER THAN 100.4 F (38 C) OR HIGHER *CHILLS OR SWEATING *NAUSEA AND VOMITING THAT IS NOT CONTROLLED WITH YOUR NAUSEA MEDICATION *UNUSUAL SHORTNESS OF BREATH *UNUSUAL BRUISING OR BLEEDING *URINARY PROBLEMS (pain or burning when urinating, or frequent urination) *BOWEL PROBLEMS (unusual diarrhea, constipation, pain near the anus) TENDERNESS IN MOUTH AND THROAT WITH OR WITHOUT PRESENCE OF ULCERS (sore throat, sores in mouth, or a toothache) UNUSUAL RASH, SWELLING OR PAIN  UNUSUAL VAGINAL DISCHARGE OR ITCHING   Items with * indicate a potential emergency and should be followed up as soon as possible or go to the Emergency Department if any problems should occur.  Please show the CHEMOTHERAPY ALERT CARD or IMMUNOTHERAPY ALERT CARD at check-in to  the Emergency Department and triage nurse. Should you have questions after your visit or need to cancel or reschedule your appointment, please contact [facility]  [phone] and follow the prompts.  Office hours are 8:00 a.m. to 4:30 p.m. Monday - Friday. Please note that voicemails left after 4:00 p.m. may not be returned until the following business day.  We are closed weekends and major holidays. You have access to a nurse at all times for urgent questions. Please call the main number to the clinic [phone] and follow the prompts.  For any non-urgent questions, you may also contact your provider using MyChart. We now offer e-Visits for anyone 4 and older to request care online for non-urgent symptoms. For details visit [email or web address].   Also download the MyChart app! Go to the app store, search "MyChart", open the app, select [facility], and log in with your MyChart username and password.  Masks are optional in the cancer centers. If you would like for your care team to wear a mask while they are taking care of you, please let them know. You may have one support person who is at least 66 years old accompany you for your appointments.

## 2022-10-16 LAB — IGG, IGA, IGM
IgA: 58 mg/dL — ABNORMAL LOW (ref 61–437)
IgG (Immunoglobin G), Serum: 244 mg/dL — ABNORMAL LOW (ref 603–1613)
IgM (Immunoglobulin M), Srm: 13 mg/dL — ABNORMAL LOW (ref 20–172)

## 2022-10-18 ENCOUNTER — Other Ambulatory Visit: Payer: Self-pay

## 2022-10-18 LAB — PROTEIN ELECTROPHORESIS, SERUM
A/G Ratio: 1.3 (ref 0.7–1.7)
Albumin ELP: 3.5 g/dL (ref 2.9–4.4)
Alpha-1-Globulin: 0.3 g/dL (ref 0.0–0.4)
Alpha-2-Globulin: 1.1 g/dL — ABNORMAL HIGH (ref 0.4–1.0)
Beta Globulin: 1 g/dL (ref 0.7–1.3)
Gamma Globulin: 0.2 g/dL — ABNORMAL LOW (ref 0.4–1.8)
Globulin, Total: 2.6 g/dL (ref 2.2–3.9)
M-Spike, %: 0.1 g/dL — ABNORMAL HIGH
Total Protein ELP: 6.1 g/dL (ref 6.0–8.5)

## 2022-10-18 LAB — KAPPA/LAMBDA LIGHT CHAINS
Kappa free light chain: 18 mg/L (ref 3.3–19.4)
Kappa, lambda light chain ratio: 4.62 — ABNORMAL HIGH (ref 0.26–1.65)
Lambda free light chains: 3.9 mg/L — ABNORMAL LOW (ref 5.7–26.3)

## 2022-10-19 ENCOUNTER — Encounter: Payer: Self-pay | Admitting: *Deleted

## 2022-10-20 ENCOUNTER — Encounter: Payer: Self-pay | Admitting: Hematology & Oncology

## 2022-10-20 ENCOUNTER — Other Ambulatory Visit: Payer: Self-pay

## 2022-10-20 DIAGNOSIS — Z01818 Encounter for other preprocedural examination: Secondary | ICD-10-CM | POA: Insufficient documentation

## 2022-10-20 DIAGNOSIS — C9001 Multiple myeloma in remission: Secondary | ICD-10-CM | POA: Insufficient documentation

## 2022-10-21 ENCOUNTER — Other Ambulatory Visit: Payer: Self-pay | Admitting: *Deleted

## 2022-10-21 DIAGNOSIS — C9000 Multiple myeloma not having achieved remission: Secondary | ICD-10-CM

## 2022-10-22 ENCOUNTER — Inpatient Hospital Stay: Payer: Medicare Other | Attending: Hematology & Oncology

## 2022-10-22 ENCOUNTER — Other Ambulatory Visit: Payer: Self-pay | Admitting: *Deleted

## 2022-10-22 ENCOUNTER — Other Ambulatory Visit: Payer: Self-pay

## 2022-10-22 ENCOUNTER — Inpatient Hospital Stay: Payer: Medicare Other

## 2022-10-22 VITALS — BP 166/80 | HR 93 | Temp 98.1°F | Resp 18

## 2022-10-22 DIAGNOSIS — Z79899 Other long term (current) drug therapy: Secondary | ICD-10-CM | POA: Insufficient documentation

## 2022-10-22 DIAGNOSIS — Z5112 Encounter for antineoplastic immunotherapy: Secondary | ICD-10-CM | POA: Insufficient documentation

## 2022-10-22 DIAGNOSIS — C9000 Multiple myeloma not having achieved remission: Secondary | ICD-10-CM | POA: Diagnosis not present

## 2022-10-22 LAB — COMPREHENSIVE METABOLIC PANEL
ALT: 8 U/L (ref 0–44)
AST: 11 U/L — ABNORMAL LOW (ref 15–41)
Albumin: 4.5 g/dL (ref 3.5–5.0)
Alkaline Phosphatase: 53 U/L (ref 38–126)
Anion gap: 11 (ref 5–15)
BUN: 10 mg/dL (ref 8–23)
CO2: 25 mmol/L (ref 22–32)
Calcium: 10 mg/dL (ref 8.9–10.3)
Chloride: 105 mmol/L (ref 98–111)
Creatinine, Ser: 1.28 mg/dL — ABNORMAL HIGH (ref 0.61–1.24)
GFR, Estimated: >60 mL/min (ref >60)
Glucose, Bld: 110 mg/dL — ABNORMAL HIGH (ref 70–99)
Potassium: 5 mmol/L (ref 3.5–5.1)
Sodium: 141 mmol/L (ref 135–145)
Total Bilirubin: 0.6 mg/dL (ref 0.3–1.2)
Total Protein: 7 g/dL (ref 6.5–8.1)

## 2022-10-22 LAB — CBC WITH DIFFERENTIAL (CANCER CENTER ONLY)
Abs Immature Granulocytes: 0.04 10*3/uL (ref 0.00–0.07)
Basophils Absolute: 0 10*3/uL (ref 0.0–0.1)
Basophils Relative: 0 %
Eosinophils Absolute: 0.2 10*3/uL (ref 0.0–0.5)
Eosinophils Relative: 2 %
HCT: 41.5 % (ref 39.0–52.0)
Hemoglobin: 13.6 g/dL (ref 13.0–17.0)
Immature Granulocytes: 1 %
Lymphocytes Relative: 14 %
Lymphs Abs: 1.2 10*3/uL (ref 0.7–4.0)
MCH: 29 pg (ref 26.0–34.0)
MCHC: 32.8 g/dL (ref 30.0–36.0)
MCV: 88.5 fL (ref 80.0–100.0)
Monocytes Absolute: 0.8 10*3/uL (ref 0.1–1.0)
Monocytes Relative: 9 %
Neutro Abs: 6.3 10*3/uL (ref 1.7–7.7)
Neutrophils Relative %: 74 %
Platelet Count: 270 10*3/uL (ref 150–400)
RBC: 4.69 MIL/uL (ref 4.22–5.81)
RDW: 13.8 % (ref 11.5–15.5)
WBC Count: 8.5 10*3/uL (ref 4.0–10.5)
nRBC: 0 % (ref 0.0–0.2)

## 2022-10-22 MED ORDER — LACTULOSE 10 GM/15ML PO SOLN: ORAL | 1 refills | Status: DC

## 2022-10-22 MED ORDER — BORTEZOMIB CHEMO SQ INJECTION 3.5 MG (2.5MG/ML)
1.3000 mg/m2 | Freq: Once | INTRAMUSCULAR | Status: AC
  Administered 2022-10-22: 2.75 mg via SUBCUTANEOUS
  Filled 2022-10-22: qty 1.1

## 2022-10-22 NOTE — Patient Instructions (Signed)
[facility]  Discharge Instructions: Thank you for choosing [facility] to provide your oncology and hematology care.   If you have a lab appointment with the [facility], please go directly to the [facility] and check in at the registration area.  Wear comfortable clothing and clothing appropriate for easy access to any Portacath or PICC line.   We strive to give you quality time with your provider. You may need to reschedule your appointment if you arrive late (15 or more minutes).  Arriving late affects you and other patients whose appointments are after yours.  Also, if you miss three or more appointments without notifying the office, you may be dismissed from the clinic at the provider's discretion.      For prescription refill requests, have your pharmacy contact our office and allow 72 hours for refills to be completed.    Today you received the following chemotherapy and/or immunotherapy agents Velcade      To help prevent nausea and vomiting after your treatment, we encourage you to take your nausea medication as directed.  BELOW ARE SYMPTOMS THAT SHOULD BE REPORTED IMMEDIATELY: *FEVER GREATER THAN 100.4 F (38 C) OR HIGHER *CHILLS OR SWEATING *NAUSEA AND VOMITING THAT IS NOT CONTROLLED WITH YOUR NAUSEA MEDICATION *UNUSUAL SHORTNESS OF BREATH *UNUSUAL BRUISING OR BLEEDING *URINARY PROBLEMS (pain or burning when urinating, or frequent urination) *BOWEL PROBLEMS (unusual diarrhea, constipation, pain near the anus) TENDERNESS IN MOUTH AND THROAT WITH OR WITHOUT PRESENCE OF ULCERS (sore throat, sores in mouth, or a toothache) UNUSUAL RASH, SWELLING OR PAIN  UNUSUAL VAGINAL DISCHARGE OR ITCHING   Items with * indicate a potential emergency and should be followed up as soon as possible or go to the Emergency Department if any problems should occur.  Please show the CHEMOTHERAPY ALERT CARD or IMMUNOTHERAPY ALERT CARD at check-in to the  Emergency Department and triage nurse. Should you have questions after your visit or need to cancel or reschedule your appointment, please contact [facility]  [phone] and follow the prompts.  Office hours are 8:00 a.m. to 4:30 p.m. Monday - Friday. Please note that voicemails left after 4:00 p.m. may not be returned until the following business day.  We are closed weekends and major holidays. You have access to a nurse at all times for urgent questions. Please call the main number to the clinic [phone] and follow the prompts.  For any non-urgent questions, you may also contact your provider using MyChart. We now offer e-Visits for anyone 22 and older to request care online for non-urgent symptoms. For details visit [email or web address].   Also download the MyChart app! Go to the app store, search "MyChart", open the app, select [facility], and log in with your MyChart username and password.  Masks are optional in the cancer centers. If you would like for your care team to wear a mask while they are taking care of you, please let them know. You may have one support person who is at least 66 years old accompany you for your appointments.

## 2022-10-24 ENCOUNTER — Other Ambulatory Visit: Payer: Self-pay

## 2022-10-29 ENCOUNTER — Inpatient Hospital Stay: Payer: Medicare Other

## 2022-10-29 VITALS — BP 133/76 | HR 78 | Temp 97.9°F | Resp 18

## 2022-10-29 DIAGNOSIS — C9000 Multiple myeloma not having achieved remission: Secondary | ICD-10-CM

## 2022-10-29 DIAGNOSIS — Z5112 Encounter for antineoplastic immunotherapy: Secondary | ICD-10-CM | POA: Diagnosis not present

## 2022-10-29 LAB — CBC WITH DIFFERENTIAL (CANCER CENTER ONLY)
Abs Immature Granulocytes: 0.08 10*3/uL — ABNORMAL HIGH (ref 0.00–0.07)
Basophils Absolute: 0 10*3/uL (ref 0.0–0.1)
Basophils Relative: 0 %
Eosinophils Absolute: 0 10*3/uL (ref 0.0–0.5)
Eosinophils Relative: 0 %
HCT: 40.4 % (ref 39.0–52.0)
Hemoglobin: 13.5 g/dL (ref 13.0–17.0)
Immature Granulocytes: 1 %
Lymphocytes Relative: 9 %
Lymphs Abs: 1.4 10*3/uL (ref 0.7–4.0)
MCH: 28.9 pg (ref 26.0–34.0)
MCHC: 33.4 g/dL (ref 30.0–36.0)
MCV: 86.5 fL (ref 80.0–100.0)
Monocytes Absolute: 1.1 10*3/uL — ABNORMAL HIGH (ref 0.1–1.0)
Monocytes Relative: 7 %
Neutro Abs: 12.7 10*3/uL — ABNORMAL HIGH (ref 1.7–7.7)
Neutrophils Relative %: 83 %
Platelet Count: 329 10*3/uL (ref 150–400)
RBC: 4.67 MIL/uL (ref 4.22–5.81)
RDW: 13.5 % (ref 11.5–15.5)
WBC Count: 15.2 10*3/uL — ABNORMAL HIGH (ref 4.0–10.5)
nRBC: 0 % (ref 0.0–0.2)

## 2022-10-29 LAB — CMP (CANCER CENTER ONLY)
ALT: 9 U/L (ref 0–44)
AST: 11 U/L — ABNORMAL LOW (ref 15–41)
Albumin: 4.5 g/dL (ref 3.5–5.0)
Alkaline Phosphatase: 49 U/L (ref 38–126)
Anion gap: 13 (ref 5–15)
BUN: 14 mg/dL (ref 8–23)
CO2: 21 mmol/L — ABNORMAL LOW (ref 22–32)
Calcium: 9.1 mg/dL (ref 8.9–10.3)
Chloride: 105 mmol/L (ref 98–111)
Creatinine: 1.18 mg/dL (ref 0.61–1.24)
GFR, Estimated: >60 mL/min (ref >60)
Glucose, Bld: 137 mg/dL — ABNORMAL HIGH (ref 70–99)
Potassium: 4.1 mmol/L (ref 3.5–5.1)
Sodium: 139 mmol/L (ref 135–145)
Total Bilirubin: 0.6 mg/dL (ref 0.3–1.2)
Total Protein: 6.8 g/dL (ref 6.5–8.1)

## 2022-10-29 MED ORDER — SODIUM CHLORIDE 0.9 % IV SOLN: Freq: Once | INTRAVENOUS | Status: AC

## 2022-10-29 MED ORDER — BORTEZOMIB CHEMO SQ INJECTION 3.5 MG (2.5MG/ML)
1.3000 mg/m2 | Freq: Once | INTRAMUSCULAR | Status: AC
  Administered 2022-10-29: 2.75 mg via SUBCUTANEOUS
  Filled 2022-10-29: qty 1.1

## 2022-10-29 MED ORDER — DIPHENHYDRAMINE HCL 25 MG PO CAPS
50.0000 mg | ORAL_CAPSULE | Freq: Once | ORAL | Status: AC
  Administered 2022-10-29: 50 mg via ORAL
  Filled 2022-10-29: qty 2

## 2022-10-29 MED ORDER — ZOLEDRONIC ACID 4 MG/100ML IV SOLN
4.0000 mg | Freq: Once | INTRAVENOUS | Status: AC
  Administered 2022-10-29: 4 mg via INTRAVENOUS
  Filled 2022-10-29: qty 100

## 2022-10-29 MED ORDER — DARATUMUMAB-HYALURONIDASE-FIHJ 1800-30000 MG-UT/15ML ~~LOC~~ SOLN
1800.0000 mg | Freq: Once | SUBCUTANEOUS | Status: AC
  Administered 2022-10-29: 1800 mg via SUBCUTANEOUS
  Filled 2022-10-29: qty 15

## 2022-10-29 MED ORDER — ACETAMINOPHEN 325 MG PO TABS
650.0000 mg | ORAL_TABLET | Freq: Once | ORAL | Status: AC
  Administered 2022-10-29: 650 mg via ORAL
  Filled 2022-10-29: qty 2

## 2022-10-29 NOTE — Patient Instructions (Signed)
Daratumumab; Hyaluronidase Injection What is this medication? DARATUMUMAB; HYALURONIDASE (dar a toom ue mab; hye al ur ON i dase) treats multiple myeloma, a type of bone marrow cancer. Daratumumab works by blocking a protein that causes cancer cells to grow and multiply. This helps to slow or stop the spread of cancer cells. Hyaluronidase works by increasing the absorption of other medications in the body to help them work better. This medication may also be used treat amyloidosis, a condition that causes the buildup of a protein (amyloid) in your body. It works by reducing the buildup of this protein, which decreases symptoms. It is a combination medication that contains a monoclonal antibody. This medicine may be used for other purposes; ask your health care provider or pharmacist if you have questions. COMMON BRAND NAME(S): DARZALEX FASPRO What should I tell my care team before I take this medication? They need to know if you have any of these conditions: Heart disease Infection, such as chickenpox, cold sores, herpes, hepatitis B Lung or breathing disease An unusual or allergic reaction to daratumumab, hyaluronidase, other medications, foods, dyes, or preservatives Pregnant or trying to get pregnant Breast-feeding How should I use this medication? This medication is injected under the skin. It is given by your care team in a hospital or clinic setting. Talk to your care team about the use of this medication in children. Special care may be needed. Overdosage: If you think you have taken too much of this medicine contact a poison control center or emergency room at once. NOTE: This medicine is only for you. Do not share this medicine with others. What if I miss a dose? Keep appointments for follow-up doses. It is important not to miss your dose. Call your care team if you are unable to keep an appointment. What may interact with this medication? Interactions have not been studied. This list  may not describe all possible interactions. Give your health care provider a list of all the medicines, herbs, non-prescription drugs, or dietary supplements you use. Also tell them if you smoke, drink alcohol, or use illegal drugs. Some items may interact with your medicine. What should I watch for while using this medication? Your condition will be monitored carefully while you are receiving this medication. This medication can cause serious allergic reactions. To reduce your risk, your care team may give you other medication to take before receiving this one. Be sure to follow the directions from your care team. This medication can affect the results of blood tests to match your blood type. These changes can last for up to 6 months after the final dose. Your care team will do blood tests to match your blood type before you start treatment. Tell all of your care team that you are being treated with this medication before receiving a blood transfusion. This medication can affect the results of some tests used to determine treatment response; extra tests may be needed to evaluate response. Talk to your care team if you wish to become pregnant or think you are pregnant. This medication can cause serious birth defects if taken during pregnancy and for 3 months after the last dose. A reliable form of contraception is recommended while taking this medication and for 3 months after the last dose. Talk to your care team about effective forms of contraception. Do not breast-feed while taking this medication. What side effects may I notice from receiving this medication? Side effects that you should report to your care team as soon as   possible: Allergic reactions--skin rash, itching, hives, swelling of the face, lips, tongue, or throat Heart rhythm changes--fast or irregular heartbeat, dizziness, feeling faint or lightheaded, chest pain, trouble breathing Infection--fever, chills, cough, sore throat, wounds that  don't heal, pain or trouble when passing urine, general feeling of discomfort or being unwell Infusion reactions--chest pain, shortness of breath or trouble breathing, feeling faint or lightheaded Sudden eye pain or change in vision such as blurry vision, seeing halos around lights, vision loss Unusual bruising or bleeding Side effects that usually do not require medical attention (report to your care team if they continue or are bothersome): Constipation Diarrhea Fatigue Nausea Pain, tingling, or numbness in the hands or feet Swelling of the ankles, hands, or feet This list may not describe all possible side effects. Call your doctor for medical advice about side effects. You may report side effects to FDA at 1-800-FDA-1088. Where should I keep my medication? This medication is given in a hospital or clinic. It will not be stored at home. NOTE: This sheet is a summary. It may not cover all possible information. If you have questions about this medicine, talk to your doctor, pharmacist, or health care provider.  2023 Elsevier/Gold Standard (2022-02-24 00:00:00)  

## 2022-11-05 DIAGNOSIS — R768 Other specified abnormal immunological findings in serum: Secondary | ICD-10-CM | POA: Insufficient documentation

## 2022-11-09 ENCOUNTER — Other Ambulatory Visit: Payer: Self-pay | Admitting: *Deleted

## 2022-11-09 MED ORDER — LACTULOSE 10 GM/15ML PO SOLN: ORAL | 1 refills | Status: DC

## 2022-11-11 NOTE — Progress Notes (Signed)
The patient has been taking dexamethasone at home prior to daratumumab injections. After discussion with the provider, the offset time of daratumumab was changed to 30 minutes, starting 11/12/2022, in order to reduce patient wait time. A calendar was not sent to the patient because he has already been taking dexamethasone at home since July 2023.  Laray Anger, PharmD PGY-2 Pharmacy Resident Hematology/Oncology [phone]  11/11/2022 3:22 PM

## 2022-11-12 ENCOUNTER — Inpatient Hospital Stay: Payer: Medicare Other

## 2022-11-12 ENCOUNTER — Encounter: Payer: Self-pay | Admitting: Hematology & Oncology

## 2022-11-12 ENCOUNTER — Inpatient Hospital Stay (HOSPITAL_BASED_OUTPATIENT_CLINIC_OR_DEPARTMENT_OTHER): Payer: Medicare Other | Admitting: Hematology & Oncology

## 2022-11-12 ENCOUNTER — Other Ambulatory Visit (HOSPITAL_BASED_OUTPATIENT_CLINIC_OR_DEPARTMENT_OTHER): Payer: Self-pay

## 2022-11-12 VITALS — BP 134/87 | HR 78 | Temp 98.3°F | Resp 20 | Ht 70.0 in | Wt 194.8 lb

## 2022-11-12 DIAGNOSIS — C9000 Multiple myeloma not having achieved remission: Secondary | ICD-10-CM

## 2022-11-12 DIAGNOSIS — Z5112 Encounter for antineoplastic immunotherapy: Secondary | ICD-10-CM | POA: Diagnosis not present

## 2022-11-12 LAB — CBC WITH DIFFERENTIAL (CANCER CENTER ONLY)
Abs Immature Granulocytes: 0.04 10*3/uL (ref 0.00–0.07)
Basophils Absolute: 0.1 10*3/uL (ref 0.0–0.1)
Basophils Relative: 1 %
Eosinophils Absolute: 0.3 10*3/uL (ref 0.0–0.5)
Eosinophils Relative: 2 %
HCT: 41.9 % (ref 39.0–52.0)
Hemoglobin: 13.6 g/dL (ref 13.0–17.0)
Immature Granulocytes: 0 %
Lymphocytes Relative: 11 %
Lymphs Abs: 1.1 10*3/uL (ref 0.7–4.0)
MCH: 28.7 pg (ref 26.0–34.0)
MCHC: 32.5 g/dL (ref 30.0–36.0)
MCV: 88.4 fL (ref 80.0–100.0)
Monocytes Absolute: 0.7 10*3/uL (ref 0.1–1.0)
Monocytes Relative: 6 %
Neutro Abs: 8.3 10*3/uL — ABNORMAL HIGH (ref 1.7–7.7)
Neutrophils Relative %: 80 %
Platelet Count: 261 10*3/uL (ref 150–400)
RBC: 4.74 MIL/uL (ref 4.22–5.81)
RDW: 13.3 % (ref 11.5–15.5)
WBC Count: 10.4 10*3/uL (ref 4.0–10.5)
nRBC: 0 % (ref 0.0–0.2)

## 2022-11-12 LAB — CMP (CANCER CENTER ONLY)
ALT: 9 U/L (ref 0–44)
AST: 10 U/L — ABNORMAL LOW (ref 15–41)
Albumin: 4.5 g/dL (ref 3.5–5.0)
Alkaline Phosphatase: 57 U/L (ref 38–126)
Anion gap: 12 (ref 5–15)
BUN: 11 mg/dL (ref 8–23)
CO2: 24 mmol/L (ref 22–32)
Calcium: 9.2 mg/dL (ref 8.9–10.3)
Chloride: 105 mmol/L (ref 98–111)
Creatinine: 1.27 mg/dL — ABNORMAL HIGH (ref 0.61–1.24)
GFR, Estimated: >60 mL/min (ref >60)
Glucose, Bld: 124 mg/dL — ABNORMAL HIGH (ref 70–99)
Potassium: 4.4 mmol/L (ref 3.5–5.1)
Sodium: 141 mmol/L (ref 135–145)
Total Bilirubin: 0.8 mg/dL (ref 0.3–1.2)
Total Protein: 6.7 g/dL (ref 6.5–8.1)

## 2022-11-12 LAB — LACTATE DEHYDROGENASE: LDH: 122 U/L (ref 98–192)

## 2022-11-12 MED ORDER — ACETAMINOPHEN 325 MG PO TABS
650.0000 mg | ORAL_TABLET | Freq: Once | ORAL | Status: DC
  Filled 2022-11-12: qty 2

## 2022-11-12 MED ORDER — DARATUMUMAB-HYALURONIDASE-FIHJ 1800-30000 MG-UT/15ML ~~LOC~~ SOLN
1800.0000 mg | Freq: Once | SUBCUTANEOUS | Status: AC
  Administered 2022-11-12: 1800 mg via SUBCUTANEOUS
  Filled 2022-11-12: qty 15

## 2022-11-12 MED ORDER — DIPHENHYDRAMINE HCL 25 MG PO CAPS
50.0000 mg | ORAL_CAPSULE | Freq: Once | ORAL | Status: AC
  Administered 2022-11-12: 50 mg via ORAL
  Filled 2022-11-12: qty 2

## 2022-11-12 MED ORDER — ACETAMINOPHEN 325 MG PO TABS
650.0000 mg | ORAL_TABLET | Freq: Once | ORAL | Status: AC
  Administered 2022-11-12: 650 mg via ORAL

## 2022-11-12 MED ORDER — DIPHENHYDRAMINE HCL 25 MG PO CAPS: 50.0000 mg | ORAL_CAPSULE | Freq: Once | ORAL | Status: DC

## 2022-11-12 MED ORDER — BORTEZOMIB CHEMO SQ INJECTION 3.5 MG (2.5MG/ML)
1.3000 mg/m2 | Freq: Once | INTRAMUSCULAR | Status: AC
  Administered 2022-11-12: 2.75 mg via SUBCUTANEOUS
  Filled 2022-11-12: qty 1.1

## 2022-11-12 NOTE — Progress Notes (Signed)
Hematology and Oncology Follow Up Visit  Barry Gray [id number] 1956-10-22 66 y.o. 11/12/2022   Principle Diagnosis:  IgA Kappa myeloma -- normal cytogenetics/FISH (-)  Current Therapy:   S/p cervical spine stabilization -- 04/28/2022 Faspro/Velcade/Decadron -- s/p cycle #6-- start on 05/20/2022 Zometa 4 mg IV q 3 months -- next dose on 01/2023     Interim History:  [name] is back for follow-up.  He has his schedule for the autologous stem cell transplant.  He will go to [facility] the first week in January.  I have his Neupogen.  He will then get the high-dose melphalan on 12/02/2022.  He will have his stem cells reinfused on 12/03/2022.  He has done incredibly well.  I am so happy that he is going go to transplant so quickly.  He had a very thorough workup at [facility].  He had a PET scan done at [facility].  This showed that he had a couple active lesions in his pelvis.  Hopefully they will disappear when he has his autologous stem cell transplant.  If not, then he can was localized radiation.  His monoclonal studies for his myeloma have looked incredible.  When we last saw him, his monoclonal spike was 0.1 g/dL.  The IgA level was 58 mg/dL.  His Kappa light chain was 1.8 mg/dL.  He feels well.  He had a nice Christmas.  He has had no nausea or vomiting.  He has had no change in bowel or bladder habits.  He has had no cough or shortness of breath.  He is neck is been doing quite well.  He has had no problems with his neck.  He has little bit of stiffness every now and then.  He has had no rashes.  There is been no problem with infections.  Overall, I would say his performance status is ECOG 0.    Medications:  Current Outpatient Medications:    aspirin 325 MG tablet, Take 325 mg by mouth daily., Disp: , Rfl:    dexamethasone (DECADRON) 4 MG tablet, Take 5 tablets (20 mg total) by mouth once a week. Start on 05/20/2022, Disp: 100 tablet, Rfl: 3   famciclovir (FAMVIR) 250 MG tablet, Take  1 tablet (250 mg total) by mouth daily., Disp: 30 tablet, Rfl: 12   glipiZIDE (GLUCOTROL) 5 MG tablet, Take 1 tablet (5 mg total) by mouth 2 (two) times daily before a meal., Disp: 60 tablet, Rfl: 4   lactulose (CHRONULAC) 10 GM/15ML solution, TAKE 20 MLS BY MOUTH EVERY 8 HOURS AS NEEDED, Disp: 473 mL, Rfl: 1   metFORMIN (GLUCOPHAGE) 500 MG tablet, Take 500 mg by mouth 2 (two) times daily with a meal., Disp: , Rfl:    tamsulosin (FLOMAX) 0.4 MG CAPS capsule, Take 0.4 mg by mouth daily., Disp: , Rfl:    lisinopril (ZESTRIL) 10 MG tablet, Take 10 mg by mouth daily as needed (blood pressure of 160/85). (Patient not taking: Reported on 10/22/2022), Disp: , Rfl:    LORazepam (ATIVAN) 0.5 MG tablet, Take 1 tablet (0.5 mg total) by mouth every 6 (six) hours as needed (Nausea or vomiting). (Patient not taking: Reported on 10/22/2022), Disp: 30 tablet, Rfl: 0   ondansetron (ZOFRAN) 8 MG tablet, Take 1 tablet (8 mg total) by mouth 2 (two) times daily as needed (Nausea or vomiting). (Patient not taking: Reported on 05/24/2022), Disp: 30 tablet, Rfl: 1  Current Facility-Administered Medications:    acetaminophen (TYLENOL) tablet 650 mg, 650 mg, Oral, Once, Barry Gray,  Rudell Cobb, MD   diphenhydrAMINE (BENADRYL) capsule 50 mg, 50 mg, Oral, Once, Barry Gray, Rudell Cobb, MD  Allergies: No Known Allergies  Past Medical History, Surgical history, Social history, and Family History were reviewed and updated.  Review of Systems: Review of Systems  Constitutional: Negative.   HENT:  Negative.    Eyes: Negative.   Respiratory: Negative.    Cardiovascular: Negative.   Gastrointestinal: Negative.   Endocrine: Negative.   Genitourinary:  Positive for difficulty urinating. Negative for bladder incontinence.   Musculoskeletal:  Positive for neck pain.  Skin: Negative.   Neurological: Negative.   Hematological: Negative.   Psychiatric/Behavioral: Negative.      Physical Exam:  height is '5\' 10"'$  (1.778 m) and weight is 194  lb 12.8 oz (88.4 kg). His oral temperature is 98.3 F (36.8 C). His blood pressure is 134/87 and his pulse is 78. His respiration is 20 and oxygen saturation is 100%.   Wt Readings from Last 3 Encounters:  11/12/22 194 lb 12.8 oz (88.4 kg)  10/14/22 194 lb (88 kg)  09/16/22 196 lb (88.9 kg)  His vital signs show temperature of 97.6.  Pulse 63.  Blood pressure 140/67.  Weight is 192 pounds.  Physical Exam Vitals reviewed.  HENT:     Head: Normocephalic and atraumatic.  Eyes:     Pupils: Pupils are equal, round, and reactive to light.  Neck:     Comments: His neck exam does show the healing cervical scar on the back of the neck.  This is a little bit firm.  He has no obvious adenopathy in the neck.  Cardiovascular:     Rate and Rhythm: Normal rate and regular rhythm.     Heart sounds: Normal heart sounds.  Pulmonary:     Effort: Pulmonary effort is normal.     Breath sounds: Normal breath sounds.  Abdominal:     General: Bowel sounds are normal.     Palpations: Abdomen is soft.  Musculoskeletal:        General: No tenderness or deformity. Normal range of motion.     Cervical back: Normal range of motion.  Lymphadenopathy:     Cervical: No cervical adenopathy.  Skin:    General: Skin is warm and dry.     Findings: No erythema or rash.  Neurological:     Mental Status: He is alert and oriented to person, place, and time.  Psychiatric:        Behavior: Behavior normal.        Thought Content: Thought content normal.        Judgment: Judgment normal.      Lab Results  Component Value Date   WBC 10.4 11/12/2022   HGB 13.6 11/12/2022   HCT 41.9 11/12/2022   MCV 88.4 11/12/2022   PLT 261 11/12/2022     Chemistry      Component Value Date/Time   NA 141 11/12/2022 0930   K 4.4 11/12/2022 0930   CL 105 11/12/2022 0930   CO2 24 11/12/2022 0930   BUN 11 11/12/2022 0930   CREATININE 1.27 (H) 11/12/2022 0930      Component Value Date/Time   CALCIUM 9.2 11/12/2022 0930    ALKPHOS 57 11/12/2022 0930   AST 10 (L) 11/12/2022 0930   ALT 9 11/12/2022 0930   BILITOT 0.8 11/12/2022 0930      Impression and Plan: Mr. Klaus is a very nice 66 year old white male.  He has IgA kappa myeloma.  Most  of his myeloma is Kappa light chain.  Again, his light chains have come down incredibly well.  Again, I think the urine electrophoresis is the key showing that he had a good response.  We will go ahead with treatment today.  This will be his last treatment before transplant.  I suspect he probably will come back to see [place] sometime in mid February.  At some point, we will have to think about his maintenance therapy.  I would think that Faspro with Velcade may not be a bad idea for maintenance therapy.  He does not have any adverse cytogenetics.  I am just happy that he has done so well.  We will plan to see him back after his transplant.  [facility] will let us know when he is ready to come home.  Volanda Napoleon, MD 12/29/202310:18 AM

## 2022-11-12 NOTE — Patient Instructions (Signed)
[name]  Discharge Instructions: Thank you for choosing [facility] to provide your oncology and hematology care.   If you have a lab appointment with the [facility], please go directly to the [facility] and check in at the registration area.  Wear comfortable clothing and clothing appropriate for easy access to any Portacath or PICC line.   We strive to give you quality time with your provider. You may need to reschedule your appointment if you arrive late (15 or more minutes).  Arriving late affects you and other patients whose appointments are after yours.  Also, if you miss three or more appointments without notifying the office, you may be dismissed from the clinic at the provider's discretion.      For prescription refill requests, have your pharmacy contact our office and allow 72 hours for refills to be completed.    Today you received the following chemotherapy and/or immunotherapy agents velcade, darzalex      To help prevent nausea and vomiting after your treatment, we encourage you to take your nausea medication as directed.  BELOW ARE SYMPTOMS THAT SHOULD BE REPORTED IMMEDIATELY: *FEVER GREATER THAN 100.4 F (38 C) OR HIGHER *CHILLS OR SWEATING *NAUSEA AND VOMITING THAT IS NOT CONTROLLED WITH YOUR NAUSEA MEDICATION *UNUSUAL SHORTNESS OF BREATH *UNUSUAL BRUISING OR BLEEDING *URINARY PROBLEMS (pain or burning when urinating, or frequent urination) *BOWEL PROBLEMS (unusual diarrhea, constipation, pain near the anus) TENDERNESS IN MOUTH AND THROAT WITH OR WITHOUT PRESENCE OF ULCERS (sore throat, sores in mouth, or a toothache) UNUSUAL RASH, SWELLING OR PAIN  UNUSUAL VAGINAL DISCHARGE OR ITCHING   Items with * indicate a potential emergency and should be followed up as soon as possible or go to the Emergency Department if any problems should occur.  Please show the CHEMOTHERAPY ALERT CARD or IMMUNOTHERAPY ALERT CARD at check-in  to the Emergency Department and triage nurse. Should you have questions after your visit or need to cancel or reschedule your appointment, please contact [facility]  [phone] and follow the prompts.  Office hours are 8:00 a.m. to 4:30 p.m. Monday - Friday. Please note that voicemails left after 4:00 p.m. may not be returned until the following business day.  We are closed weekends and major holidays. You have access to a nurse at all times for urgent questions. Please call the main number to the clinic [phone] and follow the prompts.  For any non-urgent questions, you may also contact your provider using MyChart. We now offer e-Visits for anyone 27 and older to request care online for non-urgent symptoms. For details visit [email or web address].   Also download the MyChart app! Go to the app store, search "MyChart", open the app, select [name], and log in with your MyChart username and password.

## 2022-11-13 LAB — IGG, IGA, IGM
IgA: 103 mg/dL (ref 61–437)
IgG (Immunoglobin G), Serum: 271 mg/dL — ABNORMAL LOW (ref 603–1613)
IgM (Immunoglobulin M), Srm: 13 mg/dL — ABNORMAL LOW (ref 20–172)

## 2022-11-16 ENCOUNTER — Encounter: Payer: Self-pay | Admitting: *Deleted

## 2022-11-16 LAB — KAPPA/LAMBDA LIGHT CHAINS
Kappa free light chain: 33.7 mg/L — ABNORMAL HIGH (ref 3.3–19.4)
Kappa, lambda light chain ratio: 7.84 — ABNORMAL HIGH (ref 0.26–1.65)
Lambda free light chains: 4.3 mg/L — ABNORMAL LOW (ref 5.7–26.3)

## 2022-11-16 NOTE — Progress Notes (Signed)
Received his last treatment prior to his planned transplant on 12/02/22.  Oncology Nurse Navigator Documentation     11/16/2022    8:45 AM  Oncology Nurse Navigator Flowsheets  Navigator Follow Up Date: 12/02/2022  Navigator Follow Up Reason: Other:  Navigator Location [facility]  Navigator Encounter Type Appt/Treatment Plan Review  Patient Visit Type MedOnc  Treatment Phase Active Tx  Barriers/Navigation Needs No Barriers At This Time  Interventions None Required  Acuity Level 1-No Barriers  Support Groups/Services Friends and Family  Time Spent with Patient 15

## 2022-11-18 ENCOUNTER — Encounter: Payer: Self-pay | Admitting: *Deleted

## 2022-11-19 LAB — PROTEIN ELECTROPHORESIS, SERUM, WITH REFLEX
A/G Ratio: 1.3 (ref 0.7–1.7)
Albumin ELP: 3.5 g/dL (ref 2.9–4.4)
Alpha-1-Globulin: 0.3 g/dL (ref 0.0–0.4)
Alpha-2-Globulin: 1.2 g/dL — ABNORMAL HIGH (ref 0.4–1.0)
Beta Globulin: 1 g/dL (ref 0.7–1.3)
Gamma Globulin: 0.3 g/dL — ABNORMAL LOW (ref 0.4–1.8)
Globulin, Total: 2.8 g/dL (ref 2.2–3.9)
M-Spike, %: 0.1 g/dL — ABNORMAL HIGH
SPEP Interpretation: 0
Total Protein ELP: 6.3 g/dL (ref 6.0–8.5)

## 2022-11-19 LAB — IMMUNOFIXATION REFLEX, SERUM
IgA: 106 mg/dL (ref 61–437)
IgG (Immunoglobin G), Serum: 271 mg/dL — ABNORMAL LOW (ref 603–1613)
IgM (Immunoglobulin M), Srm: 15 mg/dL — ABNORMAL LOW (ref 20–172)

## 2022-11-25 ENCOUNTER — Encounter: Payer: Self-pay | Admitting: Hematology & Oncology

## 2022-11-25 ENCOUNTER — Other Ambulatory Visit: Payer: Self-pay | Admitting: *Deleted

## 2022-11-25 ENCOUNTER — Inpatient Hospital Stay: Payer: Medicare Other | Attending: Hematology & Oncology

## 2022-11-25 DIAGNOSIS — C9000 Multiple myeloma not having achieved remission: Secondary | ICD-10-CM

## 2022-11-25 MED ORDER — LACTULOSE 10 GM/15ML PO SOLN: ORAL | 2 refills | Status: DC

## 2022-11-25 NOTE — Progress Notes (Signed)
Patient presented with blood clotted around the insertion site of pheresis catheter. Clear tegaderm dressing was adhered to glue over the insertion site. Old dressing removed, glue came with it, blood cleaned using sterile technique, redressed, with port dressing and a biopatch. Photo taken.

## 2022-11-26 ENCOUNTER — Other Ambulatory Visit: Payer: Self-pay

## 2022-11-26 ENCOUNTER — Encounter: Payer: Self-pay | Admitting: *Deleted

## 2022-11-26 ENCOUNTER — Inpatient Hospital Stay: Payer: Medicare Other

## 2022-11-26 NOTE — Progress Notes (Signed)
See notes

## 2022-11-26 NOTE — Progress Notes (Signed)
Call placed to patient's wife yesterday afternoon to instruct her to bring pt in today at 2:00PM for dressing change.  Pt.'s wife stated at the time of that call that pheresis catheter was slightly bleeding after returning home from this office to have dressing changed.  Pt.'s wife instructed to bring pt in at 0800 today to have pheresis catheter dressing checked.  On arrival to office, gauze dressing to pheresis catheter is saturated with blood.  Dressing re-enforced and call placed to IR at [facility].  Per Vicente Males in IR at [facility], pt is to come to IR now at the [facility].  Pt.'s wife instructed to take pt to IR at [facility] now.  Pt.'s wife states that they will head to IR at [facility] now.

## 2022-11-27 ENCOUNTER — Encounter (HOSPITAL_COMMUNITY): Payer: Self-pay | Admitting: Emergency Medicine

## 2022-11-27 ENCOUNTER — Other Ambulatory Visit: Payer: Self-pay

## 2022-11-27 ENCOUNTER — Emergency Department (HOSPITAL_COMMUNITY)
Admission: EM | Admit: 2022-11-27 | Discharge: 2022-11-27 | Disposition: A | Discharge status: Home / Self Care | Payer: Medicare Other | Attending: Emergency Medicine | Admitting: Emergency Medicine

## 2022-11-27 ENCOUNTER — Encounter: Payer: Self-pay | Admitting: Hematology & Oncology

## 2022-11-27 DIAGNOSIS — T82898A Other specified complication of vascular prosthetic devices, implants and grafts, initial encounter: Secondary | ICD-10-CM | POA: Diagnosis not present

## 2022-11-27 DIAGNOSIS — Z7982 Long term (current) use of aspirin: Secondary | ICD-10-CM | POA: Insufficient documentation

## 2022-11-27 DIAGNOSIS — Z7984 Long term (current) use of oral hypoglycemic drugs: Secondary | ICD-10-CM | POA: Insufficient documentation

## 2022-11-27 DIAGNOSIS — E119 Type 2 diabetes mellitus without complications: Secondary | ICD-10-CM | POA: Insufficient documentation

## 2022-11-27 DIAGNOSIS — T82514A Breakdown (mechanical) of infusion catheter, initial encounter: Secondary | ICD-10-CM

## 2022-11-27 LAB — CBC WITH DIFFERENTIAL/PLATELET
Abs Immature Granulocytes: 0.85 10*3/uL — ABNORMAL HIGH (ref 0.00–0.07)
Basophils Absolute: 0.1 10*3/uL (ref 0.0–0.1)
Basophils Relative: 1 %
Eosinophils Absolute: 0.1 10*3/uL (ref 0.0–0.5)
Eosinophils Relative: 1 %
HCT: 36.7 % — ABNORMAL LOW (ref 39.0–52.0)
Hemoglobin: 11.8 g/dL — ABNORMAL LOW (ref 13.0–17.0)
Immature Granulocytes: 5 %
Lymphocytes Relative: 6 %
Lymphs Abs: 1.1 10*3/uL (ref 0.7–4.0)
MCH: 28.6 pg (ref 26.0–34.0)
MCHC: 32.2 g/dL (ref 30.0–36.0)
MCV: 89.1 fL (ref 80.0–100.0)
Monocytes Absolute: 1.4 10*3/uL — ABNORMAL HIGH (ref 0.1–1.0)
Monocytes Relative: 7 %
Neutro Abs: 15.5 10*3/uL — ABNORMAL HIGH (ref 1.7–7.7)
Neutrophils Relative %: 80 %
Platelets: 129 10*3/uL — ABNORMAL LOW (ref 150–400)
RBC: 4.12 MIL/uL — ABNORMAL LOW (ref 4.22–5.81)
RDW: 13.7 % (ref 11.5–15.5)
WBC: 19 10*3/uL — ABNORMAL HIGH (ref 4.0–10.5)
nRBC: 0 % (ref 0.0–0.2)

## 2022-11-27 LAB — COMPREHENSIVE METABOLIC PANEL
ALT: 12 U/L (ref 0–44)
AST: 14 U/L — ABNORMAL LOW (ref 15–41)
Albumin: 3.7 g/dL (ref 3.5–5.0)
Alkaline Phosphatase: 103 U/L (ref 38–126)
Anion gap: 10 (ref 5–15)
BUN: 10 mg/dL (ref 8–23)
CO2: 22 mmol/L (ref 22–32)
Calcium: 8.6 mg/dL — ABNORMAL LOW (ref 8.9–10.3)
Chloride: 107 mmol/L (ref 98–111)
Creatinine, Ser: 1.16 mg/dL (ref 0.61–1.24)
GFR, Estimated: >60 mL/min (ref >60)
Glucose, Bld: 167 mg/dL — ABNORMAL HIGH (ref 70–99)
Potassium: 3.8 mmol/L (ref 3.5–5.1)
Sodium: 139 mmol/L (ref 135–145)
Total Bilirubin: 0.6 mg/dL (ref 0.3–1.2)
Total Protein: 6.4 g/dL — ABNORMAL LOW (ref 6.5–8.1)

## 2022-11-27 MED ORDER — SODIUM CHLORIDE 0.9 % IV SOLN
20.0000 ug | Freq: Once | INTRAVENOUS | Status: AC
  Administered 2022-11-27: 20 ug via INTRAVENOUS
  Filled 2022-11-27: qty 5

## 2022-11-27 MED ORDER — LIDOCAINE-EPINEPHRINE (PF) 2 %-1:200000 IJ SOLN
10.0000 mL | Freq: Once | INTRAMUSCULAR | Status: AC
  Administered 2022-11-27: 10 mL
  Filled 2022-11-27: qty 20

## 2022-11-27 MED ORDER — TRANEXAMIC ACID FOR EPISTAXIS
500.0000 mg | Freq: Once | TOPICAL | Status: AC
  Administered 2022-11-27: 500 mg via TOPICAL
  Filled 2022-11-27: qty 10

## 2022-11-27 MED ORDER — THROMBIN 20000 UNITS EX KIT
20000.0000 [IU] | PACK | Freq: Once | CUTANEOUS | Status: AC
  Administered 2022-11-27: 20000 [IU] via TOPICAL
  Filled 2022-11-27: qty 1

## 2022-11-27 NOTE — ED Notes (Addendum)
Pt's port is still tender to touch and was actively bleeding. Not red or warm to touch. Gauze was removed and area cleaned thoroughly. Gauze replaced.

## 2022-11-27 NOTE — ED Notes (Signed)
Assisted Paramedic with changing of pt bandage. Pts port actively bleeding at this time. Pt reports area is still very tender to touch. Area is still bruised with what appears to be a new suture in place. The area is not warm to touch at this time or erythematous. Will continue to monitor.

## 2022-11-27 NOTE — ED Provider Notes (Incomplete)
Patient is a very pleasant 67 year old male presenting today with ongoing oozing from the insertion site of his Hickman catheter.  The catheter was placed on Tuesday of this week but did not start bleeding until his a pheresis on Wednesday evening.  He was seen on Friday due to the ongoing bleeding and had a stitch placed but reports that by the time he tried to go to bed last night it was bleeding again saturating multiple gauze.  He spoke with [facility] today who recommended he come here for further care.  Patient's hemoglobin is still stable, platelet count is 129 and CMP is within normal limits.  Patient is well-appearing but does have a constant ooze from his Hickman catheter.  Will attempt thrombin spray but also concerned that he may have platelet dysfunction and may benefit from DDAVP.  Will discuss this with his specialist at [facility] to see if this is an option and that they are amenable to this plan.

## 2022-11-27 NOTE — ED Provider Notes (Signed)
[facility] DEPT Provider Note   CSN: [id number] Arrival date & time: 11/27/22  0553     History  Chief Complaint  Patient presents with   Vascular Access Problem    Barry Gray is a 67 y.o. male with PMH significant for multiple myeloma and well-controlled diabetes presenting with bleeding from his Hickman catheter.  Patient had the catheter placed at [facility] on 1/9 in preparation for an upcoming stem cell procedure for his multiple myeloma.  Developed bleeding from the site after his pheresis on Wednesday. He was seen back at [facility] yesterday, at which time an additional stitch was placed.  This seemed to control the bleeding briefly, but overnight it started bleeding again.  Over the past 6 hours has saturated >10 gauze dressings. Patient denies dizziness, lightheadedness, syncope, palpitations, shortness of breath, or any other symptoms. Not on any blood thinners.  Previously on full dose aspirin but has not taken in 1 week.     Home Medications Prior to Admission medications   Medication Sig Start Date End Date Taking? Authorizing Provider  aspirin 325 MG tablet Take 325 mg by mouth daily.    [provider]  dexamethasone (DECADRON) 4 MG tablet Take 5 tablets (20 mg total) by mouth once a week. Start on 05/20/2022 08/10/22   Volanda Napoleon, MD  famciclovir (FAMVIR) 250 MG tablet Take 1 tablet (250 mg total) by mouth daily. 05/07/22   Volanda Napoleon, MD  glipiZIDE (GLUCOTROL) 5 MG tablet Take 1 tablet (5 mg total) by mouth 2 (two) times daily before a meal. 10/14/22   Ennever, Rudell Cobb, MD  lactulose (CHRONULAC) 10 GM/15ML solution TAKE 20 MLS BY MOUTH EVERY 8 HOURS AS NEEDED 11/25/22   Volanda Napoleon, MD  lisinopril (ZESTRIL) 10 MG tablet Take 10 mg by mouth daily as needed (blood pressure of 160/85). Patient not taking: Reported on 10/22/2022 04/16/22   [provider]  LORazepam (ATIVAN) 0.5 MG tablet Take 1 tablet (0.5 mg total)  by mouth every 6 (six) hours as needed (Nausea or vomiting). Patient not taking: Reported on 10/22/2022 05/07/22   Volanda Napoleon, MD  metFORMIN (GLUCOPHAGE) 500 MG tablet Take 500 mg by mouth 2 (two) times daily with a meal.    [provider]  ondansetron (ZOFRAN) 8 MG tablet Take 1 tablet (8 mg total) by mouth 2 (two) times daily as needed (Nausea or vomiting). Patient not taking: Reported on 05/24/2022 05/07/22   Volanda Napoleon, MD  tamsulosin (FLOMAX) 0.4 MG CAPS capsule Take 0.4 mg by mouth daily. 05/06/22   [provider]      Allergies    Patient has no known allergies.    Review of Systems   Review of Systems  Constitutional:  Negative for fever.  Cardiovascular:  Negative for chest pain and palpitations.  Gastrointestinal:  Negative for nausea and vomiting.  Skin:  Negative for color change.  Neurological:  Negative for dizziness, syncope and light-headedness.    Physical Exam Updated Vital Signs BP (!) 156/82   Pulse 80   Temp 98.4 F (36.9 C) (Oral)   Resp 18   Ht '5\' 10"'$  (1.778 m)   Wt 90.7 kg   SpO2 98%   BMI 28.70 kg/m  Physical Exam Constitutional:      General: He is not in acute distress.    Appearance: Normal appearance.  HENT:     Head: Normocephalic and atraumatic.  Eyes:  Conjunctiva/sclera: Conjunctivae normal.  Cardiovascular:     Rate and Rhythm: Normal rate and regular rhythm.     Heart sounds: Normal heart sounds.  Pulmonary:     Effort: Pulmonary effort is normal.  Abdominal:     Palpations: Abdomen is soft.  Skin:    General: Skin is warm and dry.     Capillary Refill: Capillary refill takes less than 2 seconds.     Comments: Double-lumen Hickman catheter in place on right anterior chest wall with active, constant oozing from insertion site.  4x4 gauze dressing which had been placed 30 minutes prior is fully saturated with blood.  No increased warmth or streaking erythema surrounding the site.  Neurological:      General: No focal deficit present.     Mental Status: He is alert. Mental status is at baseline.     ED Results / Procedures / Treatments   Labs (all labs ordered are listed, but only abnormal results are displayed) Labs Reviewed  CBC WITH DIFFERENTIAL/PLATELET - Abnormal; Notable for the following components:      Result Value   WBC 19.0 (*)    RBC 4.12 (*)    Hemoglobin 11.8 (*)    HCT 36.7 (*)    Platelets 129 (*)    Neutro Abs 15.5 (*)    Monocytes Absolute 1.4 (*)    Abs Immature Granulocytes 0.85 (*)    All other components within normal limits  COMPREHENSIVE METABOLIC PANEL - Abnormal; Notable for the following components:   Glucose, Bld 167 (*)    Calcium 8.6 (*)    Total Protein 6.4 (*)    AST 14 (*)    All other components within normal limits    EKG None  Radiology No results found.  Procedures Procedures    Medications Ordered in ED Medications  thrombin spray 20,000 Units (20,000 Units Topical Given 11/27/22 0857)  desmopressin (DDAVP) 20 mcg in sodium chloride 0.9 % 50 mL IVPB (0 mcg Intravenous Stopped 11/27/22 1111)  lidocaine-EPINEPHrine (XYLOCAINE W/EPI) 2 %-1:200000 (PF) injection 10 mL (10 mLs Infiltration Given by Other 11/27/22 1100)  tranexamic acid (CYKLOKAPRON) 1000 MG/10ML topical solution 500 mg (500 mg Topical Given by Other 11/27/22 1448)    ED Course/ Medical Decision Making/ A&P   {                          Medical Decision Making Amount and/or Complexity of Data Reviewed Labs: ordered.  Risk Prescription drug management.   This is a 67 year old male with PMH significant for multiple myeloma who presents with bleeding from his Hickman catheter, which was placed earlier this week at [facility] in preparation for upcoming stem cell therapy. On exam he has a catheter in place on right anterior chest with active oozing from the site, saturating multiple gauze dressings in the ED.  Reassuringly, his hemoglobin is stable at 11.8 and platelets  only mildly reduced at 129.  Suspect there is a degree of underlying platelet dysfunction, may benefit from dose of DDAVP.  Spoke with patient's primary oncologist, Dr. Mylinda Latina, at [facility] who is in agreement with this plan.  Dr Laverta Baltimore recommends removing the catheter if no improvement after DDAVP and thrombin spray, and they will replace as indicated next week.  Re-evaluated patient after receiving DDAVP and thrombin spray.  Bleeding has slowed significantly.  Will plan to monitor for another 30 minutes and if no further bleeding plan to discharge home  with close follow-up at [facility].  Unfortunately patient still with slight oozing from catheter site despite DDAVP, thrombin spray, and 1% lido with epi. Dr. Maryan Rued was able to replace the prior sutures, he was monitored for 1 additional hour, and bleeding now seems to be controlled. Tegaderm placed over catheter insertion site and gauze dressing applied. Patient stable for discharge home with close follow up at [facility].  Final Clinical Impression(s) / ED Diagnoses Final diagnoses:  Hickman catheter dysfunction, initial encounter [facility])    Rx / DC Orders ED Discharge Orders     None         Alcus Dad, MD 11/27/22 1537    Blanchie Dessert, MD 11/28/22 [phone]

## 2022-11-27 NOTE — ED Triage Notes (Addendum)
Bleeding around port to R chest. Was seen on Friday at [facility] where the surgeon who placed it added a stitch to the site.  Originally placed 1/9 and has been having bleeding problem since then.  Denies dizziness  He has the port for stem cell procedure for multiple myeloma.   Bleeding appears controlled in triage, continues to ooze.

## 2022-11-27 NOTE — Discharge Instructions (Addendum)
You were seen today for bleeding from your catheter site. Your hemoglobin level was stable. You were treated with several medications to stop the bleeding and the stitches were replaced. Please follow up with your care team at [facility].

## 2022-11-28 DIAGNOSIS — Z5111 Encounter for antineoplastic chemotherapy: Secondary | ICD-10-CM | POA: Insufficient documentation

## 2022-12-03 ENCOUNTER — Encounter: Payer: Self-pay | Admitting: *Deleted

## 2022-12-03 ENCOUNTER — Other Ambulatory Visit: Payer: Self-pay

## 2022-12-03 NOTE — Progress Notes (Signed)
Patient undergoing transplant today. Will wait until patient is released from [facility] and ready for follow up in this office.   Oncology Nurse Navigator Documentation     12/03/2022   11:30 AM  Oncology Nurse Navigator Flowsheets  Navigator Location [facility]  Navigator Encounter Type Appt/Treatment Plan Review  Patient Visit Type MedOnc  Treatment Phase Active Tx  Barriers/Navigation Needs No Barriers At This Time  Interventions None Required  Acuity Level 1-No Barriers  Support Groups/Services Friends and Family  Time Spent with Patient 15

## 2022-12-05 DIAGNOSIS — Z9481 Bone marrow transplant status: Secondary | ICD-10-CM | POA: Insufficient documentation

## 2022-12-05 DIAGNOSIS — R638 Other symptoms and signs concerning food and fluid intake: Secondary | ICD-10-CM | POA: Insufficient documentation

## 2022-12-06 ENCOUNTER — Other Ambulatory Visit: Payer: Self-pay

## 2022-12-13 DIAGNOSIS — D709 Neutropenia, unspecified: Secondary | ICD-10-CM | POA: Insufficient documentation

## 2022-12-19 ENCOUNTER — Other Ambulatory Visit: Payer: Self-pay

## 2022-12-20 ENCOUNTER — Other Ambulatory Visit: Payer: Self-pay | Admitting: *Deleted

## 2022-12-20 DIAGNOSIS — C9000 Multiple myeloma not having achieved remission: Secondary | ICD-10-CM

## 2022-12-21 ENCOUNTER — Encounter: Payer: Self-pay | Admitting: Hematology & Oncology

## 2022-12-21 ENCOUNTER — Other Ambulatory Visit: Payer: Self-pay

## 2022-12-21 ENCOUNTER — Inpatient Hospital Stay (HOSPITAL_BASED_OUTPATIENT_CLINIC_OR_DEPARTMENT_OTHER): Payer: Medicare Other | Admitting: Hematology & Oncology

## 2022-12-21 ENCOUNTER — Inpatient Hospital Stay: Payer: Medicare Other | Attending: Hematology & Oncology

## 2022-12-21 VITALS — BP 96/69 | HR 97 | Temp 98.1°F | Resp 19 | Ht 70.0 in | Wt 197.0 lb

## 2022-12-21 DIAGNOSIS — C9000 Multiple myeloma not having achieved remission: Secondary | ICD-10-CM | POA: Insufficient documentation

## 2022-12-21 DIAGNOSIS — Z9484 Stem cells transplant status: Secondary | ICD-10-CM | POA: Diagnosis not present

## 2022-12-21 LAB — CMP (CANCER CENTER ONLY)
ALT: 40 U/L (ref 0–44)
AST: 16 U/L (ref 15–41)
Albumin: 3.2 g/dL — ABNORMAL LOW (ref 3.5–5.0)
Alkaline Phosphatase: 75 U/L (ref 38–126)
Anion gap: 10 (ref 5–15)
BUN: 9 mg/dL (ref 8–23)
CO2: 26 mmol/L (ref 22–32)
Calcium: 8 mg/dL — ABNORMAL LOW (ref 8.9–10.3)
Chloride: 104 mmol/L (ref 98–111)
Creatinine: 1.06 mg/dL (ref 0.61–1.24)
GFR, Estimated: >60 mL/min (ref >60)
Glucose, Bld: 213 mg/dL — ABNORMAL HIGH (ref 70–99)
Potassium: 4.7 mmol/L (ref 3.5–5.1)
Sodium: 140 mmol/L (ref 135–145)
Total Bilirubin: 0.4 mg/dL (ref 0.3–1.2)
Total Protein: 5.4 g/dL — ABNORMAL LOW (ref 6.5–8.1)

## 2022-12-21 LAB — CBC WITH DIFFERENTIAL (CANCER CENTER ONLY)
Abs Immature Granulocytes: 0.19 10*3/uL — ABNORMAL HIGH (ref 0.00–0.07)
Basophils Absolute: 0 10*3/uL (ref 0.0–0.1)
Basophils Relative: 1 %
Eosinophils Absolute: 0 10*3/uL (ref 0.0–0.5)
Eosinophils Relative: 0 %
HCT: 31.3 % — ABNORMAL LOW (ref 39.0–52.0)
Hemoglobin: 10.2 g/dL — ABNORMAL LOW (ref 13.0–17.0)
Immature Granulocytes: 5 %
Lymphocytes Relative: 21 %
Lymphs Abs: 0.7 10*3/uL (ref 0.7–4.0)
MCH: 29.1 pg (ref 26.0–34.0)
MCHC: 32.6 g/dL (ref 30.0–36.0)
MCV: 89.2 fL (ref 80.0–100.0)
Monocytes Absolute: 0.9 10*3/uL (ref 0.1–1.0)
Monocytes Relative: 24 %
Neutro Abs: 1.8 10*3/uL (ref 1.7–7.7)
Neutrophils Relative %: 49 %
Platelet Count: 171 10*3/uL (ref 150–400)
RBC: 3.51 MIL/uL — ABNORMAL LOW (ref 4.22–5.81)
RDW: 14.6 % (ref 11.5–15.5)
WBC Count: 3.6 10*3/uL — ABNORMAL LOW (ref 4.0–10.5)
nRBC: 0 % (ref 0.0–0.2)

## 2022-12-21 LAB — MAGNESIUM: Magnesium: 2.1 mg/dL (ref 1.7–2.4)

## 2022-12-21 NOTE — Progress Notes (Signed)
Hematology and Oncology Follow Up Visit  [employer] [id number] [birth year] 67 y.o. 12/21/2022   Principle Diagnosis:  IgA Kappa myeloma -- normal cytogenetics/FISH (-)  Current Therapy:   S/p cervical spine stabilization -- 04/28/2022 Faspro/Velcade/Decadron -- s/p cycle #6-- start on 05/20/2022 Zometa 4 mg IV q 3 months -- next dose on 01/2023 Status post autologous stem cell transplant -- [facility] on 12/03/2022     Interim History:  Mr. Silverman is back for follow-up.  He did have his stem cell transplant at [facility].  Obviously, I think he is done incredibly well.  I really think that he has tolerated the transplant nicely.  I think he was hospitalized.  He has had a fever.  He obviously was neutropenic.  I am unsure if anything ever grew out from his cultures.  He looks great.  He has had no real complaints.  He has not eaten all that well.  He feels that his appetite is going to improve.  He has had no bowels or bladder issues.  There is been no diarrhea.  He has had no rashes.  He has had no bleeding.  He has had no cough or shortness of breath.  He has had no bony pain.  Overall, I would have to say that his performance status right now is probably ECOG 1.   He has had no rashes.   Medications:  Current Outpatient Medications:    acyclovir (ZOVIRAX) 400 MG tablet, Take 400 mg by mouth 2 (two) times daily., Disp: , Rfl:    calcium carbonate (TUMS EX) 750 MG chewable tablet, Chew 1 tablet by mouth 3 (three) times daily as needed for heartburn., Disp: , Rfl:    loperamide (IMODIUM) 2 MG capsule, Take 4 mg by mouth every 6 (six) hours as needed for diarrhea or loose stools., Disp: , Rfl:    LORazepam (ATIVAN) 0.5 MG tablet, Take 0.5 mg by mouth every 8 (eight) hours as needed (for nausea and vomiting)., Disp: , Rfl:    ondansetron (ZOFRAN) 8 MG tablet, Take 8 mg by mouth every 8 (eight) hours as needed for nausea or vomiting., Disp: , Rfl:    prochlorperazine (COMPAZINE) 5 MG  tablet, Take 5 mg by mouth every 6 (six) hours as needed for nausea or vomiting., Disp: , Rfl:    simethicone (MYLICON) 80 MG chewable tablet, Chew 80 mg by mouth every 6 (six) hours as needed for flatulence., Disp: , Rfl:    metFORMIN (GLUCOPHAGE) 500 MG tablet, Take 500 mg by mouth 2 (two) times daily with a meal., Disp: , Rfl:    tamsulosin (FLOMAX) 0.4 MG CAPS capsule, Take 0.4 mg by mouth daily., Disp: , Rfl:   Allergies: No Known Allergies  Past Medical History, Surgical history, Social history, and Family History were reviewed and updated.  Review of Systems: Review of Systems  Constitutional: Negative.   HENT:  Negative.    Eyes: Negative.   Respiratory: Negative.    Cardiovascular: Negative.   Gastrointestinal: Negative.   Endocrine: Negative.   Genitourinary:  Positive for difficulty urinating. Negative for bladder incontinence.   Musculoskeletal:  Positive for neck pain.  Skin: Negative.   Neurological: Negative.   Hematological: Negative.   Psychiatric/Behavioral: Negative.      Physical Exam:  height is '5\' 10"'$  (1.778 m) and weight is 197 lb (89.4 kg). His oral temperature is 98.1 F (36.7 C). His blood pressure is 96/69 and his pulse is 97. His respiration is 19 and  oxygen saturation is 100%.   Wt Readings from Last 3 Encounters:  12/21/22 197 lb (89.4 kg)  11/27/22 200 lb (90.7 kg)  11/12/22 194 lb 12.8 oz (88.4 kg)  His vital signs show temperature of 97.6.  Pulse 63.  Blood pressure 140/67.  Weight is 192 pounds.  Physical Exam Vitals reviewed.  HENT:     Head: Normocephalic and atraumatic.  Eyes:     Pupils: Pupils are equal, round, and reactive to light.  Neck:     Comments: His neck exam does show the healing cervical scar on the back of the neck.  This is a little bit firm.  He has no obvious adenopathy in the neck.  Cardiovascular:     Rate and Rhythm: Normal rate and regular rhythm.     Heart sounds: Normal heart sounds.  Pulmonary:     Effort:  Pulmonary effort is normal.     Breath sounds: Normal breath sounds.  Abdominal:     General: Bowel sounds are normal.     Palpations: Abdomen is soft.  Musculoskeletal:        General: No tenderness or deformity. Normal range of motion.     Cervical back: Normal range of motion.  Lymphadenopathy:     Cervical: No cervical adenopathy.  Skin:    General: Skin is warm and dry.     Findings: No erythema or rash.  Neurological:     Mental Status: He is alert and oriented to person, place, and time.  Psychiatric:        Behavior: Behavior normal.        Thought Content: Thought content normal.        Judgment: Judgment normal.      Lab Results  Component Value Date   WBC 3.6 (L) 12/21/2022   HGB 10.2 (L) 12/21/2022   HCT 31.3 (L) 12/21/2022   MCV 89.2 12/21/2022   PLT 171 12/21/2022     Chemistry      Component Value Date/Time   NA 140 12/21/2022 1111   K 4.7 12/21/2022 1111   CL 104 12/21/2022 1111   CO2 26 12/21/2022 1111   BUN 9 12/21/2022 1111   CREATININE 1.06 12/21/2022 1111      Component Value Date/Time   CALCIUM 8.0 (L) 12/21/2022 1111   ALKPHOS 75 12/21/2022 1111   AST 16 12/21/2022 1111   ALT 40 12/21/2022 1111   BILITOT 0.4 12/21/2022 1111      Impression and Plan: Mr. Bise is a very nice 67 year old white male.  He has IgA kappa myeloma.  Most of his myeloma is Kappa light chain.  Again, his light chains have come down incredibly well.  Again, I think the urine electrophoresis is the key, showing that he had a good response.  At this point, we will follow him along  The request from [facility].  We will check his labs weekly.  At some point, we will have to check a 24-hour urine on him since this really is where his light chains have been.  I will plan to see him back in 3 weeks.  I think he goes back to [facility] in 6 weeks.  At that point time, I am sure that we will decide how to treat him in the maintenance setting.   Volanda Napoleon,  MD 2/6/202412:43 PM

## 2022-12-22 ENCOUNTER — Other Ambulatory Visit: Payer: Self-pay

## 2022-12-23 ENCOUNTER — Telehealth: Payer: Self-pay | Admitting: *Deleted

## 2022-12-23 NOTE — Telephone Encounter (Signed)
Per scheduling message [place] patient and lvm of his upcoming appointments, requested call back to confirm.

## 2022-12-24 ENCOUNTER — Other Ambulatory Visit: Payer: Self-pay

## 2022-12-27 ENCOUNTER — Encounter: Payer: Self-pay | Admitting: *Deleted

## 2022-12-27 NOTE — Progress Notes (Signed)
Patient has done well after transplant. He will now come weekly for lab monitoring.   Appointments not correct per provider parameters. Message sent to scheduling and appointments updated.   Oncology Nurse Navigator Documentation     12/27/2022    1:30 PM  Oncology Nurse Navigator Flowsheets  Navigator Follow Up Date: 01/11/2023  Navigator Follow Up Reason: Follow-up Appointment  Navigator Location [facility]  Navigator Encounter Type Appt/Treatment Plan Review  Patient Visit Type MedOnc  Treatment Phase Active Tx  Barriers/Navigation Needs No Barriers At This Time  Interventions Coordination of Care  Acuity Level 1-No Barriers  Coordination of Care Appts  Support Groups/Services Friends and Family  Time Spent with Patient 30

## 2022-12-28 ENCOUNTER — Other Ambulatory Visit: Payer: Medicare Other

## 2022-12-28 ENCOUNTER — Inpatient Hospital Stay: Payer: Medicare Other

## 2022-12-28 ENCOUNTER — Telehealth: Payer: Self-pay | Admitting: *Deleted

## 2022-12-28 ENCOUNTER — Ambulatory Visit: Payer: Medicare Other | Admitting: Hematology & Oncology

## 2022-12-28 DIAGNOSIS — C9000 Multiple myeloma not having achieved remission: Secondary | ICD-10-CM | POA: Diagnosis not present

## 2022-12-28 LAB — CBC WITH DIFFERENTIAL (CANCER CENTER ONLY)
Abs Immature Granulocytes: 0.02 10*3/uL (ref 0.00–0.07)
Basophils Absolute: 0.2 10*3/uL — ABNORMAL HIGH (ref 0.0–0.1)
Basophils Relative: 4 %
Eosinophils Absolute: 0.1 10*3/uL (ref 0.0–0.5)
Eosinophils Relative: 2 %
HCT: 34.3 % — ABNORMAL LOW (ref 39.0–52.0)
Hemoglobin: 11.3 g/dL — ABNORMAL LOW (ref 13.0–17.0)
Immature Granulocytes: 0 %
Lymphocytes Relative: 14 %
Lymphs Abs: 0.8 10*3/uL (ref 0.7–4.0)
MCH: 29.6 pg (ref 26.0–34.0)
MCHC: 32.9 g/dL (ref 30.0–36.0)
MCV: 89.8 fL (ref 80.0–100.0)
Monocytes Absolute: 0.9 10*3/uL (ref 0.1–1.0)
Monocytes Relative: 16 %
Neutro Abs: 3.6 10*3/uL (ref 1.7–7.7)
Neutrophils Relative %: 64 %
Platelet Count: 398 10*3/uL (ref 150–400)
RBC: 3.82 MIL/uL — ABNORMAL LOW (ref 4.22–5.81)
RDW: 15.7 % — ABNORMAL HIGH (ref 11.5–15.5)
WBC Count: 5.6 10*3/uL (ref 4.0–10.5)
nRBC: 0 % (ref 0.0–0.2)

## 2022-12-28 LAB — CMP (CANCER CENTER ONLY)
ALT: 11 U/L (ref 0–44)
AST: 12 U/L — ABNORMAL LOW (ref 15–41)
Albumin: 3.6 g/dL (ref 3.5–5.0)
Alkaline Phosphatase: 59 U/L (ref 38–126)
Anion gap: 9 (ref 5–15)
BUN: 10 mg/dL (ref 8–23)
CO2: 26 mmol/L (ref 22–32)
Calcium: 8.5 mg/dL — ABNORMAL LOW (ref 8.9–10.3)
Chloride: 106 mmol/L (ref 98–111)
Creatinine: 0.99 mg/dL (ref 0.61–1.24)
GFR, Estimated: >60 mL/min (ref >60)
Glucose, Bld: 171 mg/dL — ABNORMAL HIGH (ref 70–99)
Potassium: 4.2 mmol/L (ref 3.5–5.1)
Sodium: 141 mmol/L (ref 135–145)
Total Bilirubin: 0.4 mg/dL (ref 0.3–1.2)
Total Protein: 5.5 g/dL — ABNORMAL LOW (ref 6.5–8.1)

## 2022-12-28 LAB — PHOSPHORUS: Phosphorus: 3 mg/dL (ref 2.5–4.6)

## 2022-12-28 LAB — MAGNESIUM: Magnesium: 1.6 mg/dL — ABNORMAL LOW (ref 1.7–2.4)

## 2022-12-28 NOTE — Telephone Encounter (Signed)
Called pt regarding appt for IV Mag. Pt states he is very tired, just got out of the hospital and would rather not come in. Pt asked if there is something he can take instead. MD out of office will review upon his return and call pt with additional information.

## 2022-12-28 NOTE — Telephone Encounter (Signed)
-----   Message from Volanda Napoleon, MD sent at 12/28/2022  1:07 PM EST ----- Call - he needs IV Magnesium.  Please set him up with 2 g of IV magnesium.  Thanks.  Laurey Arrow

## 2023-01-03 ENCOUNTER — Encounter: Payer: Self-pay | Admitting: Hematology & Oncology

## 2023-01-04 ENCOUNTER — Inpatient Hospital Stay: Payer: Medicare Other

## 2023-01-04 ENCOUNTER — Ambulatory Visit: Payer: Medicare Other | Admitting: Hematology & Oncology

## 2023-01-04 ENCOUNTER — Other Ambulatory Visit: Payer: Medicare Other

## 2023-01-04 ENCOUNTER — Other Ambulatory Visit: Payer: Self-pay

## 2023-01-04 ENCOUNTER — Telehealth: Payer: Self-pay

## 2023-01-04 DIAGNOSIS — C9000 Multiple myeloma not having achieved remission: Secondary | ICD-10-CM | POA: Diagnosis not present

## 2023-01-04 LAB — CBC WITH DIFFERENTIAL (CANCER CENTER ONLY)
Abs Immature Granulocytes: 0.02 10*3/uL (ref 0.00–0.07)
Basophils Absolute: 0.1 10*3/uL (ref 0.0–0.1)
Basophils Relative: 2 %
Eosinophils Absolute: 0.2 10*3/uL (ref 0.0–0.5)
Eosinophils Relative: 4 %
HCT: 36.1 % — ABNORMAL LOW (ref 39.0–52.0)
Hemoglobin: 11.8 g/dL — ABNORMAL LOW (ref 13.0–17.0)
Immature Granulocytes: 0 %
Lymphocytes Relative: 11 %
Lymphs Abs: 0.7 10*3/uL (ref 0.7–4.0)
MCH: 29.9 pg (ref 26.0–34.0)
MCHC: 32.7 g/dL (ref 30.0–36.0)
MCV: 91.6 fL (ref 80.0–100.0)
Monocytes Absolute: 1 10*3/uL (ref 0.1–1.0)
Monocytes Relative: 15 %
Neutro Abs: 4.4 10*3/uL (ref 1.7–7.7)
Neutrophils Relative %: 68 %
Platelet Count: 184 10*3/uL (ref 150–400)
RBC: 3.94 MIL/uL — ABNORMAL LOW (ref 4.22–5.81)
RDW: 16.6 % — ABNORMAL HIGH (ref 11.5–15.5)
WBC Count: 6.5 10*3/uL (ref 4.0–10.5)
nRBC: 0 % (ref 0.0–0.2)

## 2023-01-04 LAB — MAGNESIUM: Magnesium: 1.5 mg/dL — ABNORMAL LOW (ref 1.7–2.4)

## 2023-01-04 LAB — CMP (CANCER CENTER ONLY)
ALT: 8 U/L (ref 0–44)
AST: 19 U/L (ref 15–41)
Albumin: 3.3 g/dL — ABNORMAL LOW (ref 3.5–5.0)
Alkaline Phosphatase: 58 U/L (ref 38–126)
Anion gap: 10 (ref 5–15)
BUN: 9 mg/dL (ref 8–23)
CO2: 23 mmol/L (ref 22–32)
Calcium: 8.4 mg/dL — ABNORMAL LOW (ref 8.9–10.3)
Chloride: 107 mmol/L (ref 98–111)
Creatinine: 1.11 mg/dL (ref 0.61–1.24)
GFR, Estimated: >60 mL/min (ref >60)
Glucose, Bld: 142 mg/dL — ABNORMAL HIGH (ref 70–99)
Potassium: 4.6 mmol/L (ref 3.5–5.1)
Sodium: 140 mmol/L (ref 135–145)
Total Bilirubin: 0.7 mg/dL (ref 0.3–1.2)
Total Protein: 6 g/dL — ABNORMAL LOW (ref 6.5–8.1)

## 2023-01-04 LAB — PHOSPHORUS: Phosphorus: 3.2 mg/dL (ref 2.5–4.6)

## 2023-01-04 MED ORDER — LACTULOSE 10 GM/15ML PO SOLN: 10.0000 g | Freq: Three times a day (TID) | ORAL | 0 refills | Status: DC

## 2023-01-04 NOTE — Telephone Encounter (Signed)
-----   Message from Volanda Napoleon, MD sent at 01/04/2023  1:37 PM EST ----- Call and let him know that he needs to come back for IV magnesium.  He needs 2 g of IV magnesium.  Thanks.  Laurey Arrow

## 2023-01-04 NOTE — Telephone Encounter (Signed)
Called patient and informed him of mag results, patient prefers not to come in still. Discussed with MD. Patient can take magnesium oxide 440m BID over the counter. Pt confirmed and will pick that up today. Patient also requested refill on lactulose, refill request sent to MD.    Patient also inquired if he needed to come in for his appt next week on 2/27 as he sees [facility] on 2/28. Confirmed with MD, patient can cancel appt with our office on 2/27 and f/u after he sees [facility] on 2/28.

## 2023-01-09 ENCOUNTER — Other Ambulatory Visit: Payer: Self-pay | Admitting: Hematology & Oncology

## 2023-01-11 ENCOUNTER — Other Ambulatory Visit: Payer: Medicare Other

## 2023-01-11 ENCOUNTER — Ambulatory Visit: Payer: Medicare Other | Admitting: Hematology & Oncology

## 2023-01-13 DIAGNOSIS — D649 Anemia, unspecified: Secondary | ICD-10-CM | POA: Insufficient documentation

## 2023-01-14 ENCOUNTER — Telehealth: Payer: Self-pay | Admitting: *Deleted

## 2023-01-14 NOTE — Telephone Encounter (Signed)
Call received from patient stating that he had an appt at [facility] on Wednesday and needs to change his appts here at this office according to [facility], but he is not sure of when he needs to be seen.  Call placed to [place] transplant team, Lucy NP and she states that pt does not need weekly lab work at this time and can be seen monthly for lab work and to see provider.  Message sent to scheduling.

## 2023-01-15 ENCOUNTER — Other Ambulatory Visit: Payer: Self-pay

## 2023-01-18 ENCOUNTER — Other Ambulatory Visit: Payer: Medicare Other

## 2023-01-18 ENCOUNTER — Ambulatory Visit: Payer: Medicare Other | Admitting: Hematology & Oncology

## 2023-01-18 ENCOUNTER — Other Ambulatory Visit: Payer: Self-pay

## 2023-01-18 DIAGNOSIS — C9000 Multiple myeloma not having achieved remission: Secondary | ICD-10-CM

## 2023-01-18 MED ORDER — LACTULOSE 10 GM/15ML PO SOLN: 10.0000 g | Freq: Three times a day (TID) | ORAL | 0 refills | Status: DC

## 2023-01-25 ENCOUNTER — Other Ambulatory Visit: Payer: Medicare Other

## 2023-01-25 ENCOUNTER — Ambulatory Visit: Payer: Medicare Other | Admitting: Hematology & Oncology

## 2023-01-31 ENCOUNTER — Other Ambulatory Visit: Payer: Self-pay | Admitting: *Deleted

## 2023-01-31 DIAGNOSIS — C9000 Multiple myeloma not having achieved remission: Secondary | ICD-10-CM

## 2023-01-31 MED ORDER — LACTULOSE 10 GM/15ML PO SOLN: 10.0000 g | Freq: Three times a day (TID) | ORAL | 0 refills | Status: DC

## 2023-02-09 ENCOUNTER — Other Ambulatory Visit: Payer: Self-pay

## 2023-02-09 ENCOUNTER — Inpatient Hospital Stay: Payer: Medicare Other | Attending: Hematology & Oncology

## 2023-02-09 ENCOUNTER — Encounter: Payer: Self-pay | Admitting: *Deleted

## 2023-02-09 ENCOUNTER — Inpatient Hospital Stay (HOSPITAL_BASED_OUTPATIENT_CLINIC_OR_DEPARTMENT_OTHER): Payer: Medicare Other | Admitting: Hematology & Oncology

## 2023-02-09 VITALS — BP 129/75 | HR 75 | Temp 98.0°F | Resp 17 | Ht 70.0 in | Wt 176.0 lb

## 2023-02-09 DIAGNOSIS — C9000 Multiple myeloma not having achieved remission: Secondary | ICD-10-CM | POA: Insufficient documentation

## 2023-02-09 DIAGNOSIS — Z9484 Stem cells transplant status: Secondary | ICD-10-CM | POA: Diagnosis not present

## 2023-02-09 LAB — CMP (CANCER CENTER ONLY)
ALT: 6 U/L (ref 0–44)
AST: 11 U/L — ABNORMAL LOW (ref 15–41)
Albumin: 4.5 g/dL (ref 3.5–5.0)
Alkaline Phosphatase: 41 U/L (ref 38–126)
Anion gap: 13 (ref 5–15)
BUN: 10 mg/dL (ref 8–23)
CO2: 23 mmol/L (ref 22–32)
Calcium: 9.5 mg/dL (ref 8.9–10.3)
Chloride: 104 mmol/L (ref 98–111)
Creatinine: 1.12 mg/dL (ref 0.61–1.24)
GFR, Estimated: >60 mL/min (ref >60)
Glucose, Bld: 169 mg/dL — ABNORMAL HIGH (ref 70–99)
Potassium: 4.6 mmol/L (ref 3.5–5.1)
Sodium: 140 mmol/L (ref 135–145)
Total Bilirubin: 0.8 mg/dL (ref 0.3–1.2)
Total Protein: 6.4 g/dL — ABNORMAL LOW (ref 6.5–8.1)

## 2023-02-09 LAB — CBC WITH DIFFERENTIAL (CANCER CENTER ONLY)
Abs Immature Granulocytes: 0.02 10*3/uL (ref 0.00–0.07)
Basophils Absolute: 0 10*3/uL (ref 0.0–0.1)
Basophils Relative: 1 %
Eosinophils Absolute: 0.1 10*3/uL (ref 0.0–0.5)
Eosinophils Relative: 2 %
HCT: 35.8 % — ABNORMAL LOW (ref 39.0–52.0)
Hemoglobin: 12.2 g/dL — ABNORMAL LOW (ref 13.0–17.0)
Immature Granulocytes: 0 %
Lymphocytes Relative: 20 %
Lymphs Abs: 1.2 10*3/uL (ref 0.7–4.0)
MCH: 31.4 pg (ref 26.0–34.0)
MCHC: 34.1 g/dL (ref 30.0–36.0)
MCV: 92 fL (ref 80.0–100.0)
Monocytes Absolute: 0.6 10*3/uL (ref 0.1–1.0)
Monocytes Relative: 9 %
Neutro Abs: 4.2 10*3/uL (ref 1.7–7.7)
Neutrophils Relative %: 68 %
Platelet Count: 181 10*3/uL (ref 150–400)
RBC: 3.89 MIL/uL — ABNORMAL LOW (ref 4.22–5.81)
RDW: 15.5 % (ref 11.5–15.5)
WBC Count: 6.1 10*3/uL (ref 4.0–10.5)
nRBC: 0 % (ref 0.0–0.2)

## 2023-02-09 LAB — MAGNESIUM: Magnesium: 2 mg/dL (ref 1.7–2.4)

## 2023-02-09 LAB — PHOSPHORUS: Phosphorus: 4.2 mg/dL (ref 2.5–4.6)

## 2023-02-09 MED ORDER — LACTULOSE 10 GM/15ML PO SOLN: 10.0000 g | Freq: Three times a day (TID) | ORAL | 0 refills | Status: DC

## 2023-02-09 NOTE — Progress Notes (Signed)
Patient is s/p transplant and doing well. He is still followed by [facility] and they will decide when to start maintenance therapy. Will discontinue active navigation but be available to the patient as needed in the future.   Oncology Nurse Navigator Documentation     02/09/2023   11:45 AM  Oncology Nurse Navigator Flowsheets  Navigation Complete Date: 02/09/2023  Post Navigation: Continue to Follow Patient? No  Reason Not Navigating Patient: Patient On Maintenance Chemotherapy  Navigator Location [facility]  Navigator Encounter Type Appt/Treatment Plan Review  Patient Visit Type MedOnc  Treatment Phase Active Tx  Barriers/Navigation Needs No Barriers At This Time  Interventions None Required  Acuity Level 1-No Barriers  Support Groups/Services Friends and Family  Time Spent with Patient 15

## 2023-02-09 NOTE — Progress Notes (Signed)
Hematology and Oncology Follow Up Visit  Barry Gray [id number] 1956-07-22 67 y.o. 02/09/2023   Principle Diagnosis:  IgA Kappa myeloma -- normal cytogenetics/FISH (-)  Current Therapy:   S/p cervical spine stabilization -- 04/28/2022 Faspro/Velcade/Decadron -- s/p cycle #6-- start on 05/20/2022 Zometa 4 mg IV q 3 months -- next dose on 03/2023 Status post autologous stem cell transplant -- [facility] on 12/03/2022     Interim History:  [name] is back for follow-up.  He looks fantastic.  He feels good.  He has had no problems since the transplant.  He is eating well.  He has had no problems with pain.  He has had no change in bowel or bladder habits.  He has had no rashes.  There is been no bleeding.  He has had a good appetite.  There is been no cough or shortness of breath.  I think he sees Dr. Laverta Baltimore at [facility] in a couple weeks.  At that time, I am sure that Dr. Laverta Baltimore will decide how we can treat him in the maintenance setting.  When we last saw him, his monoclonal spike was 0.1 g/dL.  The IgA level was 106 mg/dL.  The Kappa light chain was 3.4 mg/dL.  Overall, I would say that his performance status is probably ECOG 0.  Medications:  Current Outpatient Medications:    acyclovir (ZOVIRAX) 400 MG tablet, Take 400 mg by mouth 2 (two) times daily., Disp: , Rfl:    glipiZIDE (GLUCOTROL) 5 MG tablet, TAKE 1 TABLET (5 MG TOTAL) BY MOUTH TWICE A DAY BEFORE MEALS, Disp: 180 tablet, Rfl: 1   lactulose (CHRONULAC) 10 GM/15ML solution, Take 15 mLs (10 g total) by mouth 3 (three) times daily., Disp: 450 mL, Rfl: 0   metFORMIN (GLUCOPHAGE) 500 MG tablet, Take 500 mg by mouth 2 (two) times daily with a meal., Disp: , Rfl:    tamsulosin (FLOMAX) 0.4 MG CAPS capsule, Take 0.4 mg by mouth daily., Disp: , Rfl:   Allergies: No Known Allergies  Past Medical History, Surgical history, Social history, and Family History were reviewed and updated.  Review of Systems: Review of Systems   Constitutional: Negative.   HENT:  Negative.    Eyes: Negative.   Respiratory: Negative.    Cardiovascular: Negative.   Gastrointestinal: Negative.   Endocrine: Negative.   Genitourinary:  Positive for difficulty urinating. Negative for bladder incontinence.   Musculoskeletal:  Positive for neck pain.  Skin: Negative.   Neurological: Negative.   Hematological: Negative.   Psychiatric/Behavioral: Negative.      Physical Exam:  height is 5\' 10"  (1.778 m) and weight is 176 lb (79.8 kg). His oral temperature is 98 F (36.7 C). His blood pressure is 129/75 and his pulse is 75. His respiration is 17 and oxygen saturation is 100%.   Wt Readings from Last 3 Encounters:  02/09/23 176 lb (79.8 kg)  12/21/22 197 lb (89.4 kg)  11/27/22 200 lb (90.7 kg)    Physical Exam Vitals reviewed.  HENT:     Head: Normocephalic and atraumatic.  Eyes:     Pupils: Pupils are equal, round, and reactive to light.  Neck:     Comments: His neck exam does show the healing cervical scar on the back of the neck.  This is a little bit firm.  He has no obvious adenopathy in the neck.  Cardiovascular:     Rate and Rhythm: Normal rate and regular rhythm.     Heart sounds: Normal  heart sounds.  Pulmonary:     Effort: Pulmonary effort is normal.     Breath sounds: Normal breath sounds.  Abdominal:     General: Bowel sounds are normal.     Palpations: Abdomen is soft.  Musculoskeletal:        General: No tenderness or deformity. Normal range of motion.     Cervical back: Normal range of motion.  Lymphadenopathy:     Cervical: No cervical adenopathy.  Skin:    General: Skin is warm and dry.     Findings: No erythema or rash.  Neurological:     Mental Status: He is alert and oriented to person, place, and time.  Psychiatric:        Behavior: Behavior normal.        Thought Content: Thought content normal.        Judgment: Judgment normal.      Lab Results  Component Value Date   WBC 6.1  02/09/2023   HGB 12.2 (L) 02/09/2023   HCT 35.8 (L) 02/09/2023   MCV 92.0 02/09/2023   PLT 181 02/09/2023     Chemistry      Component Value Date/Time   NA 140 02/09/2023 0920   K 4.6 02/09/2023 0920   CL 104 02/09/2023 0920   CO2 23 02/09/2023 0920   BUN 10 02/09/2023 0920   CREATININE 1.12 02/09/2023 0920      Component Value Date/Time   CALCIUM 9.5 02/09/2023 0920   ALKPHOS 41 02/09/2023 0920   AST 11 (L) 02/09/2023 0920   ALT 6 02/09/2023 0920   BILITOT 0.8 02/09/2023 0920      Impression and Plan: [name] is a very nice 67 year old white male.  He has IgA kappa myeloma.  Most of his myeloma is Kappa light chain.  Again, his light chains have come down incredibly well with chemotherapy.  We then got him to transplant.  At this point, we may have to think about a maintenance protocol.  I would think Faspro would be at least part of this.  He will need to have Zometa.  And we will probably do this when we see him back.  I will plan to see him back in about 6 weeks.  At some point, we may need to have another PET scan done.  This is done out of [facility].     Volanda Napoleon, MD 3/27/202410:58 AM

## 2023-02-23 ENCOUNTER — Other Ambulatory Visit: Payer: Self-pay | Admitting: *Deleted

## 2023-02-23 DIAGNOSIS — C9000 Multiple myeloma not having achieved remission: Secondary | ICD-10-CM

## 2023-02-23 MED ORDER — LACTULOSE 10 GM/15ML PO SOLN
10.0000 g | Freq: Three times a day (TID) | ORAL | 0 refills | Status: DC
Start: 2023-02-23 — End: 2023-02-25

## 2023-02-25 ENCOUNTER — Other Ambulatory Visit: Payer: Self-pay | Admitting: Hematology & Oncology

## 2023-02-25 DIAGNOSIS — C9000 Multiple myeloma not having achieved remission: Secondary | ICD-10-CM

## 2023-03-10 ENCOUNTER — Other Ambulatory Visit: Payer: Self-pay

## 2023-03-11 ENCOUNTER — Other Ambulatory Visit: Payer: Self-pay

## 2023-03-11 ENCOUNTER — Encounter: Payer: Self-pay | Admitting: *Deleted

## 2023-03-17 ENCOUNTER — Encounter: Payer: Self-pay | Admitting: *Deleted

## 2023-04-18 ENCOUNTER — Telehealth: Payer: Self-pay

## 2023-04-18 ENCOUNTER — Other Ambulatory Visit: Payer: Self-pay | Admitting: Hematology & Oncology

## 2023-04-18 DIAGNOSIS — C9000 Multiple myeloma not having achieved remission: Secondary | ICD-10-CM

## 2023-04-18 NOTE — Telephone Encounter (Signed)
Received phone call from patient inquiring about his next appointment. Pt stated he is not on any maintenance therapy and his last appointment was in March 2024 and he saw Dr. Jacqulyn Bath in April 2024 and has a follow up in July 2024. Pt aware that the scheduling department should be calling to set up an appointment for later this week or next week. Pt appreciative of call back and had no further questions.

## 2023-04-19 ENCOUNTER — Other Ambulatory Visit: Payer: Self-pay

## 2023-04-22 ENCOUNTER — Inpatient Hospital Stay: Payer: Medicare Other | Attending: Hematology & Oncology

## 2023-04-22 ENCOUNTER — Encounter: Payer: Self-pay | Admitting: Hematology & Oncology

## 2023-04-22 ENCOUNTER — Other Ambulatory Visit: Payer: Self-pay | Admitting: Pharmacist

## 2023-04-22 ENCOUNTER — Inpatient Hospital Stay (HOSPITAL_BASED_OUTPATIENT_CLINIC_OR_DEPARTMENT_OTHER): Payer: Medicare Other | Admitting: Hematology & Oncology

## 2023-04-22 VITALS — BP 145/71 | HR 64 | Temp 98.4°F | Resp 20 | Ht 70.0 in | Wt 178.0 lb

## 2023-04-22 DIAGNOSIS — Z5112 Encounter for antineoplastic immunotherapy: Secondary | ICD-10-CM | POA: Insufficient documentation

## 2023-04-22 DIAGNOSIS — C9000 Multiple myeloma not having achieved remission: Secondary | ICD-10-CM | POA: Insufficient documentation

## 2023-04-22 LAB — CMP (CANCER CENTER ONLY)
ALT: 8 U/L (ref 0–44)
AST: 12 U/L — ABNORMAL LOW (ref 15–41)
Albumin: 4.5 g/dL (ref 3.5–5.0)
Alkaline Phosphatase: 43 U/L (ref 38–126)
Anion gap: 10 (ref 5–15)
BUN: 14 mg/dL (ref 8–23)
CO2: 25 mmol/L (ref 22–32)
Calcium: 9.6 mg/dL (ref 8.9–10.3)
Chloride: 106 mmol/L (ref 98–111)
Creatinine: 1.31 mg/dL — ABNORMAL HIGH (ref 0.61–1.24)
GFR, Estimated: >60 mL/min (ref >60)
Glucose, Bld: 116 mg/dL — ABNORMAL HIGH (ref 70–99)
Potassium: 4.5 mmol/L (ref 3.5–5.1)
Sodium: 141 mmol/L (ref 135–145)
Total Bilirubin: 0.6 mg/dL (ref 0.3–1.2)
Total Protein: 6.5 g/dL (ref 6.5–8.1)

## 2023-04-22 LAB — CBC WITH DIFFERENTIAL (CANCER CENTER ONLY)
Abs Immature Granulocytes: 0.02 10*3/uL (ref 0.00–0.07)
Basophils Absolute: 0 10*3/uL (ref 0.0–0.1)
Basophils Relative: 1 %
Eosinophils Absolute: 0.2 10*3/uL (ref 0.0–0.5)
Eosinophils Relative: 3 %
HCT: 37.7 % — ABNORMAL LOW (ref 39.0–52.0)
Hemoglobin: 12.6 g/dL — ABNORMAL LOW (ref 13.0–17.0)
Immature Granulocytes: 0 %
Lymphocytes Relative: 18 %
Lymphs Abs: 1.1 10*3/uL (ref 0.7–4.0)
MCH: 31.8 pg (ref 26.0–34.0)
MCHC: 33.4 g/dL (ref 30.0–36.0)
MCV: 95.2 fL (ref 80.0–100.0)
Monocytes Absolute: 0.7 10*3/uL (ref 0.1–1.0)
Monocytes Relative: 11 %
Neutro Abs: 4.2 10*3/uL (ref 1.7–7.7)
Neutrophils Relative %: 67 %
Platelet Count: 185 10*3/uL (ref 150–400)
RBC: 3.96 MIL/uL — ABNORMAL LOW (ref 4.22–5.81)
RDW: 12.3 % (ref 11.5–15.5)
WBC Count: 6.2 10*3/uL (ref 4.0–10.5)
nRBC: 0 % (ref 0.0–0.2)

## 2023-04-22 LAB — LACTATE DEHYDROGENASE: LDH: 104 U/L (ref 98–192)

## 2023-04-22 NOTE — Progress Notes (Signed)
Hematology and Oncology Follow Up Visit  Barry Gray [id number] Aug 15, 1956 67 y.o. 04/22/2023   Principle Diagnosis:  IgA Kappa myeloma -- normal cytogenetics/FISH (-)  Current Therapy:   S/p cervical spine stabilization -- 04/28/2022 Faspro/Velcade/Decadron -- s/p cycle #6-- start on 05/20/2022 Zometa 4 mg IV q 3 months -- next dose on 03/2023 Status post autologous stem cell transplant -- [facility] on 12/03/2022 Faspro/Velcade -- maintenance -  start on 04/28/2023     Interim History:  Mr. Shea is back for follow-up.  We last saw him back in March.  He has seen Dr. Jacqulyn Bath at [facility].  We will go ahead and start him on maintenance therapy.  He will be on Faspro/Velcade.  He will have both for 2 cycles and then GIST maintenance Faspro.  He has had no problems with pain.  He has had no problems with nausea or vomiting.  He has had no cough or shortness of breath.  There is been no issues with bowels or bladder.  He has had no leg swelling.  He has had no bleeding.  It has been a while since we last saw him.  When we last saw him, he had a monoclonal spike of 0.1 g/dL.  His IgA level was 106 mg/dL.  The Kappa light chain was 3.4 mg/dL.  He has had no issues with fever.  He had no problems with COVID.  Overall, I would say that his performance status is probably ECOG 1.    Medications:  Current Outpatient Medications:    acyclovir (ZOVIRAX) 400 MG tablet, Take 400 mg by mouth 2 (two) times daily., Disp: , Rfl:    lactulose (CHRONULAC) 10 GM/15ML solution, TAKE 15 MLS (10 G TOTAL) BY MOUTH 3 (THREE) TIMES DAILY. (Patient taking differently: Take 10 g by mouth daily as needed.), Disp: 946 mL, Rfl: 2   metFORMIN (GLUCOPHAGE) 500 MG tablet, Take 500 mg by mouth 2 (two) times daily with a meal., Disp: , Rfl:    tamsulosin (FLOMAX) 0.4 MG CAPS capsule, Take 0.4 mg by mouth daily., Disp: , Rfl:    glipiZIDE (GLUCOTROL) 5 MG tablet, Take 5 mg by mouth 2 (two) times daily before a meal. (Patient  not taking: Reported on 04/22/2023), Disp: , Rfl:    levothyroxine (SYNTHROID) 50 MCG tablet, Take 50 mcg by mouth daily before breakfast. (Patient not taking: Reported on 04/22/2023), Disp: , Rfl:   Allergies:  Allergies  Allergen Reactions   Revlimid [Lenalidomide] Rash    Past Medical History, Surgical history, Social history, and Family History were reviewed and updated.  Review of Systems: Review of Systems  Constitutional: Negative.   HENT:  Negative.    Eyes: Negative.   Respiratory: Negative.    Cardiovascular: Negative.   Gastrointestinal: Negative.   Endocrine: Negative.   Genitourinary:  Positive for difficulty urinating. Negative for bladder incontinence.   Musculoskeletal:  Positive for neck pain.  Skin: Negative.   Neurological: Negative.   Hematological: Negative.   Psychiatric/Behavioral: Negative.      Physical Exam:  height is 5\' 10"  (1.778 m) and weight is 178 lb (80.7 kg). His oral temperature is 98.4 F (36.9 C). His blood pressure is 125/88 (pended) and his pulse is 64. His respiration is 20 and oxygen saturation is 100%.   Wt Readings from Last 3 Encounters:  04/22/23 178 lb (80.7 kg)  02/09/23 176 lb (79.8 kg)  12/21/22 197 lb (89.4 kg)    Physical Exam Vitals reviewed.  HENT:  Head: Normocephalic and atraumatic.  Eyes:     Pupils: Pupils are equal, round, and reactive to light.  Neck:     Comments: His neck exam does show the healing cervical scar on the back of the neck.  This is a little bit firm.  He has no obvious adenopathy in the neck.  Cardiovascular:     Rate and Rhythm: Normal rate and regular rhythm.     Heart sounds: Normal heart sounds.  Pulmonary:     Effort: Pulmonary effort is normal.     Breath sounds: Normal breath sounds.  Abdominal:     General: Bowel sounds are normal.     Palpations: Abdomen is soft.  Musculoskeletal:        General: No tenderness or deformity. Normal range of motion.     Cervical back: Normal range  of motion.  Lymphadenopathy:     Cervical: No cervical adenopathy.  Skin:    General: Skin is warm and dry.     Findings: No erythema or rash.  Neurological:     Mental Status: He is alert and oriented to person, place, and time.  Psychiatric:        Behavior: Behavior normal.        Thought Content: Thought content normal.        Judgment: Judgment normal.     Lab Results  Component Value Date   WBC 6.2 04/22/2023   HGB 12.6 (L) 04/22/2023   HCT 37.7 (L) 04/22/2023   MCV 95.2 04/22/2023   PLT 185 04/22/2023     Chemistry      Component Value Date/Time   NA 141 04/22/2023 0908   K 4.5 04/22/2023 0908   CL 106 04/22/2023 0908   CO2 25 04/22/2023 0908   BUN 14 04/22/2023 0908   CREATININE 1.31 (H) 04/22/2023 0908      Component Value Date/Time   CALCIUM 9.6 04/22/2023 0908   ALKPHOS 43 04/22/2023 0908   AST 12 (L) 04/22/2023 0908   ALT 8 04/22/2023 0908   BILITOT 0.6 04/22/2023 0908      Impression and Plan: Mr. Currier is a very nice 67 year old white male.  He has IgA kappa myeloma.  Most of his myeloma is Kappa light chain.  Again, his light chains have come down incredibly well with chemotherapy.  We then got him to transplant.  We will get him started on maintenance therapy.  We will do 2 cycles of Faspro/Velcade and then go just  with Faspro.  We also have to make sure he gets his Zometa.   Josph Macho, MD 6/7/202410:14 AM

## 2023-04-24 LAB — IGG, IGA, IGM
IgA: 25 mg/dL — ABNORMAL LOW (ref 61–437)
IgG (Immunoglobin G), Serum: 206 mg/dL — ABNORMAL LOW (ref 603–1613)
IgM (Immunoglobulin M), Srm: 11 mg/dL — ABNORMAL LOW (ref 20–172)

## 2023-04-25 LAB — KAPPA/LAMBDA LIGHT CHAINS
Kappa free light chain: 6.5 mg/L (ref 3.3–19.4)
Kappa, lambda light chain ratio: 2.03 — ABNORMAL HIGH (ref 0.26–1.65)
Lambda free light chains: 3.2 mg/L — ABNORMAL LOW (ref 5.7–26.3)

## 2023-04-26 ENCOUNTER — Other Ambulatory Visit: Payer: Self-pay

## 2023-04-27 LAB — PROTEIN ELECTROPHORESIS, SERUM, WITH REFLEX
A/G Ratio: 1.8 — ABNORMAL HIGH (ref 0.7–1.7)
Albumin ELP: 3.9 g/dL (ref 2.9–4.4)
Alpha-1-Globulin: 0.2 g/dL (ref 0.0–0.4)
Alpha-2-Globulin: 1 g/dL (ref 0.4–1.0)
Beta Globulin: 0.8 g/dL (ref 0.7–1.3)
Gamma Globulin: 0.1 g/dL — ABNORMAL LOW (ref 0.4–1.8)
Globulin, Total: 2.2 g/dL (ref 2.2–3.9)
Total Protein ELP: 6.1 g/dL (ref 6.0–8.5)

## 2023-04-29 ENCOUNTER — Inpatient Hospital Stay: Payer: Medicare Other

## 2023-04-29 ENCOUNTER — Other Ambulatory Visit: Payer: Self-pay | Admitting: Hematology & Oncology

## 2023-04-29 VITALS — BP 127/73 | HR 60 | Temp 98.2°F | Resp 17

## 2023-04-29 DIAGNOSIS — Z5112 Encounter for antineoplastic immunotherapy: Secondary | ICD-10-CM | POA: Diagnosis not present

## 2023-04-29 DIAGNOSIS — C9000 Multiple myeloma not having achieved remission: Secondary | ICD-10-CM

## 2023-04-29 LAB — CBC WITH DIFFERENTIAL (CANCER CENTER ONLY)
Abs Immature Granulocytes: 0.01 10*3/uL (ref 0.00–0.07)
Basophils Absolute: 0 10*3/uL (ref 0.0–0.1)
Basophils Relative: 1 %
Eosinophils Absolute: 0.2 10*3/uL (ref 0.0–0.5)
Eosinophils Relative: 3 %
HCT: 37.3 % — ABNORMAL LOW (ref 39.0–52.0)
Hemoglobin: 12.6 g/dL — ABNORMAL LOW (ref 13.0–17.0)
Immature Granulocytes: 0 %
Lymphocytes Relative: 13 %
Lymphs Abs: 0.9 10*3/uL (ref 0.7–4.0)
MCH: 32.1 pg (ref 26.0–34.0)
MCHC: 33.8 g/dL (ref 30.0–36.0)
MCV: 94.9 fL (ref 80.0–100.0)
Monocytes Absolute: 0.7 10*3/uL (ref 0.1–1.0)
Monocytes Relative: 11 %
Neutro Abs: 4.9 10*3/uL (ref 1.7–7.7)
Neutrophils Relative %: 72 %
Platelet Count: 181 10*3/uL (ref 150–400)
RBC: 3.93 MIL/uL — ABNORMAL LOW (ref 4.22–5.81)
RDW: 12.6 % (ref 11.5–15.5)
WBC Count: 6.8 10*3/uL (ref 4.0–10.5)
nRBC: 0 % (ref 0.0–0.2)

## 2023-04-29 LAB — CMP (CANCER CENTER ONLY)
ALT: 9 U/L (ref 0–44)
AST: 13 U/L — ABNORMAL LOW (ref 15–41)
Albumin: 4.6 g/dL (ref 3.5–5.0)
Alkaline Phosphatase: 41 U/L (ref 38–126)
Anion gap: 9 (ref 5–15)
BUN: 18 mg/dL (ref 8–23)
CO2: 25 mmol/L (ref 22–32)
Calcium: 10.2 mg/dL (ref 8.9–10.3)
Chloride: 106 mmol/L (ref 98–111)
Creatinine: 1.33 mg/dL — ABNORMAL HIGH (ref 0.61–1.24)
GFR, Estimated: 59 mL/min — ABNORMAL LOW (ref >60)
Glucose, Bld: 113 mg/dL — ABNORMAL HIGH (ref 70–99)
Potassium: 4.4 mmol/L (ref 3.5–5.1)
Sodium: 140 mmol/L (ref 135–145)
Total Bilirubin: 0.6 mg/dL (ref 0.3–1.2)
Total Protein: 6.6 g/dL (ref 6.5–8.1)

## 2023-04-29 LAB — PHOSPHORUS: Phosphorus: 4.5 mg/dL (ref 2.5–4.6)

## 2023-04-29 LAB — MAGNESIUM: Magnesium: 2 mg/dL (ref 1.7–2.4)

## 2023-04-29 MED ORDER — DEXAMETHASONE 4 MG PO TABS
20.0000 mg | ORAL_TABLET | Freq: Once | ORAL | Status: AC
  Administered 2023-04-29: 20 mg via ORAL
  Filled 2023-04-29: qty 5

## 2023-04-29 MED ORDER — DIPHENHYDRAMINE HCL 25 MG PO CAPS
50.0000 mg | ORAL_CAPSULE | Freq: Once | ORAL | Status: AC
  Administered 2023-04-29: 50 mg via ORAL
  Filled 2023-04-29: qty 2

## 2023-04-29 MED ORDER — DARATUMUMAB-HYALURONIDASE-FIHJ 1800-30000 MG-UT/15ML ~~LOC~~ SOLN
1800.0000 mg | Freq: Once | SUBCUTANEOUS | Status: AC
  Administered 2023-04-29: 1800 mg via SUBCUTANEOUS
  Filled 2023-04-29: qty 15

## 2023-04-29 MED ORDER — BORTEZOMIB CHEMO SQ INJECTION 3.5 MG (2.5MG/ML)
1.3000 mg/m2 | Freq: Once | INTRAMUSCULAR | Status: AC
  Administered 2023-04-29: 2.75 mg via SUBCUTANEOUS
  Filled 2023-04-29: qty 1.1

## 2023-04-29 MED ORDER — ACETAMINOPHEN 325 MG PO TABS
650.0000 mg | ORAL_TABLET | Freq: Once | ORAL | Status: AC
  Administered 2023-04-29: 650 mg via ORAL
  Filled 2023-04-29: qty 2

## 2023-04-29 NOTE — Patient Instructions (Signed)
[facility]  Discharge Instructions: Thank you for choosing [facility] to provide your oncology and hematology care.   If you have a lab appointment with the Cancer Center, please go directly to the Cancer Center and check in at the registration area.  Wear comfortable clothing and clothing appropriate for easy access to any Portacath or PICC line.   We strive to give you quality time with your provider. You may need to reschedule your appointment if you arrive late (15 or more minutes).  Arriving late affects you and other patients whose appointments are after yours.  Also, if you miss three or more appointments without notifying the office, you may be dismissed from the clinic at the provider's discretion.      For prescription refill requests, have your pharmacy contact our office and allow 72 hours for refills to be completed.    Today you received the following chemotherapy and/or immunotherapy agents Velcade and Faspro   To help prevent nausea and vomiting after your treatment, we encourage you to take your nausea medication as directed.  BELOW ARE SYMPTOMS THAT SHOULD BE REPORTED IMMEDIATELY: *FEVER GREATER THAN 100.4 F (38 C) OR HIGHER *CHILLS OR SWEATING *NAUSEA AND VOMITING THAT IS NOT CONTROLLED WITH YOUR NAUSEA MEDICATION *UNUSUAL SHORTNESS OF BREATH *UNUSUAL BRUISING OR BLEEDING *URINARY PROBLEMS (pain or burning when urinating, or frequent urination) *BOWEL PROBLEMS (unusual diarrhea, constipation, pain near the anus) TENDERNESS IN MOUTH AND THROAT WITH OR WITHOUT PRESENCE OF ULCERS (sore throat, sores in mouth, or a toothache) UNUSUAL RASH, SWELLING OR PAIN  UNUSUAL VAGINAL DISCHARGE OR ITCHING   Items with * indicate a potential emergency and should be followed up as soon as possible or go to the Emergency Department if any problems should occur.  Please show the CHEMOTHERAPY ALERT CARD or IMMUNOTHERAPY ALERT CARD at  check-in to the Emergency Department and triage nurse. Should you have questions after your visit or need to cancel or reschedule your appointment, please contact [facility]  [phone] and follow the prompts.  Office hours are 8:00 a.m. to 4:30 p.m. Monday - Friday. Please note that voicemails left after 4:00 p.m. may not be returned until the following business day.  We are closed weekends and major holidays. You have access to a nurse at all times for urgent questions. Please call the main number to the clinic [phone] and follow the prompts.  For any non-urgent questions, you may also contact your provider using MyChart. We now offer e-Visits for anyone 13 and older to request care online for non-urgent symptoms. For details visit [email or web address].   Also download the MyChart app! Go to the app store, search "MyChart", open the app, select [place], and log in with your MyChart username and password.  Masks are optional in the cancer centers. If you would like for your care team to wear a mask while they are taking care of you, please let them know. You may have one support person who is at least 67 years old accompany you for your appointments.

## 2023-05-03 LAB — UPEP/UIFE/LIGHT CHAINS/TP, 24-HR UR
% BETA, Urine: 10.1 %
ALPHA 1 URINE: 2.3 %
Albumin, U: 80.4 %
Alpha 2, Urine: 5.3 %
Free Kappa Lt Chains,Ur: 23.29 mg/L (ref 1.17–86.46)
Free Kappa/Lambda Ratio: 8.66 (ref 1.83–14.26)
Free Lambda Lt Chains,Ur: 2.69 mg/L (ref 0.27–15.21)
GAMMA GLOBULIN URINE: 1.9 %
Total Protein, Urine-Ur/day: 559 mg/24 hr — ABNORMAL HIGH (ref 30–150)
Total Protein, Urine: 22.8 mg/dL
Total Volume: 2450

## 2023-05-06 ENCOUNTER — Inpatient Hospital Stay: Payer: Medicare Other

## 2023-05-06 VITALS — BP 117/81 | HR 69 | Temp 98.1°F | Resp 18

## 2023-05-06 DIAGNOSIS — Z5112 Encounter for antineoplastic immunotherapy: Secondary | ICD-10-CM | POA: Diagnosis not present

## 2023-05-06 DIAGNOSIS — C9000 Multiple myeloma not having achieved remission: Secondary | ICD-10-CM

## 2023-05-06 LAB — CMP (CANCER CENTER ONLY)
ALT: 10 U/L (ref 0–44)
AST: 11 U/L — ABNORMAL LOW (ref 15–41)
Albumin: 4.6 g/dL (ref 3.5–5.0)
Alkaline Phosphatase: 46 U/L (ref 38–126)
Anion gap: 9 (ref 5–15)
BUN: 20 mg/dL (ref 8–23)
CO2: 26 mmol/L (ref 22–32)
Calcium: 9.8 mg/dL (ref 8.9–10.3)
Chloride: 105 mmol/L (ref 98–111)
Creatinine: 1.34 mg/dL — ABNORMAL HIGH (ref 0.61–1.24)
GFR, Estimated: 58 mL/min — ABNORMAL LOW (ref >60)
Glucose, Bld: 122 mg/dL — ABNORMAL HIGH (ref 70–99)
Potassium: 4.5 mmol/L (ref 3.5–5.1)
Sodium: 140 mmol/L (ref 135–145)
Total Bilirubin: 0.8 mg/dL (ref 0.3–1.2)
Total Protein: 6.5 g/dL (ref 6.5–8.1)

## 2023-05-06 LAB — CBC WITH DIFFERENTIAL (CANCER CENTER ONLY)
Abs Immature Granulocytes: 0.01 10*3/uL (ref 0.00–0.07)
Basophils Absolute: 0 10*3/uL (ref 0.0–0.1)
Basophils Relative: 1 %
Eosinophils Absolute: 0.2 10*3/uL (ref 0.0–0.5)
Eosinophils Relative: 3 %
HCT: 38.5 % — ABNORMAL LOW (ref 39.0–52.0)
Hemoglobin: 13.1 g/dL (ref 13.0–17.0)
Immature Granulocytes: 0 %
Lymphocytes Relative: 13 %
Lymphs Abs: 0.8 10*3/uL (ref 0.7–4.0)
MCH: 31.9 pg (ref 26.0–34.0)
MCHC: 34 g/dL (ref 30.0–36.0)
MCV: 93.7 fL (ref 80.0–100.0)
Monocytes Absolute: 0.6 10*3/uL (ref 0.1–1.0)
Monocytes Relative: 10 %
Neutro Abs: 4.4 10*3/uL (ref 1.7–7.7)
Neutrophils Relative %: 73 %
Platelet Count: 185 10*3/uL (ref 150–400)
RBC: 4.11 MIL/uL — ABNORMAL LOW (ref 4.22–5.81)
RDW: 12.8 % (ref 11.5–15.5)
WBC Count: 6.1 10*3/uL (ref 4.0–10.5)
nRBC: 0 % (ref 0.0–0.2)

## 2023-05-06 LAB — MAGNESIUM: Magnesium: 2 mg/dL (ref 1.7–2.4)

## 2023-05-06 LAB — PHOSPHORUS: Phosphorus: 3.8 mg/dL (ref 2.5–4.6)

## 2023-05-06 MED ORDER — BORTEZOMIB CHEMO SQ INJECTION 3.5 MG (2.5MG/ML)
1.3000 mg/m2 | Freq: Once | INTRAMUSCULAR | Status: AC
  Administered 2023-05-06: 2.75 mg via SUBCUTANEOUS
  Filled 2023-05-06: qty 1.1

## 2023-05-06 MED ORDER — DEXAMETHASONE 4 MG PO TABS: 20.0000 mg | ORAL_TABLET | Freq: Once | ORAL | Status: DC

## 2023-05-06 NOTE — Patient Instructions (Signed)
[facility]  Discharge Instructions: Thank you for choosing [facility] to provide your oncology and hematology care.   If you have a lab appointment with the Cancer Center, please go directly to the Cancer Center and check in at the registration area.  Wear comfortable clothing and clothing appropriate for easy access to any Portacath or PICC line.   We strive to give you quality time with your provider. You may need to reschedule your appointment if you arrive late (15 or more minutes).  Arriving late affects you and other patients whose appointments are after yours.  Also, if you miss three or more appointments without notifying the office, you may be dismissed from the clinic at the provider's discretion.      For prescription refill requests, have your pharmacy contact our office and allow 72 hours for refills to be completed.    Today you received the following chemotherapy and/or immunotherapy agents Velcade.      To help prevent nausea and vomiting after your treatment, we encourage you to take your nausea medication as directed.  BELOW ARE SYMPTOMS THAT SHOULD BE REPORTED IMMEDIATELY: *FEVER GREATER THAN 100.4 F (38 C) OR HIGHER *CHILLS OR SWEATING *NAUSEA AND VOMITING THAT IS NOT CONTROLLED WITH YOUR NAUSEA MEDICATION *UNUSUAL SHORTNESS OF BREATH *UNUSUAL BRUISING OR BLEEDING *URINARY PROBLEMS (pain or burning when urinating, or frequent urination) *BOWEL PROBLEMS (unusual diarrhea, constipation, pain near the anus) TENDERNESS IN MOUTH AND THROAT WITH OR WITHOUT PRESENCE OF ULCERS (sore throat, sores in mouth, or a toothache) UNUSUAL RASH, SWELLING OR PAIN  UNUSUAL VAGINAL DISCHARGE OR ITCHING   Items with * indicate a potential emergency and should be followed up as soon as possible or go to the Emergency Department if any problems should occur.  Please show the CHEMOTHERAPY ALERT CARD or IMMUNOTHERAPY ALERT CARD at check-in  to the Emergency Department and triage nurse. Should you have questions after your visit or need to cancel or reschedule your appointment, please contact [facility]  [phone] and follow the prompts.  Office hours are 8:00 a.m. to 4:30 p.m. Monday - Friday. Please note that voicemails left after 4:00 p.m. may not be returned until the following business day.  We are closed weekends and major holidays. You have access to a nurse at all times for urgent questions. Please call the main number to the clinic [phone] and follow the prompts.  For any non-urgent questions, you may also contact your provider using MyChart. We now offer e-Visits for anyone 18 and older to request care online for non-urgent symptoms. For details visit [email or web address].   Also download the MyChart app! Go to the app store, search "MyChart", open the app, select [place], and log in with your MyChart username and password.   

## 2023-05-13 ENCOUNTER — Inpatient Hospital Stay: Payer: Medicare Other

## 2023-05-13 VITALS — BP 138/74 | HR 91 | Temp 97.9°F | Resp 18

## 2023-05-13 DIAGNOSIS — Z5112 Encounter for antineoplastic immunotherapy: Secondary | ICD-10-CM | POA: Diagnosis not present

## 2023-05-13 DIAGNOSIS — C9000 Multiple myeloma not having achieved remission: Secondary | ICD-10-CM

## 2023-05-13 LAB — CBC WITH DIFFERENTIAL (CANCER CENTER ONLY)
Abs Immature Granulocytes: 0.02 10*3/uL (ref 0.00–0.07)
Basophils Absolute: 0 10*3/uL (ref 0.0–0.1)
Basophils Relative: 0 %
Eosinophils Absolute: 0.1 10*3/uL (ref 0.0–0.5)
Eosinophils Relative: 2 %
HCT: 39 % (ref 39.0–52.0)
Hemoglobin: 13.1 g/dL (ref 13.0–17.0)
Immature Granulocytes: 0 %
Lymphocytes Relative: 14 %
Lymphs Abs: 1 10*3/uL (ref 0.7–4.0)
MCH: 32 pg (ref 26.0–34.0)
MCHC: 33.6 g/dL (ref 30.0–36.0)
MCV: 95.1 fL (ref 80.0–100.0)
Monocytes Absolute: 0.7 10*3/uL (ref 0.1–1.0)
Monocytes Relative: 10 %
Neutro Abs: 5.4 10*3/uL (ref 1.7–7.7)
Neutrophils Relative %: 74 %
Platelet Count: 178 10*3/uL (ref 150–400)
RBC: 4.1 MIL/uL — ABNORMAL LOW (ref 4.22–5.81)
RDW: 12.8 % (ref 11.5–15.5)
WBC Count: 7.3 10*3/uL (ref 4.0–10.5)
nRBC: 0 % (ref 0.0–0.2)

## 2023-05-13 LAB — CMP (CANCER CENTER ONLY)
ALT: 9 U/L (ref 0–44)
AST: 11 U/L — ABNORMAL LOW (ref 15–41)
Albumin: 4.7 g/dL (ref 3.5–5.0)
Alkaline Phosphatase: 48 U/L (ref 38–126)
Anion gap: 11 (ref 5–15)
BUN: 17 mg/dL (ref 8–23)
CO2: 26 mmol/L (ref 22–32)
Calcium: 9.8 mg/dL (ref 8.9–10.3)
Chloride: 101 mmol/L (ref 98–111)
Creatinine: 1.45 mg/dL — ABNORMAL HIGH (ref 0.61–1.24)
GFR, Estimated: 53 mL/min — ABNORMAL LOW (ref >60)
Glucose, Bld: 120 mg/dL — ABNORMAL HIGH (ref 70–99)
Potassium: 4.9 mmol/L (ref 3.5–5.1)
Sodium: 138 mmol/L (ref 135–145)
Total Bilirubin: 0.5 mg/dL (ref 0.3–1.2)
Total Protein: 6.9 g/dL (ref 6.5–8.1)

## 2023-05-13 MED ORDER — DEXAMETHASONE 4 MG PO TABS
20.0000 mg | ORAL_TABLET | Freq: Once | ORAL | Status: DC
Start: 2023-05-13 — End: 2023-05-13

## 2023-05-13 MED ORDER — BORTEZOMIB CHEMO SQ INJECTION 3.5 MG (2.5MG/ML)
1.3000 mg/m2 | Freq: Once | INTRAMUSCULAR | Status: AC
  Administered 2023-05-13: 2.75 mg via SUBCUTANEOUS
  Filled 2023-05-13: qty 1.1

## 2023-05-13 NOTE — Patient Instructions (Signed)
[facility]  Discharge Instructions: Thank you for choosing [facility] to provide your oncology and hematology care.   If you have a lab appointment with the Cancer Center, please go directly to the Cancer Center and check in at the registration area.  Wear comfortable clothing and clothing appropriate for easy access to any Portacath or PICC line.   We strive to give you quality time with your provider. You may need to reschedule your appointment if you arrive late (15 or more minutes).  Arriving late affects you and other patients whose appointments are after yours.  Also, if you miss three or more appointments without notifying the office, you may be dismissed from the clinic at the provider's discretion.      For prescription refill requests, have your pharmacy contact our office and allow 72 hours for refills to be completed.    Today you received the following chemotherapy and/or immunotherapy agents Velcade.      To help prevent nausea and vomiting after your treatment, we encourage you to take your nausea medication as directed.  BELOW ARE SYMPTOMS THAT SHOULD BE REPORTED IMMEDIATELY: *FEVER GREATER THAN 100.4 F (38 C) OR HIGHER *CHILLS OR SWEATING *NAUSEA AND VOMITING THAT IS NOT CONTROLLED WITH YOUR NAUSEA MEDICATION *UNUSUAL SHORTNESS OF BREATH *UNUSUAL BRUISING OR BLEEDING *URINARY PROBLEMS (pain or burning when urinating, or frequent urination) *BOWEL PROBLEMS (unusual diarrhea, constipation, pain near the anus) TENDERNESS IN MOUTH AND THROAT WITH OR WITHOUT PRESENCE OF ULCERS (sore throat, sores in mouth, or a toothache) UNUSUAL RASH, SWELLING OR PAIN  UNUSUAL VAGINAL DISCHARGE OR ITCHING   Items with * indicate a potential emergency and should be followed up as soon as possible or go to the Emergency Department if any problems should occur.  Please show the CHEMOTHERAPY ALERT CARD or IMMUNOTHERAPY ALERT CARD at check-in  to the Emergency Department and triage nurse. Should you have questions after your visit or need to cancel or reschedule your appointment, please contact [facility]  [phone] and follow the prompts.  Office hours are 8:00 a.m. to 4:30 p.m. Monday - Friday. Please note that voicemails left after 4:00 p.m. may not be returned until the following business day.  We are closed weekends and major holidays. You have access to a nurse at all times for urgent questions. Please call the main number to the clinic [phone] and follow the prompts.  For any non-urgent questions, you may also contact your provider using MyChart. We now offer e-Visits for anyone 18 and older to request care online for non-urgent symptoms. For details visit [email or web address].   Also download the MyChart app! Go to the app store, search "MyChart", open the app, select [place], and log in with your MyChart username and password.   

## 2023-05-13 NOTE — Progress Notes (Signed)
Removing dexamethasone from careplan per Dr. Gustavo Lah instructions.

## 2023-05-20 ENCOUNTER — Inpatient Hospital Stay: Payer: Medicare Other

## 2023-05-20 ENCOUNTER — Encounter: Payer: Self-pay | Admitting: Hematology & Oncology

## 2023-05-20 ENCOUNTER — Other Ambulatory Visit: Payer: Self-pay

## 2023-05-20 ENCOUNTER — Inpatient Hospital Stay: Payer: Medicare Other | Attending: Hematology & Oncology | Admitting: Hematology & Oncology

## 2023-05-20 VITALS — BP 138/61 | HR 66 | Temp 97.8°F | Resp 18 | Ht 70.0 in | Wt 176.0 lb

## 2023-05-20 DIAGNOSIS — Z5112 Encounter for antineoplastic immunotherapy: Secondary | ICD-10-CM | POA: Insufficient documentation

## 2023-05-20 DIAGNOSIS — C9000 Multiple myeloma not having achieved remission: Secondary | ICD-10-CM

## 2023-05-20 LAB — CMP (CANCER CENTER ONLY)
ALT: 8 U/L (ref 0–44)
AST: 11 U/L — ABNORMAL LOW (ref 15–41)
Albumin: 4.5 g/dL (ref 3.5–5.0)
Alkaline Phosphatase: 43 U/L (ref 38–126)
Anion gap: 11 (ref 5–15)
BUN: 15 mg/dL (ref 8–23)
CO2: 25 mmol/L (ref 22–32)
Calcium: 9.8 mg/dL (ref 8.9–10.3)
Chloride: 107 mmol/L (ref 98–111)
Creatinine: 1.27 mg/dL — ABNORMAL HIGH (ref 0.61–1.24)
GFR, Estimated: >60 mL/min (ref >60)
Glucose, Bld: 60 mg/dL — ABNORMAL LOW (ref 70–99)
Potassium: 4 mmol/L (ref 3.5–5.1)
Sodium: 143 mmol/L (ref 135–145)
Total Bilirubin: 0.7 mg/dL (ref 0.3–1.2)
Total Protein: 6.7 g/dL (ref 6.5–8.1)

## 2023-05-20 LAB — CBC WITH DIFFERENTIAL (CANCER CENTER ONLY)
Abs Immature Granulocytes: 0.02 10*3/uL (ref 0.00–0.07)
Basophils Absolute: 0 10*3/uL (ref 0.0–0.1)
Basophils Relative: 0 %
Eosinophils Absolute: 0.2 10*3/uL (ref 0.0–0.5)
Eosinophils Relative: 3 %
HCT: 38.1 % — ABNORMAL LOW (ref 39.0–52.0)
Hemoglobin: 12.9 g/dL — ABNORMAL LOW (ref 13.0–17.0)
Immature Granulocytes: 0 %
Lymphocytes Relative: 18 %
Lymphs Abs: 0.9 10*3/uL (ref 0.7–4.0)
MCH: 32.1 pg (ref 26.0–34.0)
MCHC: 33.9 g/dL (ref 30.0–36.0)
MCV: 94.8 fL (ref 80.0–100.0)
Monocytes Absolute: 0.7 10*3/uL (ref 0.1–1.0)
Monocytes Relative: 14 %
Neutro Abs: 3.3 10*3/uL (ref 1.7–7.7)
Neutrophils Relative %: 65 %
Platelet Count: 166 10*3/uL (ref 150–400)
RBC: 4.02 MIL/uL — ABNORMAL LOW (ref 4.22–5.81)
RDW: 12.9 % (ref 11.5–15.5)
WBC Count: 5.1 10*3/uL (ref 4.0–10.5)
nRBC: 0 % (ref 0.0–0.2)

## 2023-05-20 LAB — LACTATE DEHYDROGENASE: LDH: 97 U/L — ABNORMAL LOW (ref 98–192)

## 2023-05-20 MED ORDER — SODIUM CHLORIDE 0.9 % IV SOLN: Freq: Once | INTRAVENOUS | Status: AC

## 2023-05-20 MED ORDER — ZOLEDRONIC ACID 4 MG/100ML IV SOLN
4.0000 mg | Freq: Once | INTRAVENOUS | Status: AC
  Administered 2023-05-20: 4 mg via INTRAVENOUS
  Filled 2023-05-20: qty 100

## 2023-05-20 MED ORDER — ACETAMINOPHEN 325 MG PO TABS
650.0000 mg | ORAL_TABLET | Freq: Once | ORAL | Status: AC
  Administered 2023-05-20: 650 mg via ORAL
  Filled 2023-05-20: qty 2

## 2023-05-20 MED ORDER — DARATUMUMAB-HYALURONIDASE-FIHJ 1800-30000 MG-UT/15ML ~~LOC~~ SOLN
1800.0000 mg | Freq: Once | SUBCUTANEOUS | Status: AC
  Administered 2023-05-20: 1800 mg via SUBCUTANEOUS
  Filled 2023-05-20: qty 15

## 2023-05-20 MED ORDER — BORTEZOMIB CHEMO SQ INJECTION 3.5 MG (2.5MG/ML)
1.3000 mg/m2 | Freq: Once | INTRAMUSCULAR | Status: AC
  Administered 2023-05-20: 2.75 mg via SUBCUTANEOUS
  Filled 2023-05-20: qty 1.1

## 2023-05-20 MED ORDER — DIPHENHYDRAMINE HCL 25 MG PO CAPS
50.0000 mg | ORAL_CAPSULE | Freq: Once | ORAL | Status: AC
  Administered 2023-05-20: 50 mg via ORAL
  Filled 2023-05-20: qty 2

## 2023-05-20 NOTE — Progress Notes (Signed)
Hematology and Oncology Follow Up Visit  Barry Gray [id number] 02/24/1956 67 y.o. 05/20/2023   Principle Diagnosis:  IgA Kappa myeloma -- normal cytogenetics/FISH (-)  Current Therapy:   S/p cervical spine stabilization -- 04/28/2022 Faspro/Velcade/Decadron -- s/p cycle #6-- start on 05/20/2022 Zometa 4 mg IV q 3 months -- next dose on 07/2023 Status post autologous stem cell transplant -- [facility] on 12/03/2022 Faspro/Velcade -- maintenance -  start on 04/28/2023     Interim History:  Barry Gray is back for follow-up.  So far, he is doing quite well.  He really has had no complaints.  He has had no issues with nausea or vomiting.  He has had no problems with bowels or bladder.  He and his family had a very nice July 4 holiday.  He has not been able to ride his bike all that much because of the hot weather.  When we last saw him, there was no monoclonal spike in his blood.  His Kappa light chain was 0.7 mg/dL.  His IgA level was 25 mg/dL.  Overall, I would say that his performance status is probably ECOG 0.    Medications:  Current Outpatient Medications:    acyclovir (ZOVIRAX) 400 MG tablet, Take 400 mg by mouth 2 (two) times daily., Disp: , Rfl:    glipiZIDE (GLUCOTROL) 5 MG tablet, Take 5 mg by mouth 2 (two) times daily before a meal., Disp: , Rfl:    lactulose (CHRONULAC) 10 GM/15ML solution, TAKE 15 MLS (10 G TOTAL) BY MOUTH 3 (THREE) TIMES DAILY. (Patient taking differently: Take 10 g by mouth daily as needed.), Disp: 946 mL, Rfl: 2   levothyroxine (SYNTHROID) 50 MCG tablet, Take 50 mcg by mouth daily before breakfast. (Patient not taking: Reported on 04/22/2023), Disp: , Rfl:    metFORMIN (GLUCOPHAGE) 500 MG tablet, Take 500 mg by mouth 2 (two) times daily with a meal., Disp: , Rfl:    tamsulosin (FLOMAX) 0.4 MG CAPS capsule, Take 0.4 mg by mouth daily., Disp: , Rfl:   Allergies:  Allergies  Allergen Reactions   Revlimid [Lenalidomide] Rash    Past Medical History,  Surgical history, Social history, and Family History were reviewed and updated.  Review of Systems: Review of Systems  Constitutional: Negative.   HENT:  Negative.    Eyes: Negative.   Respiratory: Negative.    Cardiovascular: Negative.   Gastrointestinal: Negative.   Endocrine: Negative.   Genitourinary:  Positive for difficulty urinating. Negative for bladder incontinence.   Musculoskeletal:  Positive for neck pain.  Skin: Negative.   Neurological: Negative.   Hematological: Negative.   Psychiatric/Behavioral: Negative.      Physical Exam:  height is 5\' 10"  (1.778 m) and weight is 176 lb (79.8 kg). His oral temperature is 97.8 F (36.6 C). His blood pressure is 138/61 and his pulse is 66. His respiration is 18 and oxygen saturation is 100%.   Wt Readings from Last 3 Encounters:  05/20/23 176 lb (79.8 kg)  04/22/23 178 lb (80.7 kg)  02/09/23 176 lb (79.8 kg)    Physical Exam Vitals reviewed.  HENT:     Head: Normocephalic and atraumatic.  Eyes:     Pupils: Pupils are equal, round, and reactive to light.  Neck:     Comments: His neck exam does show the healing cervical scar on the back of the neck.  This is a little bit firm.  He has no obvious adenopathy in the neck.  Cardiovascular:  Rate and Rhythm: Normal rate and regular rhythm.     Heart sounds: Normal heart sounds.  Pulmonary:     Effort: Pulmonary effort is normal.     Breath sounds: Normal breath sounds.  Abdominal:     General: Bowel sounds are normal.     Palpations: Abdomen is soft.  Musculoskeletal:        General: No tenderness or deformity. Normal range of motion.     Cervical back: Normal range of motion.  Lymphadenopathy:     Cervical: No cervical adenopathy.  Skin:    General: Skin is warm and dry.     Findings: No erythema or rash.  Neurological:     Mental Status: He is alert and oriented to person, place, and time.  Psychiatric:        Behavior: Behavior normal.        Thought Content:  Thought content normal.        Judgment: Judgment normal.     Lab Results  Component Value Date   WBC 5.1 05/20/2023   HGB 12.9 (L) 05/20/2023   HCT 38.1 (L) 05/20/2023   MCV 94.8 05/20/2023   PLT 166 05/20/2023     Chemistry      Component Value Date/Time   NA 143 05/20/2023 0933   K 4.0 05/20/2023 0933   CL 107 05/20/2023 0933   CO2 25 05/20/2023 0933   BUN 15 05/20/2023 0933   CREATININE 1.27 (H) 05/20/2023 0933      Component Value Date/Time   CALCIUM 9.8 05/20/2023 0933   ALKPHOS 43 05/20/2023 0933   AST 11 (L) 05/20/2023 0933   ALT 8 05/20/2023 0933   BILITOT 0.7 05/20/2023 0933      Impression and Plan: Barry Gray is a very nice 67 year old white male.  He has IgA kappa myeloma.  Most of his myeloma is Kappa light chain.  Again, his light chains have come down incredibly well with chemotherapy.  We then got him to transplant.  We will get him started on maintenance therapy.  This will be his second cycle of Faspro/Velcade.  After this cycle, we will then just go with Faspro.  He will have to get his Zometa today.  Will plan to get him back to see [place] in a month.   Josph Macho, MD 7/5/202410:20 AM

## 2023-05-20 NOTE — Addendum Note (Signed)
Addended by: Arlan Organ R on: 05/20/2023 11:10 AM   Modules accepted: Orders

## 2023-05-20 NOTE — Patient Instructions (Signed)
Bortezomib Injection What is this medication? BORTEZOMIB (bor TEZ oh mib) treats lymphoma. It may also be used to treat multiple myeloma, a type of bone marrow cancer. It works by blocking a protein that causes cancer cells to grow and multiply. This helps to slow or stop the spread of cancer cells. This medicine may be used for other purposes; ask your health care provider or pharmacist if you have questions. COMMON BRAND NAME(S): Velcade What should I tell my care team before I take this medication? They need to know if you have any of these conditions: Dehydration Diabetes Heart disease Liver disease Tingling of the fingers or toes or other nerve disorder An unusual or allergic reaction to bortezomib, other medications, foods, dyes, or preservatives If you or your partner are pregnant or trying to get pregnant Breastfeeding How should I use this medication? This medication is injected into a vein or under the skin. It is given by your care team in a hospital or clinic setting. Talk to your care team about the use of this medication in children. Special care may be needed. Overdosage: If you think you have taken too much of this medicine contact a poison control center or emergency room at once. NOTE: This medicine is only for you. Do not share this medicine with others. What if I miss a dose? Keep appointments for follow-up doses. It is important not to miss your dose. Call your care team if you are unable to keep an appointment. What may interact with this medication? Ketoconazole Rifampin This list may not describe all possible interactions. Give your health care provider a list of all the medicines, herbs, non-prescription drugs, or dietary supplements you use. Also tell them if you smoke, drink alcohol, or use illegal drugs. Some items may interact with your medicine. What should I watch for while using this medication? Your condition will be monitored carefully while you are  receiving this medication. You may need blood work while taking this medication. This medication may affect your coordination, reaction time, or judgment. Do not drive or operate machinery until you know how this medication affects you. Sit up or stand slowly to reduce the risk of dizzy or fainting spells. Drinking alcohol with this medication can increase the risk of these side effects. This medication may increase your risk of getting an infection. Call your care team for advice if you get a fever, chills, sore throat, or other symptoms of a cold or flu. Do not treat yourself. Try to avoid being around people who are sick. Check with your care team if you have severe diarrhea, nausea, and vomiting, or if you sweat a lot. The loss of too much body fluid may make it dangerous for you to take this medication. Talk to your care team if you may be pregnant. Serious birth defects can occur if you take this medication during pregnancy and for 7 months after the last dose. You will need a negative pregnancy test before starting this medication. Contraception is recommended while taking this medication and for 7 months after the last dose. Your care team can help you find the option that works for you. If your partner can get pregnant, use a condom during sex while taking this medication and for 4 months after the last dose. Do not breastfeed while taking this medication and for 2 months after the last dose. This medication may cause infertility. Talk to your care team if you are concerned about your fertility. What side effects  may I notice from receiving this medication? Side effects that you should report to your care team as soon as possible: Allergic reactions--skin rash, itching, hives, swelling of the face, lips, tongue, or throat Bleeding--bloody or black, tar-like stools, vomiting blood or brown material that looks like coffee grounds, red or dark brown urine, small red or purple spots on skin, unusual  bruising or bleeding Bleeding in the brain--severe headache, stiff neck, confusion, dizziness, change in vision, numbness or weakness of the face, arm, or leg, trouble speaking, trouble walking, vomiting Bowel blockage--stomach cramping, unable to have a bowel movement or pass gas, loss of appetite, vomiting Heart failure--shortness of breath, swelling of the ankles, feet, or hands, sudden weight gain, unusual weakness or fatigue Infection--fever, chills, cough, sore throat, wounds that don't heal, pain or trouble when passing urine, general feeling of discomfort or being unwell Liver injury--right upper belly pain, loss of appetite, nausea, light-colored stool, dark yellow or brown urine, yellowing skin or eyes, unusual weakness or fatigue Low blood pressure--dizziness, feeling faint or lightheaded, blurry vision Lung injury--shortness of breath or trouble breathing, cough, spitting up blood, chest pain, fever Pain, tingling, or numbness in the hands or feet Severe or prolonged diarrhea Stomach pain, bloody diarrhea, pale skin, unusual weakness or fatigue, decrease in the amount of urine, which may be signs of hemolytic uremic syndrome Sudden and severe headache, confusion, change in vision, seizures, which may be signs of posterior reversible encephalopathy syndrome (PRES) TTP--purple spots on the skin or inside the mouth, pale skin, yellowing skin or eyes, unusual weakness or fatigue, fever, fast or irregular heartbeat, confusion, change in vision, trouble speaking, trouble walking Tumor lysis syndrome (TLS)--nausea, vomiting, diarrhea, decrease in the amount of urine, dark urine, unusual weakness or fatigue, confusion, muscle pain or cramps, fast or irregular heartbeat, joint pain Side effects that usually do not require medical attention (report to your care team if they continue or are bothersome): Constipation Diarrhea Fatigue Loss of appetite Nausea This list may not describe all possible  side effects. Call your doctor for medical advice about side effects. You may report side effects to FDA at 1-800-FDA-1088. Where should I keep my medication? This medication is given in a hospital or clinic. It will not be stored at home. NOTE: This sheet is a summary. It may not cover all possible information. If you have questions about this medicine, talk to your doctor, pharmacist, or health care provider.  2024 [employer] Standard (2022-04-06 00:00:00)  Daratumumab Injection What is this medication? DARATUMUMAB (dar a toom ue mab) treats multiple myeloma, a type of bone marrow cancer. It works by helping your immune system slow or stop the spread of cancer cells. It is a monoclonal antibody. This medicine may be used for other purposes; ask your health care provider or pharmacist if you have questions. COMMON BRAND NAME(S): DARZALEX What should I tell my care team before I take this medication? They need to know if you have any of these conditions: Hereditary fructose intolerance Infection, such as chickenpox, herpes, hepatitis B Lung or breathing disease, such as asthma, COPD An unusual or allergic reaction to daratumumab, sorbitol, other medications, foods, dyes, or preservatives Pregnant or trying to get pregnant Breastfeeding How should I use this medication? This medication is injected into a vein. It is given by your care team in a hospital or clinic setting. Talk to your care team about the use of this medication in children. Special care may be needed. Overdosage: If you think you  have taken too much of this medicine contact a poison control center or emergency room at once. NOTE: This medicine is only for you. Do not share this medicine with others. What if I miss a dose? Keep appointments for follow-up doses. It is important not to miss your dose. Call your care team if you are unable to keep an appointment. What may interact with this medication? Interactions have not  been studied. This list may not describe all possible interactions. Give your health care provider a list of all the medicines, herbs, non-prescription drugs, or dietary supplements you use. Also tell them if you smoke, drink alcohol, or use illegal drugs. Some items may interact with your medicine. What should I watch for while using this medication? Your condition will be monitored carefully while you are receiving this medication. This medication can cause serious allergic reactions. To reduce your risk, your care team may give you other medication to take before receiving this one. Be sure to follow the directions from your care team. This medication can affect the results of blood tests to match your blood type. These changes can last for up to 6 months after the final dose. Your care team will do blood tests to match your blood type before you start treatment. Tell all of your care team that you are being treated with this medication before receiving a blood transfusion. This medication can affect the results of some tests used to determine treatment response; extra tests may be needed to evaluate response. Talk to your care team if you wish to become pregnant or think you are pregnant. This medication can cause serious birth defects if taken during pregnancy and for 3 months after the last dose. A reliable form of contraception is recommended while taking this medication and for 3 months after the last dose. Talk to your care team about effective forms of contraception. Do not breast-feed while taking this medication. What side effects may I notice from receiving this medication? Side effects that you should report to your care team as soon as possible: Allergic reactions--skin rash, itching, hives, swelling of the face, lips, tongue, or throat Infection--fever, chills, cough, sore throat, wounds that don't heal, pain or trouble when passing urine, general feeling of discomfort or being  unwell Infusion reactions--chest pain, shortness of breath or trouble breathing, feeling faint or lightheaded Unusual bruising or bleeding Side effects that usually do not require medical attention (report to your care team if they continue or are bothersome): Constipation Diarrhea Fatigue Nausea Pain, tingling, or numbness in the hands or feet Swelling of the ankles, hands, or feet This list may not describe all possible side effects. Call your doctor for medical advice about side effects. You may report side effects to FDA at 1-800-FDA-1088. Where should I keep my medication? This medication is given in a hospital or clinic. It will not be stored at home. NOTE: This sheet is a summary. It may not cover all possible information. If you have questions about this medicine, talk to your doctor, pharmacist, or health care provider.  2024 [employer] Standard (2022-09-09 00:00:00)  Zoledronic Acid Injection (Cancer) What is this medication? ZOLEDRONIC ACID (ZOE le dron ik AS id) treats high calcium levels in the blood caused by cancer. It may also be used with chemotherapy to treat weakened bones caused by cancer. It works by slowing down the release of calcium from bones. This lowers calcium levels in your blood. It also makes your bones stronger and less likely  to break (fracture). It belongs to a group of medications called bisphosphonates. This medicine may be used for other purposes; ask your health care provider or pharmacist if you have questions. COMMON BRAND NAME(S): Zometa, Zometa Powder What should I tell my care team before I take this medication? They need to know if you have any of these conditions: Dehydration Dental disease Kidney disease Liver disease Low levels of calcium in the blood Lung or breathing disease, such as asthma Receiving steroids, such as dexamethasone or prednisone An unusual or allergic reaction to zoledronic acid, other medications, foods, dyes, or  preservatives Pregnant or trying to get pregnant Breast-feeding How should I use this medication? This medication is injected into a vein. It is given by your care team in a hospital or clinic setting. Talk to your care team about the use of this medication in children. Special care may be needed. Overdosage: If you think you have taken too much of this medicine contact a poison control center or emergency room at once. NOTE: This medicine is only for you. Do not share this medicine with others. What if I miss a dose? Keep appointments for follow-up doses. It is important not to miss your dose. Call your care team if you are unable to keep an appointment. What may interact with this medication? Certain antibiotics given by injection Diuretics, such as bumetanide, furosemide NSAIDs, medications for pain and inflammation, such as ibuprofen or naproxen Teriparatide Thalidomide This list may not describe all possible interactions. Give your health care provider a list of all the medicines, herbs, non-prescription drugs, or dietary supplements you use. Also tell them if you smoke, drink alcohol, or use illegal drugs. Some items may interact with your medicine. What should I watch for while using this medication? Visit your care team for regular checks on your progress. It may be some time before you see the benefit from this medication. Some people who take this medication have severe bone, joint, or muscle pain. This medication may also increase your risk for jaw problems or a broken thigh bone. Tell your care team right away if you have severe pain in your jaw, bones, joints, or muscles. Tell you care team if you have any pain that does not go away or that gets worse. Tell your dentist and dental surgeon that you are taking this medication. You should not have major dental surgery while on this medication. See your dentist to have a dental exam and fix any dental problems before starting this  medication. Take good care of your teeth while on this medication. Make sure you see your dentist for regular follow-up appointments. You should make sure you get enough calcium and vitamin D while you are taking this medication. Discuss the foods you eat and the vitamins you take with your care team. Check with your care team if you have severe diarrhea, nausea, and vomiting, or if you sweat a lot. The loss of too much body fluid may make it dangerous for you to take this medication. You may need bloodwork while taking this medication. Talk to your care team if you wish to become pregnant or think you might be pregnant. This medication can cause serious birth defects. What side effects may I notice from receiving this medication? Side effects that you should report to your care team as soon as possible: Allergic reactions--skin rash, itching, hives, swelling of the face, lips, tongue, or throat Kidney injury--decrease in the amount of urine, swelling of the ankles,  hands, or feet Low calcium level--muscle pain or cramps, confusion, tingling, or numbness in the hands or feet Osteonecrosis of the jaw--pain, swelling, or redness in the mouth, numbness of the jaw, poor healing after dental work, unusual discharge from the mouth, visible bones in the mouth Severe bone, joint, or muscle pain Side effects that usually do not require medical attention (report to your care team if they continue or are bothersome): Constipation Fatigue Fever Loss of appetite Nausea Stomach pain This list may not describe all possible side effects. Call your doctor for medical advice about side effects. You may report side effects to FDA at 1-800-FDA-1088. Where should I keep my medication? This medication is given in a hospital or clinic. It will not be stored at home. NOTE: This sheet is a summary. It may not cover all possible information. If you have questions about this medicine, talk to your doctor, pharmacist, or  health care provider.  2024 [employer] Standard (2021-12-25 00:00:00)

## 2023-05-22 LAB — IGG, IGA, IGM
IgA: 19 mg/dL — ABNORMAL LOW (ref 61–437)
IgG (Immunoglobin G), Serum: 254 mg/dL — ABNORMAL LOW (ref 603–1613)
IgM (Immunoglobulin M), Srm: 7 mg/dL — ABNORMAL LOW (ref 20–172)

## 2023-05-23 LAB — KAPPA/LAMBDA LIGHT CHAINS
Kappa free light chain: 4.7 mg/L (ref 3.3–19.4)
Kappa, lambda light chain ratio: 1.62 (ref 0.26–1.65)
Lambda free light chains: 2.9 mg/L — ABNORMAL LOW (ref 5.7–26.3)

## 2023-05-27 ENCOUNTER — Other Ambulatory Visit: Payer: Self-pay

## 2023-05-27 ENCOUNTER — Inpatient Hospital Stay: Payer: Medicare Other

## 2023-05-27 VITALS — BP 129/66 | HR 66 | Temp 97.8°F | Resp 18

## 2023-05-27 DIAGNOSIS — C9000 Multiple myeloma not having achieved remission: Secondary | ICD-10-CM

## 2023-05-27 DIAGNOSIS — Z5112 Encounter for antineoplastic immunotherapy: Secondary | ICD-10-CM | POA: Diagnosis not present

## 2023-05-27 LAB — CBC WITH DIFFERENTIAL (CANCER CENTER ONLY)
Abs Immature Granulocytes: 0.02 10*3/uL (ref 0.00–0.07)
Basophils Absolute: 0 10*3/uL (ref 0.0–0.1)
Basophils Relative: 0 %
Eosinophils Absolute: 0.2 10*3/uL (ref 0.0–0.5)
Eosinophils Relative: 3 %
HCT: 38.4 % — ABNORMAL LOW (ref 39.0–52.0)
Hemoglobin: 12.8 g/dL — ABNORMAL LOW (ref 13.0–17.0)
Immature Granulocytes: 0 %
Lymphocytes Relative: 12 %
Lymphs Abs: 0.8 10*3/uL (ref 0.7–4.0)
MCH: 31.5 pg (ref 26.0–34.0)
MCHC: 33.3 g/dL (ref 30.0–36.0)
MCV: 94.6 fL (ref 80.0–100.0)
Monocytes Absolute: 0.7 10*3/uL (ref 0.1–1.0)
Monocytes Relative: 11 %
Neutro Abs: 5 10*3/uL (ref 1.7–7.7)
Neutrophils Relative %: 74 %
Platelet Count: 156 10*3/uL (ref 150–400)
RBC: 4.06 MIL/uL — ABNORMAL LOW (ref 4.22–5.81)
RDW: 13 % (ref 11.5–15.5)
WBC Count: 6.7 10*3/uL (ref 4.0–10.5)
nRBC: 0 % (ref 0.0–0.2)

## 2023-05-27 LAB — CMP (CANCER CENTER ONLY)
ALT: 7 U/L (ref 0–44)
AST: 9 U/L — ABNORMAL LOW (ref 15–41)
Albumin: 4.5 g/dL (ref 3.5–5.0)
Alkaline Phosphatase: 44 U/L (ref 38–126)
Anion gap: 8 (ref 5–15)
BUN: 17 mg/dL (ref 8–23)
CO2: 28 mmol/L (ref 22–32)
Calcium: 9.5 mg/dL (ref 8.9–10.3)
Chloride: 105 mmol/L (ref 98–111)
Creatinine: 1.34 mg/dL — ABNORMAL HIGH (ref 0.61–1.24)
GFR, Estimated: 58 mL/min — ABNORMAL LOW (ref >60)
Glucose, Bld: 106 mg/dL — ABNORMAL HIGH (ref 70–99)
Potassium: 4.9 mmol/L (ref 3.5–5.1)
Sodium: 141 mmol/L (ref 135–145)
Total Bilirubin: 0.6 mg/dL (ref 0.3–1.2)
Total Protein: 6.6 g/dL (ref 6.5–8.1)

## 2023-05-27 MED ORDER — BORTEZOMIB CHEMO SQ INJECTION 3.5 MG (2.5MG/ML)
1.3000 mg/m2 | Freq: Once | INTRAMUSCULAR | Status: AC
  Administered 2023-05-27: 2.75 mg via SUBCUTANEOUS
  Filled 2023-05-27: qty 1.1

## 2023-05-27 NOTE — Patient Instructions (Signed)
Bortezomib Injection What is this medication? BORTEZOMIB (bor TEZ oh mib) treats lymphoma. It may also be used to treat multiple myeloma, a type of bone marrow cancer. It works by blocking a protein that causes cancer cells to grow and multiply. This helps to slow or stop the spread of cancer cells. This medicine may be used for other purposes; ask your health care provider or pharmacist if you have questions. COMMON BRAND NAME(S): Velcade What should I tell my care team before I take this medication? They need to know if you have any of these conditions: Dehydration Diabetes Heart disease Liver disease Tingling of the fingers or toes or other nerve disorder An unusual or allergic reaction to bortezomib, other medications, foods, dyes, or preservatives If you or your partner are pregnant or trying to get pregnant Breastfeeding How should I use this medication? This medication is injected into a vein or under the skin. It is given by your care team in a hospital or clinic setting. Talk to your care team about the use of this medication in children. Special care may be needed. Overdosage: If you think you have taken too much of this medicine contact a poison control center or emergency room at once. NOTE: This medicine is only for you. Do not share this medicine with others. What if I miss a dose? Keep appointments for follow-up doses. It is important not to miss your dose. Call your care team if you are unable to keep an appointment. What may interact with this medication? Ketoconazole Rifampin This list may not describe all possible interactions. Give your health care provider a list of all the medicines, herbs, non-prescription drugs, or dietary supplements you use. Also tell them if you smoke, drink alcohol, or use illegal drugs. Some items may interact with your medicine. What should I watch for while using this medication? Your condition will be monitored carefully while you are  receiving this medication. You may need blood work while taking this medication. This medication may affect your coordination, reaction time, or judgment. Do not drive or operate machinery until you know how this medication affects you. Sit up or stand slowly to reduce the risk of dizzy or fainting spells. Drinking alcohol with this medication can increase the risk of these side effects. This medication may increase your risk of getting an infection. Call your care team for advice if you get a fever, chills, sore throat, or other symptoms of a cold or flu. Do not treat yourself. Try to avoid being around people who are sick. Check with your care team if you have severe diarrhea, nausea, and vomiting, or if you sweat a lot. The loss of too much body fluid may make it dangerous for you to take this medication. Talk to your care team if you may be pregnant. Serious birth defects can occur if you take this medication during pregnancy and for 7 months after the last dose. You will need a negative pregnancy test before starting this medication. Contraception is recommended while taking this medication and for 7 months after the last dose. Your care team can help you find the option that works for you. If your partner can get pregnant, use a condom during sex while taking this medication and for 4 months after the last dose. Do not breastfeed while taking this medication and for 2 months after the last dose. This medication may cause infertility. Talk to your care team if you are concerned about your fertility. What side effects   may I notice from receiving this medication? Side effects that you should report to your care team as soon as possible: Allergic reactions--skin rash, itching, hives, swelling of the face, lips, tongue, or throat Bleeding--bloody or black, tar-like stools, vomiting blood or brown material that looks like coffee grounds, red or dark brown urine, small red or purple spots on skin, unusual  bruising or bleeding Bleeding in the brain--severe headache, stiff neck, confusion, dizziness, change in vision, numbness or weakness of the face, arm, or leg, trouble speaking, trouble walking, vomiting Bowel blockage--stomach cramping, unable to have a bowel movement or pass gas, loss of appetite, vomiting Heart failure--shortness of breath, swelling of the ankles, feet, or hands, sudden weight gain, unusual weakness or fatigue Infection--fever, chills, cough, sore throat, wounds that don't heal, pain or trouble when passing urine, general feeling of discomfort or being unwell Liver injury--right upper belly pain, loss of appetite, nausea, light-colored stool, dark yellow or brown urine, yellowing skin or eyes, unusual weakness or fatigue Low blood pressure--dizziness, feeling faint or lightheaded, blurry vision Lung injury--shortness of breath or trouble breathing, cough, spitting up blood, chest pain, fever Pain, tingling, or numbness in the hands or feet Severe or prolonged diarrhea Stomach pain, bloody diarrhea, pale skin, unusual weakness or fatigue, decrease in the amount of urine, which may be signs of hemolytic uremic syndrome Sudden and severe headache, confusion, change in vision, seizures, which may be signs of posterior reversible encephalopathy syndrome (PRES) TTP--purple spots on the skin or inside the mouth, pale skin, yellowing skin or eyes, unusual weakness or fatigue, fever, fast or irregular heartbeat, confusion, change in vision, trouble speaking, trouble walking Tumor lysis syndrome (TLS)--nausea, vomiting, diarrhea, decrease in the amount of urine, dark urine, unusual weakness or fatigue, confusion, muscle pain or cramps, fast or irregular heartbeat, joint pain Side effects that usually do not require medical attention (report to your care team if they continue or are bothersome): Constipation Diarrhea Fatigue Loss of appetite Nausea This list may not describe all possible  side effects. Call your doctor for medical advice about side effects. You may report side effects to FDA at 1-800-FDA-1088. Where should I keep my medication? This medication is given in a hospital or clinic. It will not be stored at home. NOTE: This sheet is a summary. It may not cover all possible information. If you have questions about this medicine, talk to your doctor, pharmacist, or health care provider.  2024 [employer] Standard (2022-04-06 00:00:00)  

## 2023-05-29 ENCOUNTER — Other Ambulatory Visit: Payer: Self-pay

## 2023-05-31 ENCOUNTER — Other Ambulatory Visit: Payer: Self-pay

## 2023-05-31 LAB — PROTEIN ELECTROPHORESIS, SERUM, WITH REFLEX
A/G Ratio: 1.5 (ref 0.7–1.7)
Albumin ELP: 3.7 g/dL (ref 2.9–4.4)
Alpha-1-Globulin: 0.3 g/dL (ref 0.0–0.4)
Alpha-2-Globulin: 1.1 g/dL — ABNORMAL HIGH (ref 0.4–1.0)
Beta Globulin: 0.9 g/dL (ref 0.7–1.3)
Gamma Globulin: 0.2 g/dL — ABNORMAL LOW (ref 0.4–1.8)
Globulin, Total: 2.5 g/dL (ref 2.2–3.9)
SPEP Interpretation: 0
Total Protein ELP: 6.2 g/dL (ref 6.0–8.5)

## 2023-05-31 LAB — IMMUNOFIXATION REFLEX, SERUM
IgA: 26 mg/dL — ABNORMAL LOW (ref 61–437)
IgG (Immunoglobin G), Serum: 309 mg/dL — ABNORMAL LOW (ref 603–1613)
IgM (Immunoglobulin M), Srm: 9 mg/dL — ABNORMAL LOW (ref 20–172)

## 2023-06-03 ENCOUNTER — Inpatient Hospital Stay: Payer: Medicare Other

## 2023-06-09 DIAGNOSIS — E079 Disorder of thyroid, unspecified: Secondary | ICD-10-CM | POA: Insufficient documentation

## 2023-06-09 DIAGNOSIS — E119 Type 2 diabetes mellitus without complications: Secondary | ICD-10-CM | POA: Insufficient documentation

## 2023-06-10 ENCOUNTER — Inpatient Hospital Stay: Payer: Medicare Other

## 2023-06-10 ENCOUNTER — Encounter: Payer: Self-pay | Admitting: Hematology & Oncology

## 2023-06-10 ENCOUNTER — Other Ambulatory Visit: Payer: Self-pay

## 2023-06-10 ENCOUNTER — Inpatient Hospital Stay (HOSPITAL_BASED_OUTPATIENT_CLINIC_OR_DEPARTMENT_OTHER): Payer: Medicare Other | Admitting: Hematology & Oncology

## 2023-06-10 DIAGNOSIS — C9000 Multiple myeloma not having achieved remission: Secondary | ICD-10-CM

## 2023-06-10 DIAGNOSIS — Z5112 Encounter for antineoplastic immunotherapy: Secondary | ICD-10-CM | POA: Diagnosis not present

## 2023-06-10 LAB — CBC WITH DIFFERENTIAL (CANCER CENTER ONLY)
Abs Immature Granulocytes: 0.01 10*3/uL (ref 0.00–0.07)
Basophils Absolute: 0 10*3/uL (ref 0.0–0.1)
Basophils Relative: 1 %
Eosinophils Absolute: 0.1 10*3/uL (ref 0.0–0.5)
Eosinophils Relative: 2 %
HCT: 37.7 % — ABNORMAL LOW (ref 39.0–52.0)
Hemoglobin: 12.6 g/dL — ABNORMAL LOW (ref 13.0–17.0)
Immature Granulocytes: 0 %
Lymphocytes Relative: 15 %
Lymphs Abs: 0.8 10*3/uL (ref 0.7–4.0)
MCH: 31.6 pg (ref 26.0–34.0)
MCHC: 33.4 g/dL (ref 30.0–36.0)
MCV: 94.5 fL (ref 80.0–100.0)
Monocytes Absolute: 0.5 10*3/uL (ref 0.1–1.0)
Monocytes Relative: 8 %
Neutro Abs: 4 10*3/uL (ref 1.7–7.7)
Neutrophils Relative %: 74 %
Platelet Count: 182 10*3/uL (ref 150–400)
RBC: 3.99 MIL/uL — ABNORMAL LOW (ref 4.22–5.81)
RDW: 13.2 % (ref 11.5–15.5)
WBC Count: 5.5 10*3/uL (ref 4.0–10.5)
nRBC: 0 % (ref 0.0–0.2)

## 2023-06-10 LAB — CMP (CANCER CENTER ONLY)
ALT: 8 U/L (ref 0–44)
AST: 11 U/L — ABNORMAL LOW (ref 15–41)
Albumin: 4.5 g/dL (ref 3.5–5.0)
Alkaline Phosphatase: 40 U/L (ref 38–126)
Anion gap: 9 (ref 5–15)
BUN: 15 mg/dL (ref 8–23)
CO2: 24 mmol/L (ref 22–32)
Calcium: 9.5 mg/dL (ref 8.9–10.3)
Chloride: 109 mmol/L (ref 98–111)
Creatinine: 1.28 mg/dL — ABNORMAL HIGH (ref 0.61–1.24)
GFR, Estimated: >60 mL/min (ref >60)
Glucose, Bld: 116 mg/dL — ABNORMAL HIGH (ref 70–99)
Potassium: 5 mmol/L (ref 3.5–5.1)
Sodium: 142 mmol/L (ref 135–145)
Total Bilirubin: 0.7 mg/dL (ref 0.3–1.2)
Total Protein: 6.4 g/dL — ABNORMAL LOW (ref 6.5–8.1)

## 2023-06-10 LAB — LACTATE DEHYDROGENASE: LDH: 91 U/L — ABNORMAL LOW (ref 98–192)

## 2023-06-10 MED ORDER — DIPHENHYDRAMINE HCL 25 MG PO CAPS
50.0000 mg | ORAL_CAPSULE | Freq: Once | ORAL | Status: AC
  Administered 2023-06-10: 50 mg via ORAL
  Filled 2023-06-10: qty 2

## 2023-06-10 MED ORDER — ACETAMINOPHEN 325 MG PO TABS
650.0000 mg | ORAL_TABLET | Freq: Once | ORAL | Status: AC
  Administered 2023-06-10: 650 mg via ORAL
  Filled 2023-06-10: qty 2

## 2023-06-10 MED ORDER — BORTEZOMIB CHEMO SQ INJECTION 3.5 MG (2.5MG/ML)
1.3000 mg/m2 | Freq: Once | INTRAMUSCULAR | Status: AC
  Administered 2023-06-10: 2.75 mg via SUBCUTANEOUS
  Filled 2023-06-10: qty 1.1

## 2023-06-10 MED ORDER — DARATUMUMAB-HYALURONIDASE-FIHJ 1800-30000 MG-UT/15ML ~~LOC~~ SOLN
1800.0000 mg | Freq: Once | SUBCUTANEOUS | Status: AC
  Administered 2023-06-10: 1800 mg via SUBCUTANEOUS
  Filled 2023-06-10: qty 15

## 2023-06-10 NOTE — Progress Notes (Signed)
Hematology and Oncology Follow Up Visit  Barry Gray [id number] 24-Apr-1956 67 y.o. 06/10/2023   Principle Diagnosis:  IgA Kappa myeloma -- normal cytogenetics/FISH (-)  Current Therapy:   S/p cervical spine stabilization -- 04/28/2022 Faspro/Velcade/Decadron -- s/p cycle #6-- start on 05/20/2022 Zometa 4 mg IV q 3 months -- next dose on 07/2023 Status post autologous stem cell transplant -- [facility] on 12/03/2022 Faspro/Velcade -- maintenance -  start on 04/28/2023     Interim History:  Barry Gray is back for follow-up.  He is doing quite well.  He is quite active.  He likes to ride his bike.  He does yard work.  Barry Gray last saw him, we did do a 24-hour urine on him.  This did not show any monoclonal spike in the urine.  When we did his last myeloma studies, there was no monoclonal spike in his blood.  His IgM level was only 23 mg/dL.  The Kappa light chain was 0.5 mg/dL.  He has had no problems with fever.  He has had no change in bowel or bladder habits.  He has had no cough or shortness of breath.  There has been no rashes.  He has had no leg swelling.  He has had no headache.  Overall, I would say his performance status is ECOG 1.   Medications:  Current Outpatient Medications:    acyclovir (ZOVIRAX) 400 MG tablet, Take 400 mg by mouth 2 (two) times daily., Disp: , Rfl:    glipiZIDE (GLUCOTROL) 5 MG tablet, Take 5 mg by mouth 2 (two) times daily before a meal., Disp: , Rfl:    lactulose (CHRONULAC) 10 GM/15ML solution, TAKE 15 MLS (10 G TOTAL) BY MOUTH 3 (THREE) TIMES DAILY. (Patient taking differently: Take 10 g by mouth daily as needed.), Disp: 946 mL, Rfl: 2   levothyroxine (SYNTHROID) 50 MCG tablet, Take 50 mcg by mouth daily before breakfast. (Patient not taking: Reported on 04/22/2023), Disp: , Rfl:    metFORMIN (GLUCOPHAGE) 500 MG tablet, Take 500 mg by mouth 2 (two) times daily with a meal., Disp: , Rfl:    tamsulosin (FLOMAX) 0.4 MG CAPS capsule, Take 0.4 mg by mouth  daily., Disp: , Rfl:   Allergies:  Allergies  Allergen Reactions   Revlimid [Lenalidomide] Rash    Past Medical History, Surgical history, Social history, and Family History were reviewed and updated.  Review of Systems: Review of Systems  Constitutional: Negative.   HENT:  Negative.    Eyes: Negative.   Respiratory: Negative.    Cardiovascular: Negative.   Gastrointestinal: Negative.   Endocrine: Negative.   Genitourinary:  Positive for difficulty urinating. Negative for bladder incontinence.   Musculoskeletal:  Positive for neck pain.  Skin: Negative.   Neurological: Negative.   Hematological: Negative.   Psychiatric/Behavioral: Negative.      Physical Exam:  height is 5\' 10"  (1.778 m) and weight is 173 lb (78.5 kg). His oral temperature is 98.2 F (36.8 C). His blood pressure is 126/70 and his pulse is 70. His respiration is 18 and oxygen saturation is 100%.   Wt Readings from Last 3 Encounters:  06/10/23 173 lb (78.5 kg)  05/20/23 176 lb (79.8 kg)  04/22/23 178 lb (80.7 kg)    Physical Exam Vitals reviewed.  HENT:     Head: Normocephalic and atraumatic.  Eyes:     Pupils: Pupils are equal, round, and reactive to light.  Neck:     Comments: His neck exam does show the  healing cervical scar on the back of the neck.  This is a little bit firm.  He has no obvious adenopathy in the neck.  Cardiovascular:     Rate and Rhythm: Normal rate and regular rhythm.     Heart sounds: Normal heart sounds.  Pulmonary:     Effort: Pulmonary effort is normal.     Breath sounds: Normal breath sounds.  Abdominal:     General: Bowel sounds are normal.     Palpations: Abdomen is soft.  Musculoskeletal:        General: No tenderness or deformity. Normal range of motion.     Cervical back: Normal range of motion.  Lymphadenopathy:     Cervical: No cervical adenopathy.  Skin:    General: Skin is warm and dry.     Findings: No erythema or rash.  Neurological:     Mental  Status: He is alert and oriented to person, place, and time.  Psychiatric:        Behavior: Behavior normal.        Thought Content: Thought content normal.        Judgment: Judgment normal.      Lab Results  Component Value Date   WBC 5.5 06/10/2023   HGB 12.6 (L) 06/10/2023   HCT 37.7 (L) 06/10/2023   MCV 94.5 06/10/2023   PLT 182 06/10/2023     Chemistry      Component Value Date/Time   NA 142 06/10/2023 0938   K 5.0 06/10/2023 0938   CL 109 06/10/2023 0938   CO2 24 06/10/2023 0938   BUN 15 06/10/2023 0938   CREATININE 1.28 (H) 06/10/2023 0938      Component Value Date/Time   CALCIUM 9.5 06/10/2023 0938   ALKPHOS 40 06/10/2023 0938   AST 11 (L) 06/10/2023 0938   ALT 8 06/10/2023 0938   BILITOT 0.7 06/10/2023 0938      Impression and Plan: Barry Gray is a very nice 67 year old white male.  He has IgA kappa myeloma.  Most of his myeloma is Kappa light chain.  Again, his light chains have come down incredibly well with chemotherapy.  We then got him to transplant.  He will continue on maintenance therapy.  He is still on the Faspro and Velcade.  We will continue him on both of these.  Probably, in September or October, we will just go with the Faspro.  I will plan to get him back in 1 month.    Barry Macho, MD 7/26/202410:58 AM

## 2023-06-10 NOTE — Patient Instructions (Signed)
Daratumumab Injection What is this medication? DARATUMUMAB (dar a toom ue mab) treats multiple myeloma, a type of bone marrow cancer. It works by helping your immune system slow or stop the spread of cancer cells. It is a monoclonal antibody. This medicine may be used for other purposes; ask your health care provider or pharmacist if you have questions. COMMON BRAND NAME(S): DARZALEX What should I tell my care team before I take this medication? They need to know if you have any of these conditions: Hereditary fructose intolerance Infection, such as chickenpox, herpes, hepatitis B Lung or breathing disease, such as asthma, COPD An unusual or allergic reaction to daratumumab, sorbitol, other medications, foods, dyes, or preservatives Pregnant or trying to get pregnant Breastfeeding How should I use this medication? This medication is injected into a vein. It is given by your care team in a hospital or clinic setting. Talk to your care team about the use of this medication in children. Special care may be needed. Overdosage: If you think you have taken too much of this medicine contact a poison control center or emergency room at once. NOTE: This medicine is only for you. Do not share this medicine with others. What if I miss a dose? Keep appointments for follow-up doses. It is important not to miss your dose. Call your care team if you are unable to keep an appointment. What may interact with this medication? Interactions have not been studied. This list may not describe all possible interactions. Give your health care provider a list of all the medicines, herbs, non-prescription drugs, or dietary supplements you use. Also tell them if you smoke, drink alcohol, or use illegal drugs. Some items may interact with your medicine. What should I watch for while using this medication? Your condition will be monitored carefully while you are receiving this medication. This medication can cause  serious allergic reactions. To reduce your risk, your care team may give you other medication to take before receiving this one. Be sure to follow the directions from your care team. This medication can affect the results of blood tests to match your blood type. These changes can last for up to 6 months after the final dose. Your care team will do blood tests to match your blood type before you start treatment. Tell all of your care team that you are being treated with this medication before receiving a blood transfusion. This medication can affect the results of some tests used to determine treatment response; extra tests may be needed to evaluate response. Talk to your care team if you wish to become pregnant or think you are pregnant. This medication can cause serious birth defects if taken during pregnancy and for 3 months after the last dose. A reliable form of contraception is recommended while taking this medication and for 3 months after the last dose. Talk to your care team about effective forms of contraception. Do not breast-feed while taking this medication. What side effects may I notice from receiving this medication? Side effects that you should report to your care team as soon as possible: Allergic reactions--skin rash, itching, hives, swelling of the face, lips, tongue, or throat Infection--fever, chills, cough, sore throat, wounds that don't heal, pain or trouble when passing urine, general feeling of discomfort or being unwell Infusion reactions--chest pain, shortness of breath or trouble breathing, feeling faint or lightheaded Unusual bruising or bleeding Side effects that usually do not require medical attention (report to your care team if they continue or  are bothersome): Constipation Diarrhea Fatigue Nausea Pain, tingling, or numbness in the hands or feet Swelling of the ankles, hands, or feet This list may not describe all possible side effects. Call your doctor for medical  advice about side effects. You may report side effects to FDA at 1-800-FDA-1088. Where should I keep my medication? This medication is given in a hospital or clinic. It will not be stored at home. NOTE: This sheet is a summary. It may not cover all possible information. If you have questions about this medicine, talk to your doctor, pharmacist, or health care provider.  2024 Elsevier/Gold Standard (2022-09-09 00:00:00) Bortezomib Injection What is this medication? BORTEZOMIB (bor TEZ oh mib) treats lymphoma. It may also be used to treat multiple myeloma, a type of bone marrow cancer. It works by blocking a protein that causes cancer cells to grow and multiply. This helps to slow or stop the spread of cancer cells. This medicine may be used for other purposes; ask your health care provider or pharmacist if you have questions. COMMON BRAND NAME(S): Velcade What should I tell my care team before I take this medication? They need to know if you have any of these conditions: Dehydration Diabetes Heart disease Liver disease Tingling of the fingers or toes or other nerve disorder An unusual or allergic reaction to bortezomib, other medications, foods, dyes, or preservatives If you or your partner are pregnant or trying to get pregnant Breastfeeding How should I use this medication? This medication is injected into a vein or under the skin. It is given by your care team in a hospital or clinic setting. Talk to your care team about the use of this medication in children. Special care may be needed. Overdosage: If you think you have taken too much of this medicine contact a poison control center or emergency room at once. NOTE: This medicine is only for you. Do not share this medicine with others. What if I miss a dose? Keep appointments for follow-up doses. It is important not to miss your dose. Call your care team if you are unable to keep an appointment. What may interact with this  medication? Ketoconazole Rifampin This list may not describe all possible interactions. Give your health care provider a list of all the medicines, herbs, non-prescription drugs, or dietary supplements you use. Also tell them if you smoke, drink alcohol, or use illegal drugs. Some items may interact with your medicine. What should I watch for while using this medication? Your condition will be monitored carefully while you are receiving this medication. You may need blood work while taking this medication. This medication may affect your coordination, reaction time, or judgment. Do not drive or operate machinery until you know how this medication affects you. Sit up or stand slowly to reduce the risk of dizzy or fainting spells. Drinking alcohol with this medication can increase the risk of these side effects. This medication may increase your risk of getting an infection. Call your care team for advice if you get a fever, chills, sore throat, or other symptoms of a cold or flu. Do not treat yourself. Try to avoid being around people who are sick. Check with your care team if you have severe diarrhea, nausea, and vomiting, or if you sweat a lot. The loss of too much body fluid may make it dangerous for you to take this medication. Talk to your care team if you may be pregnant. Serious birth defects can occur if you take this medication  during pregnancy and for 7 months after the last dose. You will need a negative pregnancy test before starting this medication. Contraception is recommended while taking this medication and for 7 months after the last dose. Your care team can help you find the option that works for you. If your partner can get pregnant, use a condom during sex while taking this medication and for 4 months after the last dose. Do not breastfeed while taking this medication and for 2 months after the last dose. This medication may cause infertility. Talk to your care team if you are  concerned about your fertility. What side effects may I notice from receiving this medication? Side effects that you should report to your care team as soon as possible: Allergic reactions--skin rash, itching, hives, swelling of the face, lips, tongue, or throat Bleeding--bloody or black, tar-like stools, vomiting blood or brown material that looks like coffee grounds, red or dark brown urine, small red or purple spots on skin, unusual bruising or bleeding Bleeding in the brain--severe headache, stiff neck, confusion, dizziness, change in vision, numbness or weakness of the face, arm, or leg, trouble speaking, trouble walking, vomiting Bowel blockage--stomach cramping, unable to have a bowel movement or pass gas, loss of appetite, vomiting Heart failure--shortness of breath, swelling of the ankles, feet, or hands, sudden weight gain, unusual weakness or fatigue Infection--fever, chills, cough, sore throat, wounds that don't heal, pain or trouble when passing urine, general feeling of discomfort or being unwell Liver injury--right upper belly pain, loss of appetite, nausea, light-colored stool, dark yellow or brown urine, yellowing skin or eyes, unusual weakness or fatigue Low blood pressure--dizziness, feeling faint or lightheaded, blurry vision Lung injury--shortness of breath or trouble breathing, cough, spitting up blood, chest pain, fever Pain, tingling, or numbness in the hands or feet Severe or prolonged diarrhea Stomach pain, bloody diarrhea, pale skin, unusual weakness or fatigue, decrease in the amount of urine, which may be signs of hemolytic uremic syndrome Sudden and severe headache, confusion, change in vision, seizures, which may be signs of posterior reversible encephalopathy syndrome (PRES) TTP--purple spots on the skin or inside the mouth, pale skin, yellowing skin or eyes, unusual weakness or fatigue, fever, fast or irregular heartbeat, confusion, change in vision, trouble speaking,  trouble walking Tumor lysis syndrome (TLS)--nausea, vomiting, diarrhea, decrease in the amount of urine, dark urine, unusual weakness or fatigue, confusion, muscle pain or cramps, fast or irregular heartbeat, joint pain Side effects that usually do not require medical attention (report to your care team if they continue or are bothersome): Constipation Diarrhea Fatigue Loss of appetite Nausea This list may not describe all possible side effects. Call your doctor for medical advice about side effects. You may report side effects to FDA at 1-800-FDA-1088. Where should I keep my medication? This medication is given in a hospital or clinic. It will not be stored at home. NOTE: This sheet is a summary. It may not cover all possible information. If you have questions about this medicine, talk to your doctor, pharmacist, or health care provider.  2024 Elsevier/Gold Standard (2022-04-06 00:00:00)

## 2023-06-13 ENCOUNTER — Other Ambulatory Visit: Payer: Self-pay | Admitting: *Deleted

## 2023-06-13 DIAGNOSIS — C9000 Multiple myeloma not having achieved remission: Secondary | ICD-10-CM

## 2023-06-20 ENCOUNTER — Other Ambulatory Visit: Payer: Self-pay | Admitting: Hematology & Oncology

## 2023-06-20 DIAGNOSIS — C9000 Multiple myeloma not having achieved remission: Secondary | ICD-10-CM

## 2023-07-01 ENCOUNTER — Encounter: Payer: Self-pay | Admitting: Hematology & Oncology

## 2023-07-08 ENCOUNTER — Inpatient Hospital Stay: Payer: Medicare Other | Admitting: Hematology & Oncology

## 2023-07-08 ENCOUNTER — Encounter: Payer: Self-pay | Admitting: Hematology & Oncology

## 2023-07-08 ENCOUNTER — Inpatient Hospital Stay: Payer: Medicare Other

## 2023-07-08 ENCOUNTER — Other Ambulatory Visit: Payer: Self-pay

## 2023-07-08 ENCOUNTER — Inpatient Hospital Stay: Payer: Medicare Other | Attending: Hematology & Oncology

## 2023-07-08 VITALS — BP 113/61 | HR 71 | Temp 98.7°F | Resp 18 | Ht 70.0 in | Wt 175.1 lb

## 2023-07-08 DIAGNOSIS — C9000 Multiple myeloma not having achieved remission: Secondary | ICD-10-CM | POA: Diagnosis present

## 2023-07-08 DIAGNOSIS — Z5112 Encounter for antineoplastic immunotherapy: Secondary | ICD-10-CM | POA: Diagnosis present

## 2023-07-08 DIAGNOSIS — Z79620 Long term (current) use of immunosuppressive biologic: Secondary | ICD-10-CM | POA: Insufficient documentation

## 2023-07-08 LAB — CBC WITH DIFFERENTIAL (CANCER CENTER ONLY)
Abs Immature Granulocytes: 0.01 10*3/uL (ref 0.00–0.07)
Basophils Absolute: 0 10*3/uL (ref 0.0–0.1)
Basophils Relative: 0 %
Eosinophils Absolute: 0.1 10*3/uL (ref 0.0–0.5)
Eosinophils Relative: 1 %
HCT: 40.2 % (ref 39.0–52.0)
Hemoglobin: 13.4 g/dL (ref 13.0–17.0)
Immature Granulocytes: 0 %
Lymphocytes Relative: 10 %
Lymphs Abs: 0.6 10*3/uL — ABNORMAL LOW (ref 0.7–4.0)
MCH: 31.8 pg (ref 26.0–34.0)
MCHC: 33.3 g/dL (ref 30.0–36.0)
MCV: 95.5 fL (ref 80.0–100.0)
Monocytes Absolute: 0.6 10*3/uL (ref 0.1–1.0)
Monocytes Relative: 9 %
Neutro Abs: 5.1 10*3/uL (ref 1.7–7.7)
Neutrophils Relative %: 80 %
Platelet Count: 153 10*3/uL (ref 150–400)
RBC: 4.21 MIL/uL — ABNORMAL LOW (ref 4.22–5.81)
RDW: 13.4 % (ref 11.5–15.5)
WBC Count: 6.4 10*3/uL (ref 4.0–10.5)
nRBC: 0 % (ref 0.0–0.2)

## 2023-07-08 LAB — CMP (CANCER CENTER ONLY)
ALT: 8 U/L (ref 0–44)
AST: 11 U/L — ABNORMAL LOW (ref 15–41)
Albumin: 4.6 g/dL (ref 3.5–5.0)
Alkaline Phosphatase: 45 U/L (ref 38–126)
Anion gap: 10 (ref 5–15)
BUN: 17 mg/dL (ref 8–23)
CO2: 24 mmol/L (ref 22–32)
Calcium: 9.3 mg/dL (ref 8.9–10.3)
Chloride: 106 mmol/L (ref 98–111)
Creatinine: 1.47 mg/dL — ABNORMAL HIGH (ref 0.61–1.24)
GFR, Estimated: 52 mL/min — ABNORMAL LOW (ref >60)
Glucose, Bld: 111 mg/dL — ABNORMAL HIGH (ref 70–99)
Potassium: 4.7 mmol/L (ref 3.5–5.1)
Sodium: 140 mmol/L (ref 135–145)
Total Bilirubin: 0.6 mg/dL (ref 0.3–1.2)
Total Protein: 6.6 g/dL (ref 6.5–8.1)

## 2023-07-08 LAB — MAGNESIUM: Magnesium: 2 mg/dL (ref 1.7–2.4)

## 2023-07-08 LAB — LACTATE DEHYDROGENASE: LDH: 101 U/L (ref 98–192)

## 2023-07-08 LAB — PHOSPHORUS: Phosphorus: 3.4 mg/dL (ref 2.5–4.6)

## 2023-07-08 MED ORDER — DARATUMUMAB-HYALURONIDASE-FIHJ 1800-30000 MG-UT/15ML ~~LOC~~ SOLN
1800.0000 mg | Freq: Once | SUBCUTANEOUS | Status: AC
  Administered 2023-07-08: 1800 mg via SUBCUTANEOUS
  Filled 2023-07-08: qty 15

## 2023-07-08 MED ORDER — DIPHENHYDRAMINE HCL 25 MG PO CAPS
50.0000 mg | ORAL_CAPSULE | Freq: Once | ORAL | Status: AC
  Administered 2023-07-08: 50 mg via ORAL
  Filled 2023-07-08: qty 2

## 2023-07-08 MED ORDER — BORTEZOMIB CHEMO SQ INJECTION 3.5 MG (2.5MG/ML)
1.3000 mg/m2 | Freq: Once | INTRAMUSCULAR | Status: AC
  Administered 2023-07-08: 2.75 mg via SUBCUTANEOUS
  Filled 2023-07-08: qty 1.1

## 2023-07-08 MED ORDER — ACETAMINOPHEN 325 MG PO TABS
650.0000 mg | ORAL_TABLET | Freq: Once | ORAL | Status: AC
  Administered 2023-07-08: 650 mg via ORAL
  Filled 2023-07-08: qty 2

## 2023-07-08 NOTE — Progress Notes (Signed)
Hematology and Oncology Follow Up Visit  Barry Gray [id number] Jun 22, 1956 67 y.o. 07/08/2023   Principle Diagnosis:  IgA Kappa myeloma -- normal cytogenetics/FISH (-)  Current Therapy:   S/p cervical spine stabilization -- 04/28/2022 Faspro/Velcade/Decadron -- s/p cycle #6-- start on 05/20/2022 Zometa 4 mg IV q 3 months -- next dose on 07/2023 Status post autologous stem cell transplant -- [facility] on 12/03/2022 Faspro/Velcade -- maintenance -  start on 04/28/2023 -Velcade discontinued after 07/08/2023     Interim History:  Barry Gray is back for follow-up.  He is doing quite well.  He is quite active.  He wants to ride his bike.  Hopefully, he will be able to do this this weekend.  Overall, he seems to be managing pretty well.  When we last saw him, his monoclonal spike was 0.1 g/dL.  His IgA level was 20 mg/dL.  The Kappa light chain was 0.3 mg/dL.  He has had no nausea or vomiting.  He has had no change in bowel or bladder habits.  There has been no leg swelling.  He has had no rashes.  He has had no cough or shortness of breath.  He has had no headache.  He recovered well from his neck surgery.  Overall, I would say his performance status is probably ECOG 1.       Medications:  Current Outpatient Medications:    acyclovir (ZOVIRAX) 400 MG tablet, Take 400 mg by mouth 2 (two) times daily., Disp: , Rfl:    lactulose (CHRONULAC) 10 GM/15ML solution, TAKE 15 MLS (10 G TOTAL) BY MOUTH 3 (THREE) TIMES DAILY., Disp: 946 mL, Rfl: 2   metFORMIN (GLUCOPHAGE) 500 MG tablet, Take 500 mg by mouth 2 (two) times daily with a meal., Disp: , Rfl:    tamsulosin (FLOMAX) 0.4 MG CAPS capsule, Take 0.4 mg by mouth daily., Disp: , Rfl:    glipiZIDE (GLUCOTROL) 5 MG tablet, Take 5 mg by mouth 2 (two) times daily before a meal. (Patient not taking: Reported on 07/08/2023), Disp: , Rfl:    levothyroxine (SYNTHROID) 50 MCG tablet, Take 50 mcg by mouth daily before breakfast. (Patient not taking:  Reported on 04/22/2023), Disp: , Rfl:   Allergies:  Allergies  Allergen Reactions   Revlimid [Lenalidomide] Rash    Past Medical History, Surgical history, Social history, and Family History were reviewed and updated.  Review of Systems: Review of Systems  Constitutional: Negative.   HENT:  Negative.    Eyes: Negative.   Respiratory: Negative.    Cardiovascular: Negative.   Gastrointestinal: Negative.   Endocrine: Negative.   Genitourinary:  Positive for difficulty urinating. Negative for bladder incontinence.   Musculoskeletal:  Positive for neck pain.  Skin: Negative.   Neurological: Negative.   Hematological: Negative.   Psychiatric/Behavioral: Negative.      Physical Exam:  height is 5\' 10"  (1.778 m) and weight is 175 lb 1.9 oz (79.4 kg). His oral temperature is 98.7 F (37.1 C). His blood pressure is 113/61 and his pulse is 71. His respiration is 18 and oxygen saturation is 100%.   Wt Readings from Last 3 Encounters:  07/08/23 175 lb 1.9 oz (79.4 kg)  06/10/23 173 lb (78.5 kg)  05/20/23 176 lb (79.8 kg)    Physical Exam Vitals reviewed.  HENT:     Head: Normocephalic and atraumatic.  Eyes:     Pupils: Pupils are equal, round, and reactive to light.  Neck:     Comments: His neck exam does  show the healing cervical scar on the back of the neck.  This is a little bit firm.  He has no obvious adenopathy in the neck.  Cardiovascular:     Rate and Rhythm: Normal rate and regular rhythm.     Heart sounds: Normal heart sounds.  Pulmonary:     Effort: Pulmonary effort is normal.     Breath sounds: Normal breath sounds.  Abdominal:     General: Bowel sounds are normal.     Palpations: Abdomen is soft.  Musculoskeletal:        General: No tenderness or deformity. Normal range of motion.     Cervical back: Normal range of motion.  Lymphadenopathy:     Cervical: No cervical adenopathy.  Skin:    General: Skin is warm and dry.     Findings: No erythema or rash.   Neurological:     Mental Status: He is alert and oriented to person, place, and time.  Psychiatric:        Behavior: Behavior normal.        Thought Content: Thought content normal.        Judgment: Judgment normal.      Lab Results  Component Value Date   WBC 6.4 07/08/2023   HGB 13.4 07/08/2023   HCT 40.2 07/08/2023   MCV 95.5 07/08/2023   PLT 153 07/08/2023     Chemistry      Component Value Date/Time   NA 140 07/08/2023 0949   K 4.7 07/08/2023 0949   CL 106 07/08/2023 0949   CO2 24 07/08/2023 0949   BUN 17 07/08/2023 0949   CREATININE 1.47 (H) 07/08/2023 0949      Component Value Date/Time   CALCIUM 9.3 07/08/2023 0949   ALKPHOS 45 07/08/2023 0949   AST 11 (L) 07/08/2023 0949   ALT 8 07/08/2023 0949   BILITOT 0.6 07/08/2023 0949      Impression and Plan: [name] is a very nice 67 year old white male.  He has IgA kappa myeloma.  Most of his myeloma is Kappa light chain.  Again, his light chains have come down incredibly well with chemotherapy.  We then got him to transplant.  This will be his last day of Faspro and Velcade.  After today, we will just go with Faspro monthly.  I am just happy that he is done so well.  I do not see that we have to do any scans on him.  I do not see that we have to do a bone marrow test either.  We will get him back in 1 month.  When he comes back, he gets his Zometa.    Barry Macho, MD 8/23/202411:13 AM

## 2023-07-08 NOTE — Patient Instructions (Signed)
[facility]  Discharge Instructions: Thank you for choosing [facility] to provide your oncology and hematology care.   If you have a lab appointment with the Cancer Center, please go directly to the Cancer Center and check in at the registration area.  Wear comfortable clothing and clothing appropriate for easy access to any Portacath or PICC line.   We strive to give you quality time with your provider. You may need to reschedule your appointment if you arrive late (15 or more minutes).  Arriving late affects you and other patients whose appointments are after yours.  Also, if you miss three or more appointments without notifying the office, you may be dismissed from the clinic at the provider's discretion.      For prescription refill requests, have your pharmacy contact our office and allow 72 hours for refills to be completed.    Today you received the following chemotherapy and/or immunotherapy agents Velcade, Darzalex.      To help prevent nausea and vomiting after your treatment, we encourage you to take your nausea medication as directed.  BELOW ARE SYMPTOMS THAT SHOULD BE REPORTED IMMEDIATELY: *FEVER GREATER THAN 100.4 F (38 C) OR HIGHER *CHILLS OR SWEATING *NAUSEA AND VOMITING THAT IS NOT CONTROLLED WITH YOUR NAUSEA MEDICATION *UNUSUAL SHORTNESS OF BREATH *UNUSUAL BRUISING OR BLEEDING *URINARY PROBLEMS (pain or burning when urinating, or frequent urination) *BOWEL PROBLEMS (unusual diarrhea, constipation, pain near the anus) TENDERNESS IN MOUTH AND THROAT WITH OR WITHOUT PRESENCE OF ULCERS (sore throat, sores in mouth, or a toothache) UNUSUAL RASH, SWELLING OR PAIN  UNUSUAL VAGINAL DISCHARGE OR ITCHING   Items with * indicate a potential emergency and should be followed up as soon as possible or go to the Emergency Department if any problems should occur.  Please show the CHEMOTHERAPY ALERT CARD or IMMUNOTHERAPY ALERT CARD  at check-in to the Emergency Department and triage nurse. Should you have questions after your visit or need to cancel or reschedule your appointment, please contact [facility]  [phone] and follow the prompts.  Office hours are 8:00 a.m. to 4:30 p.m. Monday - Friday. Please note that voicemails left after 4:00 p.m. may not be returned until the following business day.  We are closed weekends and major holidays. You have access to a nurse at all times for urgent questions. Please call the main number to the clinic [phone] and follow the prompts.  For any non-urgent questions, you may also contact your provider using MyChart. We now offer e-Visits for anyone 18 and older to request care online for non-urgent symptoms. For details visit [email or web address].   Also download the MyChart app! Go to the app store, search "MyChart", open the app, select , and log in with your MyChart username and password.   

## 2023-07-10 LAB — IGG, IGA, IGM
IgA: 19 mg/dL — ABNORMAL LOW (ref 61–437)
IgG (Immunoglobin G), Serum: 248 mg/dL — ABNORMAL LOW (ref 603–1613)
IgM (Immunoglobulin M), Srm: <5 mg/dL — ABNORMAL LOW (ref 20–172)

## 2023-07-11 LAB — KAPPA/LAMBDA LIGHT CHAINS
Kappa free light chain: 4.8 mg/L (ref 3.3–19.4)
Kappa, lambda light chain ratio: 1.55 (ref 0.26–1.65)
Lambda free light chains: 3.1 mg/L — ABNORMAL LOW (ref 5.7–26.3)

## 2023-07-12 LAB — PROTEIN ELECTROPHORESIS, SERUM, WITH REFLEX
A/G Ratio: 1.7 (ref 0.7–1.7)
Albumin ELP: 3.8 g/dL (ref 2.9–4.4)
Alpha-1-Globulin: 0.3 g/dL (ref 0.0–0.4)
Alpha-2-Globulin: 1 g/dL (ref 0.4–1.0)
Beta Globulin: 0.9 g/dL (ref 0.7–1.3)
Gamma Globulin: 0.2 g/dL — ABNORMAL LOW (ref 0.4–1.8)
Globulin, Total: 2.3 g/dL (ref 2.2–3.9)
Total Protein ELP: 6.1 g/dL (ref 6.0–8.5)

## 2023-07-16 ENCOUNTER — Other Ambulatory Visit: Payer: Self-pay | Admitting: Hematology & Oncology

## 2023-07-17 ENCOUNTER — Encounter: Payer: Self-pay | Admitting: Hematology & Oncology

## 2023-07-22 ENCOUNTER — Inpatient Hospital Stay: Payer: Medicare Other

## 2023-07-22 ENCOUNTER — Inpatient Hospital Stay: Payer: Medicare Other | Admitting: Hematology & Oncology

## 2023-08-05 ENCOUNTER — Encounter: Payer: Self-pay | Admitting: Family

## 2023-08-05 ENCOUNTER — Inpatient Hospital Stay: Payer: Medicare Other | Attending: Hematology & Oncology

## 2023-08-05 ENCOUNTER — Other Ambulatory Visit: Payer: Self-pay

## 2023-08-05 ENCOUNTER — Inpatient Hospital Stay (HOSPITAL_BASED_OUTPATIENT_CLINIC_OR_DEPARTMENT_OTHER): Payer: Medicare Other | Admitting: Family

## 2023-08-05 ENCOUNTER — Inpatient Hospital Stay: Payer: Medicare Other

## 2023-08-05 VITALS — BP 144/71 | HR 58 | Resp 18

## 2023-08-05 VITALS — BP 136/64 | HR 63 | Temp 98.4°F | Resp 18 | Ht 70.0 in | Wt 183.8 lb

## 2023-08-05 DIAGNOSIS — C9000 Multiple myeloma not having achieved remission: Secondary | ICD-10-CM

## 2023-08-05 DIAGNOSIS — Z5112 Encounter for antineoplastic immunotherapy: Secondary | ICD-10-CM | POA: Diagnosis present

## 2023-08-05 LAB — CMP (CANCER CENTER ONLY)
ALT: 8 U/L (ref 0–44)
AST: 10 U/L — ABNORMAL LOW (ref 15–41)
Albumin: 4.3 g/dL (ref 3.5–5.0)
Alkaline Phosphatase: 44 U/L (ref 38–126)
Anion gap: 10 (ref 5–15)
BUN: 14 mg/dL (ref 8–23)
CO2: 26 mmol/L (ref 22–32)
Calcium: 8.9 mg/dL (ref 8.9–10.3)
Chloride: 104 mmol/L (ref 98–111)
Creatinine: 1.41 mg/dL — ABNORMAL HIGH (ref 0.61–1.24)
GFR, Estimated: 55 mL/min — ABNORMAL LOW (ref >60)
Glucose, Bld: 106 mg/dL — ABNORMAL HIGH (ref 70–99)
Potassium: 4.8 mmol/L (ref 3.5–5.1)
Sodium: 140 mmol/L (ref 135–145)
Total Bilirubin: 0.6 mg/dL (ref 0.3–1.2)
Total Protein: 6.3 g/dL — ABNORMAL LOW (ref 6.5–8.1)

## 2023-08-05 LAB — CBC WITH DIFFERENTIAL (CANCER CENTER ONLY)
Abs Immature Granulocytes: 0.02 10*3/uL (ref 0.00–0.07)
Basophils Absolute: 0 10*3/uL (ref 0.0–0.1)
Basophils Relative: 0 %
Eosinophils Absolute: 0.1 10*3/uL (ref 0.0–0.5)
Eosinophils Relative: 1 %
HCT: 39.3 % (ref 39.0–52.0)
Hemoglobin: 13.2 g/dL (ref 13.0–17.0)
Immature Granulocytes: 0 %
Lymphocytes Relative: 13 %
Lymphs Abs: 0.8 10*3/uL (ref 0.7–4.0)
MCH: 31.6 pg (ref 26.0–34.0)
MCHC: 33.6 g/dL (ref 30.0–36.0)
MCV: 94 fL (ref 80.0–100.0)
Monocytes Absolute: 0.7 10*3/uL (ref 0.1–1.0)
Monocytes Relative: 11 %
Neutro Abs: 4.5 10*3/uL (ref 1.7–7.7)
Neutrophils Relative %: 75 %
Platelet Count: 150 10*3/uL (ref 150–400)
RBC: 4.18 MIL/uL — ABNORMAL LOW (ref 4.22–5.81)
RDW: 13.2 % (ref 11.5–15.5)
WBC Count: 6 10*3/uL (ref 4.0–10.5)
nRBC: 0 % (ref 0.0–0.2)

## 2023-08-05 LAB — LACTATE DEHYDROGENASE: LDH: 95 U/L — ABNORMAL LOW (ref 98–192)

## 2023-08-05 MED ORDER — ACETAMINOPHEN 325 MG PO TABS
650.0000 mg | ORAL_TABLET | Freq: Once | ORAL | Status: AC
  Administered 2023-08-05: 650 mg via ORAL
  Filled 2023-08-05: qty 2

## 2023-08-05 MED ORDER — DARATUMUMAB-HYALURONIDASE-FIHJ 1800-30000 MG-UT/15ML ~~LOC~~ SOLN
1800.0000 mg | Freq: Once | SUBCUTANEOUS | Status: AC
  Administered 2023-08-05: 1800 mg via SUBCUTANEOUS
  Filled 2023-08-05: qty 15

## 2023-08-05 MED ORDER — DIPHENHYDRAMINE HCL 25 MG PO CAPS
50.0000 mg | ORAL_CAPSULE | Freq: Once | ORAL | Status: AC
  Administered 2023-08-05: 50 mg via ORAL
  Filled 2023-08-05: qty 2

## 2023-08-05 MED ORDER — SODIUM CHLORIDE 0.9 % IV SOLN: Freq: Once | INTRAVENOUS | Status: AC

## 2023-08-05 MED ORDER — ZOLEDRONIC ACID 4 MG/100ML IV SOLN
4.0000 mg | Freq: Once | INTRAVENOUS | Status: AC
  Administered 2023-08-05: 4 mg via INTRAVENOUS
  Filled 2023-08-05: qty 100

## 2023-08-05 NOTE — Progress Notes (Signed)
Hematology and Oncology Follow Up Visit  Barry Gray [id number] 05-25-1956 67 y.o. 08/05/2023   Principle Diagnosis:  IgA Kappa myeloma -- normal cytogenetics/FISH (-)   Current Therapy:        S/p cervical spine stabilization -- 04/28/2022 Faspro/Velcade/Decadron -- s/p cycle #6-- start on 05/20/2022 Zometa 4 mg IV q 3 months -- next dose on 10/2023 Status post autologous stem cell transplant -- [facility] on 12/03/2022 Faspro/Velcade -- maintenance -  start on 04/28/2023 - Velcade discontinued after 07/08/2023   Interim History:  Barry Gray is here today with his wife for follow-up and Faspro treatment. He is doing fairly well. He states that after his last treatment in August, he experienced tremendous neck and back pain for about 10 days. He did go back to see his surgeon and states that work up was negative.  I spoke with Barry Gray and this is not a typical presentation with Velcade and Faspro. There had been no dosage change.  Last month no M-spike observed, IgA level was 19 mg/dL and kappa light chains 3.1 mg/L. Pain has since resolved. No new issues at this time.  He has completed treatment with Velcade and will get Faspro only today.  No fever, chills, n/v, cough, rash, dizziness, SOB, chest pain, palpitations, abdominal pain or changes in bowel or bladder habits.  He takes Lactulose to prevent constipation which can cause gas and cramping.  No blood loss noted. No abnormal bruising, no petechiae.  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  No falls or syncope reported.  Appetite and hydration are good. Weight is 183 lbs.   ECOG Performance Status: 1 - Symptomatic but completely ambulatory  Medications:  Allergies as of 08/05/2023       Reactions   Revlimid [lenalidomide] Rash        Medication List        Accurate as of August 05, 2023 11:00 AM. If you have any questions, ask your nurse or doctor.          STOP taking these medications     glipiZIDE 5 MG tablet Commonly known as: GLUCOTROL Stopped by: Eileen Stanford       TAKE these medications    acyclovir 400 MG tablet Commonly known as: ZOVIRAX Take 400 mg by mouth 2 (two) times daily.   lactulose 10 GM/15ML solution Commonly known as: CHRONULAC TAKE 15 MLS (10 G TOTAL) BY MOUTH 3 (THREE) TIMES DAILY.   levothyroxine 50 MCG tablet Commonly known as: SYNTHROID Take 50 mcg by mouth daily before breakfast.   metFORMIN 500 MG tablet Commonly known as: GLUCOPHAGE Take 500 mg by mouth 2 (two) times daily with a meal.   tamsulosin 0.4 MG Caps capsule Commonly known as: FLOMAX Take 0.4 mg by mouth daily.        Allergies:  Allergies  Allergen Reactions   Revlimid [Lenalidomide] Rash    Past Medical History, Surgical history, Social history, and Family History were reviewed and updated.  Review of Systems: All other 10 point review of systems is negative.   Physical Exam:  height is 5\' 10"  (1.778 m) and weight is 183 lb 12.8 oz (83.4 kg). His oral temperature is 98.4 F (36.9 C). His blood pressure is 136/64 and his pulse is 63. His respiration is 18 and oxygen saturation is 100%.   Wt Readings from Last 3 Encounters:  08/05/23 183 lb 12.8 oz (83.4 kg)  07/08/23 175 lb 1.9 oz (79.4 kg)  06/10/23 173 lb (  78.5 kg)    Ocular: Sclerae unicteric, pupils equal, round and reactive to light Ear-nose-throat: Oropharynx clear, dentition fair Lymphatic: No cervical or supraclavicular adenopathy Lungs no rales or rhonchi, good excursion bilaterally Heart regular rate and rhythm, no murmur appreciated Abd soft, nontender, positive bowel sounds MSK no focal spinal tenderness, no joint edema Neuro: non-focal, well-oriented, appropriate affect Breasts: Deferred   Lab Results  Component Value Date   WBC 6.0 08/05/2023   HGB 13.2 08/05/2023   HCT 39.3 08/05/2023   MCV 94.0 08/05/2023   PLT 150 08/05/2023   No results found for: "FERRITIN", "IRON",  "TIBC", "UIBC", "IRONPCTSAT" Lab Results  Component Value Date   RBC 4.18 (L) 08/05/2023   Lab Results  Component Value Date   KPAFRELGTCHN 4.8 07/08/2023   LAMBDASER 3.1 (L) 07/08/2023   KAPLAMBRATIO 1.55 07/08/2023   Lab Results  Component Value Date   IGGSERUM 248 (L) 07/08/2023   IGA 19 (L) 07/08/2023   IGMSERUM <5 (L) 07/08/2023   Lab Results  Component Value Date   TOTALPROTELP 6.1 07/08/2023   ALBUMINELP 3.8 07/08/2023   A1GS 0.3 07/08/2023   A2GS 1.0 07/08/2023   BETS 0.9 07/08/2023   GAMS 0.2 (L) 07/08/2023   MSPIKE Not Observed 07/08/2023   SPEI Comment 10/14/2022     Chemistry      Component Value Date/Time   NA 140 08/05/2023 1010   K 4.8 08/05/2023 1010   CL 104 08/05/2023 1010   CO2 26 08/05/2023 1010   BUN 14 08/05/2023 1010   CREATININE 1.41 (H) 08/05/2023 1010      Component Value Date/Time   CALCIUM 8.9 08/05/2023 1010   ALKPHOS 44 08/05/2023 1010   AST 10 (L) 08/05/2023 1010   ALT 8 08/05/2023 1010   BILITOT 0.6 08/05/2023 1010       Impression and Plan: Barry Gray is a pleasant 67 yo caucasian gentleman with IgA kappa myeloma. He completed chemo and went for transplant (11/2022).  So far he continues to tolerate treatment nicely now on single dose Faspro since completing Velcade.  We will proceed with treatment today as planned along with Zometa.  Protein studies pending.  Follow-up in 1 month.   Eileen Stanford, NP 9/20/202411:00 AM

## 2023-08-05 NOTE — Patient Instructions (Signed)
[facility]  Discharge Instructions: Thank you for choosing [facility] to provide your oncology and hematology care.   If you have a lab appointment with the Cancer Center, please go directly to the Cancer Center and check in at the registration area.  Wear comfortable clothing and clothing appropriate for easy access to any Portacath or PICC line.   We strive to give you quality time with your provider. You may need to reschedule your appointment if you arrive late (15 or more minutes).  Arriving late affects you and other patients whose appointments are after yours.  Also, if you miss three or more appointments without notifying the office, you may be dismissed from the clinic at the provider's discretion.      For prescription refill requests, have your pharmacy contact our office and allow 72 hours for refills to be completed.    Today you received the following chemotherapy and/or immunotherapy agents Darzalex - Faspro.      To help prevent nausea and vomiting after your treatment, we encourage you to take your nausea medication as directed.  BELOW ARE SYMPTOMS THAT SHOULD BE REPORTED IMMEDIATELY: *FEVER GREATER THAN 100.4 F (38 C) OR HIGHER *CHILLS OR SWEATING *NAUSEA AND VOMITING THAT IS NOT CONTROLLED WITH YOUR NAUSEA MEDICATION *UNUSUAL SHORTNESS OF BREATH *UNUSUAL BRUISING OR BLEEDING *URINARY PROBLEMS (pain or burning when urinating, or frequent urination) *BOWEL PROBLEMS (unusual diarrhea, constipation, pain near the anus) TENDERNESS IN MOUTH AND THROAT WITH OR WITHOUT PRESENCE OF ULCERS (sore throat, sores in mouth, or a toothache) UNUSUAL RASH, SWELLING OR PAIN  UNUSUAL VAGINAL DISCHARGE OR ITCHING   Items with * indicate a potential emergency and should be followed up as soon as possible or go to the Emergency Department if any problems should occur.  Please show the CHEMOTHERAPY ALERT CARD or IMMUNOTHERAPY ALERT CARD  at check-in to the Emergency Department and triage nurse. Should you have questions after your visit or need to cancel or reschedule your appointment, please contact [facility] [place]  [phone] and follow the prompts.  Office hours are 8:00 a.m. to 4:30 p.m. Monday - Friday. Please note that voicemails left after 4:00 p.m. may not be returned until the following business day.  We are closed weekends and major holidays. You have access to a nurse at all times for urgent questions. Please call the main number to the clinic [phone] and follow the prompts.  For any non-urgent questions, you may also contact your provider using MyChart. We now offer e-Visits for anyone 48 and older to request care online for non-urgent symptoms. For details visit [email or web address].   Also download the MyChart app! Go to the app store, search "MyChart", open the app, select [facility], and log in with your MyChart username and password.  Zoledronic Acid Injection (Cancer) What is this medication? ZOLEDRONIC ACID (ZOE le dron ik AS id) treats high calcium levels in the blood caused by cancer. It may also be used with chemotherapy to treat weakened bones caused by cancer. It works by slowing down the release of calcium from bones. This lowers calcium levels in your blood. It also makes your bones stronger and less likely to break (fracture). It belongs to a group of medications called bisphosphonates. This medicine may be used for other purposes; ask your health care provider or pharmacist if you have questions. COMMON BRAND NAME(S): Zometa, Zometa Powder What should I tell my care  team before I take this medication? They need to know if you have any of these conditions: Dehydration Dental disease Kidney disease Liver disease Low levels of calcium in the blood Lung or breathing disease, such as asthma Receiving steroids, such as dexamethasone or prednisone An unusual or  allergic reaction to zoledronic acid, other medications, foods, dyes, or preservatives Pregnant or trying to get pregnant Breast-feeding How should I use this medication? This medication is injected into a vein. It is given by your care team in a hospital or clinic setting. Talk to your care team about the use of this medication in children. Special care may be needed. Overdosage: If you think you have taken too much of this medicine contact a poison control center or emergency room at once. NOTE: This medicine is only for you. Do not share this medicine with others. What if I miss a dose? Keep appointments for follow-up doses. It is important not to miss your dose. Call your care team if you are unable to keep an appointment. What may interact with this medication? Certain antibiotics given by injection Diuretics, such as bumetanide, furosemide NSAIDs, medications for pain and inflammation, such as ibuprofen or naproxen Teriparatide Thalidomide This list may not describe all possible interactions. Give your health care provider a list of all the medicines, herbs, non-prescription drugs, or dietary supplements you use. Also tell them if you smoke, drink alcohol, or use illegal drugs. Some items may interact with your medicine. What should I watch for while using this medication? Visit your care team for regular checks on your progress. It may be some time before you see the benefit from this medication. Some people who take this medication have severe bone, joint, or muscle pain. This medication may also increase your risk for jaw problems or a broken thigh bone. Tell your care team right away if you have severe pain in your jaw, bones, joints, or muscles. Tell you care team if you have any pain that does not go away or that gets worse. Tell your dentist and dental surgeon that you are taking this medication. You should not have major dental surgery while on this medication. See your dentist to  have a dental exam and fix any dental problems before starting this medication. Take good care of your teeth while on this medication. Make sure you see your dentist for regular follow-up appointments. You should make sure you get enough calcium and vitamin D while you are taking this medication. Discuss the foods you eat and the vitamins you take with your care team. Check with your care team if you have severe diarrhea, nausea, and vomiting, or if you sweat a lot. The loss of too much body fluid may make it dangerous for you to take this medication. You may need bloodwork while taking this medication. Talk to your care team if you wish to become pregnant or think you might be pregnant. This medication can cause serious birth defects. What side effects may I notice from receiving this medication? Side effects that you should report to your care team as soon as possible: Allergic reactions--skin rash, itching, hives, swelling of the face, lips, tongue, or throat Kidney injury--decrease in the amount of urine, swelling of the ankles, hands, or feet Low calcium level--muscle pain or cramps, confusion, tingling, or numbness in the hands or feet Osteonecrosis of the jaw--pain, swelling, or redness in the mouth, numbness of the jaw, poor healing after dental work, unusual discharge from the mouth, visible  bones in the mouth Severe bone, joint, or muscle pain Side effects that usually do not require medical attention (report to your care team if they continue or are bothersome): Constipation Fatigue Fever Loss of appetite Nausea Stomach pain This list may not describe all possible side effects. Call your doctor for medical advice about side effects. You may report side effects to FDA at 1-800-FDA-1088. Where should I keep my medication? This medication is given in a hospital or clinic. It will not be stored at home. NOTE: This sheet is a summary. It may not cover all possible information. If you have  questions about this medicine, talk to your doctor, pharmacist, or health care provider.  2024 Elsevier/Gold Standard (2021-12-25 00:00:00)

## 2023-08-06 ENCOUNTER — Other Ambulatory Visit: Payer: Self-pay

## 2023-08-07 LAB — IGG, IGA, IGM
IgA: 23 mg/dL — ABNORMAL LOW (ref 61–437)
IgG (Immunoglobin G), Serum: 244 mg/dL — ABNORMAL LOW (ref 603–1613)
IgM (Immunoglobulin M), Srm: <5 mg/dL — ABNORMAL LOW (ref 20–172)

## 2023-08-08 LAB — KAPPA/LAMBDA LIGHT CHAINS
Kappa free light chain: 8.7 mg/L (ref 3.3–19.4)
Kappa, lambda light chain ratio: 3 — ABNORMAL HIGH (ref 0.26–1.65)
Lambda free light chains: 2.9 mg/L — ABNORMAL LOW (ref 5.7–26.3)

## 2023-08-09 LAB — PROTEIN ELECTROPHORESIS, SERUM, WITH REFLEX
A/G Ratio: 1.5 (ref 0.7–1.7)
Albumin ELP: 3.6 g/dL (ref 2.9–4.4)
Alpha-1-Globulin: 0.3 g/dL (ref 0.0–0.4)
Alpha-2-Globulin: 1 g/dL (ref 0.4–1.0)
Beta Globulin: 0.9 g/dL (ref 0.7–1.3)
Gamma Globulin: 0.2 g/dL — ABNORMAL LOW (ref 0.4–1.8)
Globulin, Total: 2.4 g/dL (ref 2.2–3.9)
Total Protein ELP: 6 g/dL (ref 6.0–8.5)

## 2023-08-11 ENCOUNTER — Other Ambulatory Visit: Payer: Self-pay

## 2023-08-16 ENCOUNTER — Other Ambulatory Visit: Payer: Self-pay | Admitting: Hematology & Oncology

## 2023-08-16 DIAGNOSIS — C9000 Multiple myeloma not having achieved remission: Secondary | ICD-10-CM

## 2023-08-25 ENCOUNTER — Other Ambulatory Visit: Payer: Self-pay

## 2023-09-05 ENCOUNTER — Inpatient Hospital Stay: Payer: Medicare Other | Attending: Hematology & Oncology

## 2023-09-05 ENCOUNTER — Inpatient Hospital Stay (HOSPITAL_BASED_OUTPATIENT_CLINIC_OR_DEPARTMENT_OTHER): Payer: Medicare Other | Admitting: Medical Oncology

## 2023-09-05 ENCOUNTER — Encounter: Payer: Self-pay | Admitting: Medical Oncology

## 2023-09-05 ENCOUNTER — Inpatient Hospital Stay: Payer: Medicare Other

## 2023-09-05 DIAGNOSIS — Z79620 Long term (current) use of immunosuppressive biologic: Secondary | ICD-10-CM | POA: Diagnosis not present

## 2023-09-05 DIAGNOSIS — Z5112 Encounter for antineoplastic immunotherapy: Secondary | ICD-10-CM | POA: Diagnosis present

## 2023-09-05 DIAGNOSIS — C9000 Multiple myeloma not having achieved remission: Secondary | ICD-10-CM

## 2023-09-05 LAB — CMP (CANCER CENTER ONLY)
ALT: 10 U/L (ref 0–44)
AST: 12 U/L — ABNORMAL LOW (ref 15–41)
Albumin: 4.8 g/dL (ref 3.5–5.0)
Alkaline Phosphatase: 47 U/L (ref 38–126)
Anion gap: 11 (ref 5–15)
BUN: 14 mg/dL (ref 8–23)
CO2: 24 mmol/L (ref 22–32)
Calcium: 9.4 mg/dL (ref 8.9–10.3)
Chloride: 106 mmol/L (ref 98–111)
Creatinine: 1.44 mg/dL — ABNORMAL HIGH (ref 0.61–1.24)
GFR, Estimated: 53 mL/min — ABNORMAL LOW (ref >60)
Glucose, Bld: 101 mg/dL — ABNORMAL HIGH (ref 70–99)
Potassium: 4.9 mmol/L (ref 3.5–5.1)
Sodium: 141 mmol/L (ref 135–145)
Total Bilirubin: 0.7 mg/dL (ref 0.3–1.2)
Total Protein: 6.5 g/dL (ref 6.5–8.1)

## 2023-09-05 LAB — LACTATE DEHYDROGENASE: LDH: 102 U/L (ref 98–192)

## 2023-09-05 LAB — CBC WITH DIFFERENTIAL (CANCER CENTER ONLY)
Abs Immature Granulocytes: 0.03 10*3/uL (ref 0.00–0.07)
Basophils Absolute: 0 10*3/uL (ref 0.0–0.1)
Basophils Relative: 1 %
Eosinophils Absolute: 0.1 10*3/uL (ref 0.0–0.5)
Eosinophils Relative: 2 %
HCT: 40 % (ref 39.0–52.0)
Hemoglobin: 13.5 g/dL (ref 13.0–17.0)
Immature Granulocytes: 1 %
Lymphocytes Relative: 16 %
Lymphs Abs: 1.1 10*3/uL (ref 0.7–4.0)
MCH: 31.4 pg (ref 26.0–34.0)
MCHC: 33.8 g/dL (ref 30.0–36.0)
MCV: 93 fL (ref 80.0–100.0)
Monocytes Absolute: 0.5 10*3/uL (ref 0.1–1.0)
Monocytes Relative: 8 %
Neutro Abs: 4.8 10*3/uL (ref 1.7–7.7)
Neutrophils Relative %: 72 %
Platelet Count: 176 10*3/uL (ref 150–400)
RBC: 4.3 MIL/uL (ref 4.22–5.81)
RDW: 13.2 % (ref 11.5–15.5)
WBC Count: 6.6 10*3/uL (ref 4.0–10.5)
nRBC: 0 % (ref 0.0–0.2)

## 2023-09-05 MED ORDER — ACETAMINOPHEN 325 MG PO TABS
650.0000 mg | ORAL_TABLET | Freq: Once | ORAL | Status: AC
  Administered 2023-09-05: 650 mg via ORAL
  Filled 2023-09-05: qty 2

## 2023-09-05 MED ORDER — DARATUMUMAB-HYALURONIDASE-FIHJ 1800-30000 MG-UT/15ML ~~LOC~~ SOLN
1800.0000 mg | Freq: Once | SUBCUTANEOUS | Status: AC
  Administered 2023-09-05: 1800 mg via SUBCUTANEOUS
  Filled 2023-09-05: qty 15

## 2023-09-05 MED ORDER — DIPHENHYDRAMINE HCL 25 MG PO CAPS
50.0000 mg | ORAL_CAPSULE | Freq: Once | ORAL | Status: AC
  Administered 2023-09-05: 50 mg via ORAL
  Filled 2023-09-05: qty 2

## 2023-09-05 NOTE — Progress Notes (Signed)
Hematology and Oncology Follow Up Visit  Barry Gray [id number] [birth year] 67 y.o. 09/05/2023   Principle Diagnosis:  IgA Kappa myeloma -- normal cytogenetics/FISH (-)   Current Therapy:        S/p cervical spine stabilization -- 04/28/2022 Faspro/Velcade/Decadron -- s/p cycle #6-- start on 05/20/2022 Zometa 4 mg IV q 3 months -- next dose on 10/2023 Status post autologous stem cell transplant -- [facility] on 12/03/2022 Faspro/Velcade -- maintenance -  start on 04/28/2023 - Velcade discontinued after 07/08/2023   Interim History:  Barry Gray is here today with his wife for follow-up and consideration of Faspro treatment.   He has been doing well.  He is followed closely by his dentist. Having some filling a and one root canal done soon.   Last month no M-spike observed, IgA level was 23 mg/dL and kappa light chains 3.0 mg/L. Pain has since resolved. No new issues at this time.  He has completed treatment with Velcade and will get Faspro only today.  No fever, chills, n/v, cough, rash, dizziness, SOB, chest pain, palpitations, abdominal pain or changes in bowel or bladder habits.  He takes Lactulose to prevent constipation which can cause gas and cramping.  No blood loss noted. No abnormal bruising, no petechiae.  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  No falls or syncope reported.  Appetite and hydration are good.  Wt Readings from Last 3 Encounters:  09/05/23 178 lb 0.6 oz (80.8 kg)  08/05/23 183 lb 12.8 oz (83.4 kg)  07/08/23 175 lb 1.9 oz (79.4 kg)     ECOG Performance Status: 1 - Symptomatic but completely ambulatory  Medications:  Allergies as of 09/05/2023       Reactions   Revlimid [lenalidomide] Rash        Medication List        Accurate as of September 05, 2023 12:56 PM. If you have any questions, ask your nurse or doctor.          acyclovir 400 MG tablet Commonly known as: ZOVIRAX Take 400 mg by mouth 2 (two) times daily.   lactulose  10 GM/15ML solution Commonly known as: CHRONULAC TAKE 15 MLS (10 G TOTAL) BY MOUTH 3 (THREE) TIMES DAILY. What changed:  when to take this reasons to take this   levothyroxine 50 MCG tablet Commonly known as: SYNTHROID Take 50 mcg by mouth daily before breakfast.   metFORMIN 500 MG tablet Commonly known as: GLUCOPHAGE Take 500 mg by mouth 2 (two) times daily with a meal.   tamsulosin 0.4 MG Caps capsule Commonly known as: FLOMAX Take 0.4 mg by mouth daily.        Allergies:  Allergies  Allergen Reactions   Revlimid [Lenalidomide] Rash    Past Medical History, Surgical history, Social history, and Family History were reviewed and updated.  Review of Systems: All other 10 point review of systems is negative.   Physical Exam:  height is 5\' 10"  (1.778 m) and weight is 178 lb 0.6 oz (80.8 kg). His oral temperature is 97.7 F (36.5 C). His blood pressure is 127/65 and his pulse is 63. His respiration is 20 and oxygen saturation is 100%.   Wt Readings from Last 3 Encounters:  09/05/23 178 lb 0.6 oz (80.8 kg)  08/05/23 183 lb 12.8 oz (83.4 kg)  07/08/23 175 lb 1.9 oz (79.4 kg)    Ocular: Sclerae unicteric, pupils equal, round and reactive to light Ear-nose-throat: Oropharynx clear, dentition fair Lymphatic: No cervical or  supraclavicular adenopathy Lungs no rales or rhonchi, good excursion bilaterally Heart regular rate and rhythm, no murmur appreciated Abd soft, nontender, positive bowel sounds MSK no focal spinal tenderness, no joint edema Neuro: non-focal, well-oriented, appropriate affect Breasts: Deferred   Lab Results  Component Value Date   WBC 6.6 09/05/2023   HGB 13.5 09/05/2023   HCT 40.0 09/05/2023   MCV 93.0 09/05/2023   PLT 176 09/05/2023   No results found for: "FERRITIN", "IRON", "TIBC", "UIBC", "IRONPCTSAT" Lab Results  Component Value Date   RBC 4.30 09/05/2023   Lab Results  Component Value Date   KPAFRELGTCHN 8.7 08/05/2023   LAMBDASER  2.9 (L) 08/05/2023   KAPLAMBRATIO 3.00 (H) 08/05/2023   Lab Results  Component Value Date   IGGSERUM 244 (L) 08/05/2023   IGA 23 (L) 08/05/2023   IGMSERUM <5 (L) 08/05/2023   Lab Results  Component Value Date   TOTALPROTELP 6.0 08/05/2023   ALBUMINELP 3.6 08/05/2023   A1GS 0.3 08/05/2023   A2GS 1.0 08/05/2023   BETS 0.9 08/05/2023   GAMS 0.2 (L) 08/05/2023   MSPIKE Not Observed 08/05/2023   SPEI Comment 10/14/2022     Chemistry      Component Value Date/Time   NA 141 09/05/2023 1142   K 4.9 09/05/2023 1142   CL 106 09/05/2023 1142   CO2 24 09/05/2023 1142   BUN 14 09/05/2023 1142   CREATININE 1.44 (H) 09/05/2023 1142      Component Value Date/Time   CALCIUM 9.4 09/05/2023 1142   ALKPHOS 47 09/05/2023 1142   AST 12 (L) 09/05/2023 1142   ALT 10 09/05/2023 1142   BILITOT 0.7 09/05/2023 1142     Encounter Diagnosis  Name Primary?   Multiple myeloma, remission status unspecified (HCC)    Impression and Plan: Barry Gray is a pleasant 67 yo caucasian gentleman with IgA kappa myeloma. He completed chemo and went for transplant (11/2022).   So far he continues to tolerate treatment nicely now on single dose Faspro since completing Velcade.  Protein studies pending.  RTC 1 month MD, labs, Francoise Ceo, PA-C 10/21/202412:56 PM

## 2023-09-05 NOTE — Patient Instructions (Signed)
Daratumumab Injection What is this medication? DARATUMUMAB (dar a toom ue mab) treats multiple myeloma, a type of bone marrow cancer. It works by helping your immune system slow or stop the spread of cancer cells. It is a monoclonal antibody. This medicine may be used for other purposes; ask your health care provider or pharmacist if you have questions. COMMON BRAND NAME(S): DARZALEX What should I tell my care team before I take this medication? They need to know if you have any of these conditions: Hereditary fructose intolerance Infection, such as chickenpox, herpes, hepatitis B Lung or breathing disease, such as asthma, COPD An unusual or allergic reaction to daratumumab, sorbitol, other medications, foods, dyes, or preservatives Pregnant or trying to get pregnant Breastfeeding How should I use this medication? This medication is injected into a vein. It is given by your care team in a hospital or clinic setting. Talk to your care team about the use of this medication in children. Special care may be needed. Overdosage: If you think you have taken too much of this medicine contact a poison control center or emergency room at once. NOTE: This medicine is only for you. Do not share this medicine with others. What if I miss a dose? Keep appointments for follow-up doses. It is important not to miss your dose. Call your care team if you are unable to keep an appointment. What may interact with this medication? Interactions have not been studied. This list may not describe all possible interactions. Give your health care provider a list of all the medicines, herbs, non-prescription drugs, or dietary supplements you use. Also tell them if you smoke, drink alcohol, or use illegal drugs. Some items may interact with your medicine. What should I watch for while using this medication? Your condition will be monitored carefully while you are receiving this medication. This medication can cause  serious allergic reactions. To reduce your risk, your care team may give you other medication to take before receiving this one. Be sure to follow the directions from your care team. This medication can affect the results of blood tests to match your blood type. These changes can last for up to 6 months after the final dose. Your care team will do blood tests to match your blood type before you start treatment. Tell all of your care team that you are being treated with this medication before receiving a blood transfusion. This medication can affect the results of some tests used to determine treatment response; extra tests may be needed to evaluate response. Talk to your care team if you wish to become pregnant or think you are pregnant. This medication can cause serious birth defects if taken during pregnancy and for 3 months after the last dose. A reliable form of contraception is recommended while taking this medication and for 3 months after the last dose. Talk to your care team about effective forms of contraception. Do not breast-feed while taking this medication. What side effects may I notice from receiving this medication? Side effects that you should report to your care team as soon as possible: Allergic reactions--skin rash, itching, hives, swelling of the face, lips, tongue, or throat Infection--fever, chills, cough, sore throat, wounds that don't heal, pain or trouble when passing urine, general feeling of discomfort or being unwell Infusion reactions--chest pain, shortness of breath or trouble breathing, feeling faint or lightheaded Unusual bruising or bleeding Side effects that usually do not require medical attention (report to your care team if they continue or   are bothersome): Constipation Diarrhea Fatigue Nausea Pain, tingling, or numbness in the hands or feet Swelling of the ankles, hands, or feet This list may not describe all possible side effects. Call your doctor for medical  advice about side effects. You may report side effects to FDA at 1-800-FDA-1088. Where should I keep my medication? This medication is given in a hospital or clinic. It will not be stored at home. NOTE: This sheet is a summary. It may not cover all possible information. If you have questions about this medicine, talk to your doctor, pharmacist, or health care provider.  2024 Elsevier/Gold Standard (2022-09-09 00:00:00)  

## 2023-09-05 NOTE — Progress Notes (Signed)
Cbc and cmet reviewed by MD. OK to treat despite counts.

## 2023-09-06 ENCOUNTER — Other Ambulatory Visit: Payer: Self-pay

## 2023-09-06 LAB — KAPPA/LAMBDA LIGHT CHAINS
Kappa free light chain: 30 mg/L — ABNORMAL HIGH (ref 3.3–19.4)
Kappa, lambda light chain ratio: 10.34 — ABNORMAL HIGH (ref 0.26–1.65)
Lambda free light chains: 2.9 mg/L — ABNORMAL LOW (ref 5.7–26.3)

## 2023-09-06 LAB — IGG, IGA, IGM
IgA: 73 mg/dL (ref 61–437)
IgG (Immunoglobin G), Serum: 250 mg/dL — ABNORMAL LOW (ref 603–1613)
IgM (Immunoglobulin M), Srm: 6 mg/dL — ABNORMAL LOW (ref 20–172)

## 2023-09-07 LAB — PROTEIN ELECTROPHORESIS, SERUM
A/G Ratio: 1.6 (ref 0.7–1.7)
Albumin ELP: 4 g/dL (ref 2.9–4.4)
Alpha-1-Globulin: 0.3 g/dL (ref 0.0–0.4)
Alpha-2-Globulin: 1.1 g/dL — ABNORMAL HIGH (ref 0.4–1.0)
Beta Globulin: 1 g/dL (ref 0.7–1.3)
Gamma Globulin: 0.2 g/dL — ABNORMAL LOW (ref 0.4–1.8)
Globulin, Total: 2.5 g/dL (ref 2.2–3.9)
M-Spike, %: 0.1 g/dL — ABNORMAL HIGH
Total Protein ELP: 6.5 g/dL (ref 6.0–8.5)

## 2023-09-08 ENCOUNTER — Telehealth: Payer: Self-pay

## 2023-09-08 NOTE — Telephone Encounter (Signed)
Pt called inquiring about lab work being elevated, per Melody Haver recheck labs in 1 month to ensure that they are accurate and seeing how they trend over a much smaller timeframe. At his appointment we will talk about next steps if they are again elevated. Patient informed.

## 2023-09-30 ENCOUNTER — Inpatient Hospital Stay: Payer: Medicare Other | Attending: Hematology & Oncology

## 2023-09-30 ENCOUNTER — Encounter: Payer: Self-pay | Admitting: Hematology & Oncology

## 2023-09-30 ENCOUNTER — Inpatient Hospital Stay: Payer: Medicare Other

## 2023-09-30 ENCOUNTER — Inpatient Hospital Stay (HOSPITAL_BASED_OUTPATIENT_CLINIC_OR_DEPARTMENT_OTHER): Payer: Medicare Other | Admitting: Hematology & Oncology

## 2023-09-30 ENCOUNTER — Other Ambulatory Visit: Payer: Self-pay | Admitting: *Deleted

## 2023-09-30 VITALS — BP 140/73 | HR 63 | Temp 97.6°F | Resp 20 | Ht 70.0 in | Wt 180.0 lb

## 2023-09-30 DIAGNOSIS — C9000 Multiple myeloma not having achieved remission: Secondary | ICD-10-CM

## 2023-09-30 DIAGNOSIS — Z5112 Encounter for antineoplastic immunotherapy: Secondary | ICD-10-CM | POA: Diagnosis present

## 2023-09-30 LAB — CMP (CANCER CENTER ONLY)
ALT: 18 U/L (ref 0–44)
AST: 18 U/L (ref 15–41)
Albumin: 4.7 g/dL (ref 3.5–5.0)
Alkaline Phosphatase: 60 U/L (ref 38–126)
Anion gap: 10 (ref 5–15)
BUN: 14 mg/dL (ref 8–23)
CO2: 24 mmol/L (ref 22–32)
Calcium: 9.6 mg/dL (ref 8.9–10.3)
Chloride: 106 mmol/L (ref 98–111)
Creatinine: 1.45 mg/dL — ABNORMAL HIGH (ref 0.61–1.24)
GFR, Estimated: 53 mL/min — ABNORMAL LOW (ref >60)
Glucose, Bld: 143 mg/dL — ABNORMAL HIGH (ref 70–99)
Potassium: 4.3 mmol/L (ref 3.5–5.1)
Sodium: 140 mmol/L (ref 135–145)
Total Bilirubin: 0.6 mg/dL (ref <1.2)
Total Protein: 6.8 g/dL (ref 6.5–8.1)

## 2023-09-30 LAB — CBC WITH DIFFERENTIAL (CANCER CENTER ONLY)
Abs Immature Granulocytes: 0.01 10*3/uL (ref 0.00–0.07)
Basophils Absolute: 0 10*3/uL (ref 0.0–0.1)
Basophils Relative: 0 %
Eosinophils Absolute: 0.1 10*3/uL (ref 0.0–0.5)
Eosinophils Relative: 2 %
HCT: 39 % (ref 39.0–52.0)
Hemoglobin: 13.2 g/dL (ref 13.0–17.0)
Immature Granulocytes: 0 %
Lymphocytes Relative: 15 %
Lymphs Abs: 0.8 10*3/uL (ref 0.7–4.0)
MCH: 32 pg (ref 26.0–34.0)
MCHC: 33.8 g/dL (ref 30.0–36.0)
MCV: 94.7 fL (ref 80.0–100.0)
Monocytes Absolute: 0.6 10*3/uL (ref 0.1–1.0)
Monocytes Relative: 10 %
Neutro Abs: 3.9 10*3/uL (ref 1.7–7.7)
Neutrophils Relative %: 73 %
Platelet Count: 177 10*3/uL (ref 150–400)
RBC: 4.12 MIL/uL — ABNORMAL LOW (ref 4.22–5.81)
RDW: 13.2 % (ref 11.5–15.5)
WBC Count: 5.4 10*3/uL (ref 4.0–10.5)
nRBC: 0 % (ref 0.0–0.2)

## 2023-09-30 LAB — LACTATE DEHYDROGENASE: LDH: 105 U/L (ref 98–192)

## 2023-09-30 LAB — PHOSPHORUS: Phosphorus: 3.1 mg/dL (ref 2.5–4.6)

## 2023-09-30 LAB — MAGNESIUM: Magnesium: 1.8 mg/dL (ref 1.7–2.4)

## 2023-09-30 MED ORDER — DARATUMUMAB-HYALURONIDASE-FIHJ 1800-30000 MG-UT/15ML ~~LOC~~ SOLN
1800.0000 mg | Freq: Once | SUBCUTANEOUS | Status: AC
  Administered 2023-09-30: 1800 mg via SUBCUTANEOUS
  Filled 2023-09-30: qty 15

## 2023-09-30 MED ORDER — ACETAMINOPHEN 325 MG PO TABS
650.0000 mg | ORAL_TABLET | Freq: Once | ORAL | Status: AC
  Administered 2023-09-30: 650 mg via ORAL
  Filled 2023-09-30: qty 2

## 2023-09-30 MED ORDER — DIPHENHYDRAMINE HCL 25 MG PO CAPS
50.0000 mg | ORAL_CAPSULE | Freq: Once | ORAL | Status: AC
  Administered 2023-09-30: 50 mg via ORAL
  Filled 2023-09-30: qty 2

## 2023-09-30 NOTE — Patient Instructions (Signed)
[facility] - A DEPT OF [facility]  Discharge Instructions: Thank you for choosing [facility] to provide your oncology and hematology care.   If you have a lab appointment with the Cancer Center, please go directly to the Cancer Center and check in at the registration area.  Wear comfortable clothing and clothing appropriate for easy access to any Portacath or PICC line.   We strive to give you quality time with your provider. You may need to reschedule your appointment if you arrive late (15 or more minutes).  Arriving late affects you and other patients whose appointments are after yours.  Also, if you miss three or more appointments without notifying the office, you may be dismissed from the clinic at the provider's discretion.      For prescription refill requests, have your pharmacy contact our office and allow 72 hours for refills to be completed.    Today you received the following chemotherapy and/or immunotherapy agents Velcade, Darzalex      To help prevent nausea and vomiting after your treatment, we encourage you to take your nausea medication as directed.  BELOW ARE SYMPTOMS THAT SHOULD BE REPORTED IMMEDIATELY: *FEVER GREATER THAN 100.4 F (38 C) OR HIGHER *CHILLS OR SWEATING *NAUSEA AND VOMITING THAT IS NOT CONTROLLED WITH YOUR NAUSEA MEDICATION *UNUSUAL SHORTNESS OF BREATH *UNUSUAL BRUISING OR BLEEDING *URINARY PROBLEMS (pain or burning when urinating, or frequent urination) *BOWEL PROBLEMS (unusual diarrhea, constipation, pain near the anus) TENDERNESS IN MOUTH AND THROAT WITH OR WITHOUT PRESENCE OF ULCERS (sore throat, sores in mouth, or a toothache) UNUSUAL RASH, SWELLING OR PAIN  UNUSUAL VAGINAL DISCHARGE OR ITCHING   Items with * indicate a potential emergency and should be followed up as soon as possible or go to the Emergency Department if any problems should occur.  Please show the CHEMOTHERAPY ALERT CARD or  IMMUNOTHERAPY ALERT CARD at check-in to the Emergency Department and triage nurse. Should you have questions after your visit or need to cancel or reschedule your appointment, please contact [facility] - A DEPT OF [facility]igh[facility]  [phone] and follow the prompts.  Office hours are 8:00 a.m. to 4:30 p.m. Monday - Friday. Please note that voicemails left after 4:00 p.m. may not be returned until the following business day.  We are closed weekends and major holidays. You have access to a nurse at all times for urgent questions. Please call the main number to the clinic [phone] and follow the prompts.  For any non-urgent questions, you may also contact your provider using MyChart. We now offer e-Visits for anyone 40 and older to request care online for non-urgent symptoms. For details visit [email or web address].   Also download the MyChart app! Go to the app store, search "MyChart", open the app, select [place], and log in with your MyChart username and password.

## 2023-09-30 NOTE — Progress Notes (Signed)
For this.  I think Hematology and Oncology Follow Up Visit  CHILTON BARONA [id number] 07/24/1956 67 y.o. 09/30/2023   Principle Diagnosis:  IgA Kappa myeloma -- normal cytogenetics/FISH (-)  Current Therapy:   S/p cervical spine stabilization -- 04/28/2022 Faspro/Velcade/Decadron -- s/p cycle #6-- start on 05/20/2022 Zometa 4 mg IV q 3 months -- next dose on 10/2023 Status post autologous stem cell transplant -- [facility] on 12/03/2022 Faspro/Velcade -- maintenance -  start on 04/28/2023 -Velcade discontinued after 07/08/2023     Interim History:  Mr. Fazzolari is back for follow-up.  The last time that he is here, we saw that his Kappa light chain was up a little bit.  Not sure exactly what this indicates.  We had to be very careful with this.  When we last saw him, the Kappa light chain was 3.0 mg/dL.  His IgA level was 73 mg/dL.  The monoclonal spike was 0.1 g/L.  Again, I am little surprised by the light chain going up.  Again this is just 1 value.  We will have to see what today's value is.  If it is further elevated, we will have to certainly look into this.  He has been having some dental issues.  He apparently had a root canal.  He has some cavities.  His next Zometa scan being December.  This might be some we may have to think about holding off on.  He has had no problems with pain.  He has had no problems with nausea or vomiting.  He has had no issues with cough or shortness of breath.  He has had no leg swelling.  He has had no bleeding or bruising.  Overall, I would say his performance status is probably ECOG 1.        Medications:  Current Outpatient Medications:    acyclovir (ZOVIRAX) 400 MG tablet, Take 400 mg by mouth 2 (two) times daily., Disp: , Rfl:    lactulose (CHRONULAC) 10 GM/15ML solution, TAKE 15 MLS (10 G TOTAL) BY MOUTH 3 (THREE) TIMES DAILY. (Patient taking differently: Take 10 g by mouth daily as needed.), Disp: 946 mL, Rfl: 2   metFORMIN (GLUCOPHAGE) 500 MG  tablet, Take 500 mg by mouth 2 (two) times daily with a meal., Disp: , Rfl:    tamsulosin (FLOMAX) 0.4 MG CAPS capsule, Take 0.4 mg by mouth daily., Disp: , Rfl:    HYDROcodone-acetaminophen (NORCO) 7.5-325 MG tablet, Take 1 tablet by mouth every 6 (six) hours as needed. (Patient not taking: Reported on 09/30/2023), Disp: , Rfl:    levothyroxine (SYNTHROID) 50 MCG tablet, Take 50 mcg by mouth daily before breakfast. (Patient not taking: Reported on 09/30/2023), Disp: , Rfl:   Allergies:  Allergies  Allergen Reactions   Revlimid [Lenalidomide] Rash    Past Medical History, Surgical history, Social history, and Family History were reviewed and updated.  Review of Systems: Review of Systems  Constitutional: Negative.   HENT:  Negative.    Eyes: Negative.   Respiratory: Negative.    Cardiovascular: Negative.   Gastrointestinal: Negative.   Endocrine: Negative.   Genitourinary:  Positive for difficulty urinating. Negative for bladder incontinence.   Musculoskeletal:  Positive for neck pain.  Skin: Negative.   Neurological: Negative.   Hematological: Negative.   Psychiatric/Behavioral: Negative.      Physical Exam:  height is 5\' 10"  (1.778 m) and weight is 180 lb (81.6 kg). His oral temperature is 97.6 F (36.4 C). His blood pressure is 140/73 (  abnormal) and his pulse is 63. His respiration is 20 and oxygen saturation is 100%.   Wt Readings from Last 3 Encounters:  09/30/23 180 lb (81.6 kg)  09/05/23 178 lb 0.6 oz (80.8 kg)  08/05/23 183 lb 12.8 oz (83.4 kg)    Physical Exam Vitals reviewed.  HENT:     Head: Normocephalic and atraumatic.  Eyes:     Pupils: Pupils are equal, round, and reactive to light.  Neck:     Comments: His neck exam does show the healing cervical scar on the back of the neck.  This is a little bit firm.  He has no obvious adenopathy in the neck.  Cardiovascular:     Rate and Rhythm: Normal rate and regular rhythm.     Heart sounds: Normal heart  sounds.  Pulmonary:     Effort: Pulmonary effort is normal.     Breath sounds: Normal breath sounds.  Abdominal:     General: Bowel sounds are normal.     Palpations: Abdomen is soft.  Musculoskeletal:        General: No tenderness or deformity. Normal range of motion.     Cervical back: Normal range of motion.  Lymphadenopathy:     Cervical: No cervical adenopathy.  Skin:    General: Skin is warm and dry.     Findings: No erythema or rash.  Neurological:     Mental Status: He is alert and oriented to person, place, and time.  Psychiatric:        Behavior: Behavior normal.        Thought Content: Thought content normal.        Judgment: Judgment normal.      Lab Results  Component Value Date   WBC 5.4 09/30/2023   HGB 13.2 09/30/2023   HCT 39.0 09/30/2023   MCV 94.7 09/30/2023   PLT 177 09/30/2023     Chemistry      Component Value Date/Time   NA 141 09/05/2023 1142   K 4.9 09/05/2023 1142   CL 106 09/05/2023 1142   CO2 24 09/05/2023 1142   BUN 14 09/05/2023 1142   CREATININE 1.44 (H) 09/05/2023 1142      Component Value Date/Time   CALCIUM 9.4 09/05/2023 1142   ALKPHOS 47 09/05/2023 1142   AST 12 (L) 09/05/2023 1142   ALT 10 09/05/2023 1142   BILITOT 0.7 09/05/2023 1142      Impression and Plan: Mr. Yanak is a very nice 67 year old white male.  He has IgA kappa myeloma.  Most of his myeloma is Kappa light chain.  Again, his light chains have come down incredibly well with chemotherapy.  We then got him to transplant.  Again, we will have to see what the Kappa light chain is.  This will be incredibly important.  I think if the level is further elevated, we are going to have to do some further evaluation.  May have to think about a possible 24-hour urine on him.  We also made to think about a possible bone marrow biopsy.  We will still plan to get him back in 1 month.  We can get him back sooner if he has any problems with his labs.     Josph Macho,  MD 11/15/202412:20 PM

## 2023-09-30 NOTE — Progress Notes (Signed)
MD reviewed CBC and CMET. VO' OK to treat despite counts per MD."

## 2023-10-02 ENCOUNTER — Other Ambulatory Visit: Payer: Self-pay

## 2023-10-02 LAB — IGG, IGA, IGM
IgA: 221 mg/dL (ref 61–437)
IgG (Immunoglobin G), Serum: 236 mg/dL — ABNORMAL LOW (ref 603–1613)
IgM (Immunoglobulin M), Srm: 6 mg/dL — ABNORMAL LOW (ref 20–172)

## 2023-10-03 LAB — KAPPA/LAMBDA LIGHT CHAINS
Kappa free light chain: 108.7 mg/L — ABNORMAL HIGH (ref 3.3–19.4)
Kappa, lambda light chain ratio: 45.29 — ABNORMAL HIGH (ref 0.26–1.65)
Lambda free light chains: 2.4 mg/L — ABNORMAL LOW (ref 5.7–26.3)

## 2023-10-04 ENCOUNTER — Inpatient Hospital Stay: Payer: Medicare Other

## 2023-10-04 NOTE — Addendum Note (Signed)
Addended by: Arlan Organ R on: 10/04/2023 01:17 PM   Modules accepted: Orders

## 2023-10-04 NOTE — Addendum Note (Signed)
Addended by: Arlan Organ R on: 10/04/2023 01:12 PM   Modules accepted: Orders

## 2023-10-06 ENCOUNTER — Inpatient Hospital Stay: Payer: Medicare Other

## 2023-10-06 DIAGNOSIS — Z5112 Encounter for antineoplastic immunotherapy: Secondary | ICD-10-CM | POA: Diagnosis not present

## 2023-10-06 DIAGNOSIS — C9000 Multiple myeloma not having achieved remission: Secondary | ICD-10-CM

## 2023-10-06 LAB — PROTEIN ELECTROPHORESIS, SERUM
A/G Ratio: 1.4 (ref 0.7–1.7)
Albumin ELP: 3.7 g/dL (ref 2.9–4.4)
Alpha-1-Globulin: 0.3 g/dL (ref 0.0–0.4)
Alpha-2-Globulin: 1 g/dL (ref 0.4–1.0)
Beta Globulin: 1.1 g/dL (ref 0.7–1.3)
Gamma Globulin: 0.3 g/dL — ABNORMAL LOW (ref 0.4–1.8)
Globulin, Total: 2.6 g/dL (ref 2.2–3.9)
M-Spike, %: 0.3 g/dL — ABNORMAL HIGH
Total Protein ELP: 6.3 g/dL (ref 6.0–8.5)

## 2023-10-10 ENCOUNTER — Other Ambulatory Visit: Payer: Self-pay | Admitting: Urology

## 2023-10-10 DIAGNOSIS — Z01818 Encounter for other preprocedural examination: Secondary | ICD-10-CM

## 2023-10-11 LAB — UPEP/UIFE/LIGHT CHAINS/TP, 24-HR UR
% BETA, Urine: 30.3 %
ALPHA 1 URINE: 0.9 %
Albumin, U: 61.9 %
Alpha 2, Urine: 4.4 %
Free Kappa Lt Chains,Ur: 460.45 mg/L — ABNORMAL HIGH (ref 1.17–86.46)
Free Kappa/Lambda Ratio: 175.74 — ABNORMAL HIGH (ref 1.83–14.26)
Free Lambda Lt Chains,Ur: 2.62 mg/L (ref 0.27–15.21)
GAMMA GLOBULIN URINE: 2.5 %
M-SPIKE %, Urine: 21.4 % — ABNORMAL HIGH
M-Spike, Mg/24 Hr: 141 mg/(24.h) — ABNORMAL HIGH
Total Protein, Urine-Ur/day: 658 mg/(24.h) — ABNORMAL HIGH (ref 30–150)
Total Protein, Urine: 28 mg/dL
Total Volume: 2350

## 2023-10-11 NOTE — H&P (Signed)
Chief Complaint: Patient was seen in consultation today for bone marrow biopsy and aspiration for multiple myeloma evaluation, at the request of Dr. Josph Macho  Supervising Physician: Roanna Banning  Patient Status: [facility] - Out-pt  History of Present Illness: Barry Gray is a 67 y.o. male   FULL Code per patient and chart.  Patient is followed by Dr. Myna Hidalgo for IgA kappa myeloma s/p stem cell transplant on 12/03/22, at [facility]. His Kappa light chains have come down incredibly well with chemotherapy.  However, there has been a recent elevation in levels, prompting a request to IR for a bone marrow biopsy and aspiration. PET scan scheduled for 12/06.  Patient is scheduled today for bone marrow biopsy and aspiration in IR.  Past Medical History:  Diagnosis Date   Diabetes mellitus    Fracture of fibula, distal 10/2011   w/ mild posterior displacement of distal fracture fragments   History of radiation therapy    C-Spine 06/17/22-0815/23-Dr. Antony Blackbird   Kidney stone    Multiple myeloma (HCC) 05/07/2022    Past Surgical History:  Procedure Laterality Date   ANTERIOR CERVICAL DECOMPRESSION/DISCECTOMY FUSION 4 LEVELS N/A 04/28/2022   Procedure: ANTERIOR CERVICAL DECOMPRESSION FUSION CERVICAL 5- CERVICAL 6, CERVICAL 6- CERVICAL 7, CERVICAL 7 - THORACIC 1 , Thoracic 1 - thoracic 2 WITH CERVICAL 7 CORPECTOMY WITH INSTRUMENTATION AND ALLOGRAFT;  Surgeon: Estill Bamberg, MD;  Location: [facility] OR;  Service: Orthopedics;  Laterality: N/A;   COLONOSCOPY     POSTERIOR CERVICAL FUSION/FORAMINOTOMY  04/28/2022   Procedure: POSTERIOR CERVICAL FUSION/FORAMINOTOMY LEVEL 3;  Surgeon: Estill Bamberg, MD;  Location: [facility] OR;  Service: Orthopedics;;    Allergies: Revlimid [lenalidomide]  Medications: Prior to Admission medications   Medication Sig Start Date End Date Taking? Authorizing Provider  acyclovir (ZOVIRAX) 400 MG tablet Take 400 mg by mouth 2 (two) times daily. 12/01/22   [provider]  HYDROcodone-acetaminophen (NORCO) 7.5-325 MG tablet Take 1 tablet by mouth every 6 (six) hours as needed. Patient not taking: Reported on 09/30/2023 09/13/23   [provider]  lactulose (CHRONULAC) 10 GM/15ML solution TAKE 15 MLS (10 G TOTAL) BY MOUTH 3 (THREE) TIMES DAILY. Patient taking differently: Take 10 g by mouth daily as needed. 08/16/23   Josph Macho, MD  levothyroxine (SYNTHROID) 50 MCG tablet Take 50 mcg by mouth daily before breakfast. Patient not taking: Reported on 09/30/2023 04/14/23   [provider]  metFORMIN (GLUCOPHAGE) 500 MG tablet Take 500 mg by mouth 2 (two) times daily with a meal.    [provider]  tamsulosin (FLOMAX) 0.4 MG CAPS capsule Take 0.4 mg by mouth daily. 05/06/22   [provider]     Family History  Problem Relation Age of Onset   Cancer Mother     Social History   Socioeconomic History   Marital status: Married    Spouse name: Not on file   Number of children: Not on file   Years of education: Not on file   Highest education level: Not on file  Occupational History   Not on file  Tobacco Use   Smoking status: Former    Current packs/day: 0.00    Average packs/day: 1 pack/day for 15.0 years (15.0 ttl pk-yrs)    Types: Cigarettes    Start date: 39    Quit date: 4    Years since quitting: 34.9   Smokeless tobacco: Never  Vaping Use   Vaping status: Never Used  Substance and  Sexual Activity   Alcohol use: No   Drug use: No   Sexual activity: Not Currently  Other Topics Concern   Not on file  Social History Narrative   Not on file   Social Determinants of Health   Financial Resource Strain: Low Risk  (12/11/2022)   Received from [facility], [facility]   Overall Financial Resource Strain (CARDIA)    Difficulty of Paying Living Expenses: Not very hard  Food Insecurity: No Food Insecurity (12/11/2022)   Received from [facility], [facility]   Hunger Vital Sign    Worried About Running Out of Food in the Last Year: Never true    Ran Out of Food in the Last Year: Never true  Transportation Needs: No Transportation Needs (12/13/2022)   Received from [facility], [facility]   [facility] - Transportation    In the past 12 months, has lack of transportation kept you from medical appointments or from getting medications?: No    Lack of Transportation (Non-Medical): No  Physical Activity: Inactive (05/24/2022)   Exercise Vital Sign    Days of Exercise per Week: 0 days    Minutes of Exercise per Session: 0 min  Stress: Not on file  Social Connections: Not on file    Review of Systems: A 12 point ROS discussed and pertinent positives are indicated in the HPI above.  All other systems are negative.  Review of Systems  Vital Signs: There were no vitals taken for this visit.  Advance Care Plan: {Advance Care ZOXW:[id number]}    Physical Exam  Imaging: No results found.  Labs:  CBC: Recent Labs    07/08/23 0949 08/05/23 1010 09/05/23 1142 09/30/23 1140  WBC 6.4 6.0 6.6 5.4  HGB 13.4 13.2 13.5 13.2  HCT 40.2 39.3 40.0 39.0  PLT 153 150 176 177    COAGS: No results for input(s): "INR", "APTT" in the last 8760 hours.  BMP: Recent Labs    07/08/23 0949 08/05/23 1010 09/05/23 1142 09/30/23 1140  NA 140 140 141 140  K 4.7 4.8 4.9 4.3  CL 106 104 106 106  CO2 24 26 24 24   GLUCOSE 111* 106* 101* 143*  BUN 17 14 14 14   CALCIUM 9.3 8.9 9.4 9.6  CREATININE 1.47* 1.41* 1.44* 1.45*  GFRNONAA 52* 55* 53* 53*    LIVER FUNCTION TESTS: Recent Labs    07/08/23 0949 08/05/23 1010 09/05/23 1142 09/30/23 1140  BILITOT 0.6 0.6 0.7 0.6  AST 11* 10* 12* 18  ALT 8 8 10 18   ALKPHOS 45 44 47 60  PROT 6.6 6.3* 6.5 6.8  ALBUMIN 4.6 4.3 4.8 4.7    TUMOR MARKERS: No results for input(s): "AFPTM", "CEA", "CA199", "CHROMGRNA" in the last 8760  hours.  Assessment and Plan:  There has been a recent elevation in levels patient's Kappa light chain levels, prompting a request to IR for a bone marrow biopsy and aspiration. PET scan scheduled for 12/06.  Patient presents today for bone marrow biopsy and aspiration in IR.  Risks and benefits of bone marrow biopsy and aspiration was discussed with the patient and/or patient's family including, but not limited to bleeding, infection, damage to adjacent structures or low yield requiring additional tests.  All of the questions were answered and there is agreement to proceed.  Consent signed and in chart.   Thank you for this interesting consult.  I greatly enjoyed meeting  Barbee Cough and look forward to participating in their care.  A copy of this report was sent to the requesting provider on this date.  Electronically Signed: Sable Feil, PA-C 10/11/2023, 3:32 PM   I spent a total of  30 Minutes   in face to face in clinical consultation, greater than 50% of which was counseling/coordinating care for bone marrow biopsy and aspiration

## 2023-10-12 ENCOUNTER — Other Ambulatory Visit: Payer: Self-pay

## 2023-10-12 ENCOUNTER — Inpatient Hospital Stay (HOSPITAL_COMMUNITY)
Admission: RE | Admit: 2023-10-12 | Discharge: 2023-10-12 | Discharge status: Home / Self Care | Payer: Medicare Other | Source: Ambulatory Visit | Attending: Hematology & Oncology

## 2023-10-12 ENCOUNTER — Ambulatory Visit (HOSPITAL_COMMUNITY)
Admission: RE | Admit: 2023-10-12 | Discharge: 2023-10-12 | Disposition: A | Discharge status: Home / Self Care | Payer: Medicare Other | Source: Ambulatory Visit | Attending: Hematology & Oncology | Admitting: Hematology & Oncology

## 2023-10-12 ENCOUNTER — Telehealth (HOSPITAL_COMMUNITY): Payer: Medicare Other

## 2023-10-12 ENCOUNTER — Encounter (HOSPITAL_COMMUNITY): Payer: Self-pay

## 2023-10-12 DIAGNOSIS — Z1379 Encounter for other screening for genetic and chromosomal anomalies: Secondary | ICD-10-CM | POA: Insufficient documentation

## 2023-10-12 DIAGNOSIS — Z9484 Stem cells transplant status: Secondary | ICD-10-CM | POA: Diagnosis not present

## 2023-10-12 DIAGNOSIS — Z01818 Encounter for other preprocedural examination: Secondary | ICD-10-CM

## 2023-10-12 DIAGNOSIS — C9000 Multiple myeloma not having achieved remission: Secondary | ICD-10-CM

## 2023-10-12 LAB — CBC
HCT: 39 % (ref 39.0–52.0)
Hemoglobin: 12.6 g/dL — ABNORMAL LOW (ref 13.0–17.0)
MCH: 31.4 pg (ref 26.0–34.0)
MCHC: 32.3 g/dL (ref 30.0–36.0)
MCV: 97.3 fL (ref 80.0–100.0)
Platelets: 180 10*3/uL (ref 150–400)
RBC: 4.01 MIL/uL — ABNORMAL LOW (ref 4.22–5.81)
RDW: 13.2 % (ref 11.5–15.5)
WBC: 5.4 10*3/uL (ref 4.0–10.5)
nRBC: 0 % (ref 0.0–0.2)

## 2023-10-12 LAB — PROTIME-INR
INR: 1 (ref 0.8–1.2)
Prothrombin Time: 13.4 s (ref 11.4–15.2)

## 2023-10-12 LAB — GLUCOSE, CAPILLARY: Glucose-Capillary: 111 mg/dL — ABNORMAL HIGH (ref 70–99)

## 2023-10-12 MED ORDER — MIDAZOLAM HCL 2 MG/2ML IJ SOLN
INTRAMUSCULAR | Status: AC | PRN
  Administered 2023-10-12: 1 mg via INTRAVENOUS

## 2023-10-12 MED ORDER — NALOXONE HCL 0.4 MG/ML IJ SOLN
INTRAMUSCULAR | Status: AC
Start: 2023-10-12 — End: ?
  Filled 2023-10-12: qty 1

## 2023-10-12 MED ORDER — FLUMAZENIL 0.5 MG/5ML IV SOLN
INTRAVENOUS | Status: AC
  Filled 2023-10-12: qty 5

## 2023-10-12 MED ORDER — MIDAZOLAM HCL 2 MG/2ML IJ SOLN
INTRAMUSCULAR | Status: AC
Start: 2023-10-12 — End: ?
  Filled 2023-10-12: qty 4

## 2023-10-12 MED ORDER — FENTANYL CITRATE (PF) 100 MCG/2ML IJ SOLN
INTRAMUSCULAR | Status: AC | PRN
  Administered 2023-10-12: 50 ug via INTRAVENOUS

## 2023-10-12 MED ORDER — SODIUM CHLORIDE 0.9 % IV SOLN: INTRAVENOUS | Status: DC

## 2023-10-12 MED ORDER — FENTANYL CITRATE (PF) 100 MCG/2ML IJ SOLN
INTRAMUSCULAR | Status: AC
  Filled 2023-10-12: qty 4

## 2023-10-12 NOTE — Procedures (Signed)
Vascular and Interventional Radiology Procedure Note  Patient: Barry Gray DOB: 15-May-1956 Medical Record Number: [id number] Note Date/Time: 10/12/23 9:28 AM   Performing Physician: Roanna Banning, MD Assistant(s): None  Diagnosis: Hx Myeloma   Procedure: BONE MARROW ASPIRATION and BIOPSY  Anesthesia: Conscious Sedation Complications: None Estimated Blood Loss: Minimal Specimens: Sent for Pathology  Findings:  Successful CT-guided bone marrow aspiration and biopsy A total of 1 cores were obtained. Hemostasis of the tract was achieved using Manual Pressure.  Plan: Bed rest for 1 hours.  See detailed procedure note with images in PACS. The patient tolerated the procedure well without incident or complication and was returned to Recovery in stable condition.    Roanna Banning, MD Vascular [facility] Interventional Radiology [facility] Radiology   Pager. [phone] Clinic. [phone]

## 2023-10-12 NOTE — Discharge Instructions (Signed)

## 2023-10-15 ENCOUNTER — Other Ambulatory Visit: Payer: Self-pay

## 2023-10-16 ENCOUNTER — Other Ambulatory Visit: Payer: Self-pay

## 2023-10-19 LAB — SURGICAL PATHOLOGY

## 2023-10-20 ENCOUNTER — Encounter (HOSPITAL_COMMUNITY): Payer: Self-pay | Admitting: Hematology & Oncology

## 2023-10-21 ENCOUNTER — Ambulatory Visit (HOSPITAL_COMMUNITY)
Admission: RE | Admit: 2023-10-21 | Discharge: 2023-10-21 | Disposition: A | Discharge status: Home / Self Care | Payer: Medicare Other | Source: Ambulatory Visit | Attending: Hematology & Oncology | Admitting: Hematology & Oncology

## 2023-10-21 DIAGNOSIS — C9000 Multiple myeloma not having achieved remission: Secondary | ICD-10-CM | POA: Insufficient documentation

## 2023-10-21 LAB — GLUCOSE, CAPILLARY: Glucose-Capillary: 105 mg/dL — ABNORMAL HIGH (ref 70–99)

## 2023-10-21 MED ORDER — FLUDEOXYGLUCOSE F - 18 (FDG) INJECTION
8.9700 | Freq: Once | INTRAVENOUS | Status: AC
  Administered 2023-10-21: 8.97 via INTRAVENOUS

## 2023-10-25 ENCOUNTER — Encounter (HOSPITAL_COMMUNITY): Payer: Self-pay | Admitting: Hematology & Oncology

## 2023-10-31 ENCOUNTER — Other Ambulatory Visit: Payer: Self-pay

## 2023-10-31 ENCOUNTER — Inpatient Hospital Stay: Payer: Medicare Other | Attending: Hematology & Oncology

## 2023-10-31 ENCOUNTER — Other Ambulatory Visit (HOSPITAL_COMMUNITY): Payer: Self-pay

## 2023-10-31 ENCOUNTER — Inpatient Hospital Stay: Payer: Medicare Other

## 2023-10-31 ENCOUNTER — Telehealth: Payer: Self-pay | Admitting: Pharmacist

## 2023-10-31 ENCOUNTER — Inpatient Hospital Stay (HOSPITAL_BASED_OUTPATIENT_CLINIC_OR_DEPARTMENT_OTHER): Payer: Medicare Other | Admitting: Hematology & Oncology

## 2023-10-31 ENCOUNTER — Telehealth: Payer: Self-pay

## 2023-10-31 ENCOUNTER — Encounter: Payer: Self-pay | Admitting: Hematology & Oncology

## 2023-10-31 VITALS — BP 131/82 | HR 66 | Temp 97.5°F | Resp 18 | Ht 70.0 in | Wt 176.0 lb

## 2023-10-31 DIAGNOSIS — C9000 Multiple myeloma not having achieved remission: Secondary | ICD-10-CM | POA: Diagnosis not present

## 2023-10-31 DIAGNOSIS — R9431 Abnormal electrocardiogram [ECG] [EKG]: Secondary | ICD-10-CM | POA: Diagnosis not present

## 2023-10-31 DIAGNOSIS — Z5112 Encounter for antineoplastic immunotherapy: Secondary | ICD-10-CM | POA: Insufficient documentation

## 2023-10-31 LAB — CMP (CANCER CENTER ONLY)
ALT: 102 U/L — ABNORMAL HIGH (ref 0–44)
AST: 70 U/L — ABNORMAL HIGH (ref 15–41)
Albumin: 4 g/dL (ref 3.5–5.0)
Alkaline Phosphatase: 295 U/L — ABNORMAL HIGH (ref 38–126)
Anion gap: 11 (ref 5–15)
BUN: 15 mg/dL (ref 8–23)
CO2: 24 mmol/L (ref 22–32)
Calcium: 9.6 mg/dL (ref 8.9–10.3)
Chloride: 103 mmol/L (ref 98–111)
Creatinine: 1.62 mg/dL — ABNORMAL HIGH (ref 0.61–1.24)
GFR, Estimated: 46 mL/min — ABNORMAL LOW (ref >60)
Glucose, Bld: 126 mg/dL — ABNORMAL HIGH (ref 70–99)
Potassium: 4.7 mmol/L (ref 3.5–5.1)
Sodium: 138 mmol/L (ref 135–145)
Total Bilirubin: 0.9 mg/dL (ref <1.2)
Total Protein: 8.1 g/dL (ref 6.5–8.1)

## 2023-10-31 LAB — LACTATE DEHYDROGENASE: LDH: 137 U/L (ref 98–192)

## 2023-10-31 LAB — CBC WITH DIFFERENTIAL (CANCER CENTER ONLY)
Abs Immature Granulocytes: 0.02 10*3/uL (ref 0.00–0.07)
Basophils Absolute: 0 10*3/uL (ref 0.0–0.1)
Basophils Relative: 0 %
Eosinophils Absolute: 0.1 10*3/uL (ref 0.0–0.5)
Eosinophils Relative: 1 %
HCT: 36.7 % — ABNORMAL LOW (ref 39.0–52.0)
Hemoglobin: 12.1 g/dL — ABNORMAL LOW (ref 13.0–17.0)
Immature Granulocytes: 0 %
Lymphocytes Relative: 13 %
Lymphs Abs: 0.7 10*3/uL (ref 0.7–4.0)
MCH: 32.1 pg (ref 26.0–34.0)
MCHC: 33 g/dL (ref 30.0–36.0)
MCV: 97.3 fL (ref 80.0–100.0)
Monocytes Absolute: 0.7 10*3/uL (ref 0.1–1.0)
Monocytes Relative: 14 %
Neutro Abs: 3.7 10*3/uL (ref 1.7–7.7)
Neutrophils Relative %: 72 %
Platelet Count: 205 10*3/uL (ref 150–400)
RBC: 3.77 MIL/uL — ABNORMAL LOW (ref 4.22–5.81)
RDW: 13.2 % (ref 11.5–15.5)
WBC Count: 5.2 10*3/uL (ref 4.0–10.5)
nRBC: 0 % (ref 0.0–0.2)

## 2023-10-31 MED ORDER — POMALIDOMIDE 3 MG PO CAPS
3.0000 mg | ORAL_CAPSULE | Freq: Every day | ORAL | 0 refills | Status: DC
Start: 2023-10-31 — End: 2023-11-21

## 2023-10-31 NOTE — Progress Notes (Signed)
DISCONTINUE ON PATHWAY REGIMEN - Multiple Myeloma and Other Plasma Cell Dyscrasias   DaraVRd (Daratumumab IV + Bortezomib SUBQ + Lenalidomide PO + Dexamethasone IV/PO) q21 Days (Induction Schema):   A cycle is every 21 days:     Lenalidomide      Dexamethasone      Bortezomib      Daratumumab    DaraVRd (Daratumumab IV + Bortezomib SUBQ + Lenalidomide PO + Dexamethasone IV/PO) q21 Days (Consolidation Schema):   A cycle is every 21 days:     Lenalidomide      Dexamethasone      Bortezomib      Daratumumab   **Always confirm dose/schedule in your pharmacy ordering system**  PRIOR TREATMENT: [id number]: DaraVRd (Daratumumab 16 mg/kg IV + Bortezomib 1.3 mg/m2 SUBQ D1, 4, 8, 11 + Lenalidomide 25 mg PO + Dexamethasone 20 mg PO/IV) q21 Days Concurrent with Referral to Transplant Service  START OFF PATHWAY REGIMEN - Multiple Myeloma and Other Plasma Cell Dyscrasias   [id number]:Carfilzomib 20/27 mg/m2 Monotherapy q28 Days:   A cycle is every 28 days:     Carfilzomib      Carfilzomib      Carfilzomib      Carfilzomib   **Always confirm dose/schedule in your pharmacy ordering system**  Patient Characteristics: Multiple Myeloma, Relapsed / Refractory, Second through Fourth Lines of Therapy, Candidate for CAR T-cell Therapy Disease Classification: Multiple Myeloma Therapeutic Status: Relapsed R2-ISS Staging: III Line of Therapy: Third Line Intent of Therapy: Curative Intent, Discussed with Patient

## 2023-10-31 NOTE — Telephone Encounter (Signed)
Oral Oncology Patient Advocate Encounter  New authorization   Received notification that prior authorization for Pomalyst is required.   PA submitted on 10/31/23  Key [id number]  Status is pending     Ardeen Fillers, CPhT Oncology Pharmacy Patient Advocate  [facility]  [phone] (phone) [phone] (fax) 10/31/2023 9:34 AM

## 2023-10-31 NOTE — Progress Notes (Signed)
For this.  I think Hematology and Oncology Follow Up Visit  Barry Gray [id number] December 03, 1955 67 y.o. 10/31/2023   Principle Diagnosis:  IgA Kappa myeloma -- normal cytogenetics/FISH (-) --relapse  Current Therapy:   S/p cervical spine stabilization -- 04/28/2022 Faspro/Velcade/Decadron -- s/p cycle #6-- start on 05/20/2022 Zometa 4 mg IV q 3 months -- next dose on 10/2023 Status post autologous stem cell transplant -- [facility] on 12/03/2022 Faspro/Velcade -- maintenance -  start on 04/28/2023 -Velcade discontinued after 07/08/2023 Kyprolis/pomalidomide --*on 11/14/2023     Interim History:  Barry Gray is back for follow-up.  Unfortunately, his myeloma is already recurred.  We have proof of this.  He did a 24-hour urine 10/06/2023.  This showed 4 to 60 mg/L of kappa light chain.  His last serum kappa light chain was 10.9 mg/dL.  Again this is going up quickly.  We did do a bone marrow biopsy on him.  This was done on 10/12/2023.  The pathology report ([id number]) showed only 2% plasma cells in the aspirate.  There is some interstitial infiltrates and variably sized aggregates of atypical plasma cells with kappa chain restriction.  We did do a PET scan on him.  This was done on 10/21/2023.  This did show that he had activity in several bones.  However, glad he has some activity in his liver.  I know this is quite unusual for myeloma to go to the liver.  As such,  To get a liver biopsy to confirm that this is truly myeloma.  Of note, he had chromosomes and FISH panel on the aspirate.  All was normal.  I just hate that that he is going through this.  I really thought that he would have a long-term remission.  He had a great response to upfront chemotherapy.  I did speak with Dr. Jacqulyn Gray at [facility].  He will see Barry Gray probably right after the holidays.  He says he would be a candidate for CAR-T therapy.  For right now, we will see about getting him on chemotherapy with Kyprolis and  pomalidomide.  I think this would be a good combination for him.  He will need to have a echocardiogram done.  I did really hate that we have to do this with the.  I know that he is not looking forward to this.  However, I really think that he would tolerate treatment pretty well.  Overall, I would say that his performance status is probably ECOG 1.  is probably ECOG 1.        Medications:  Current Outpatient Medications:    acyclovir (ZOVIRAX) 400 MG tablet, Take 400 mg by mouth 2 (two) times daily., Disp: , Rfl:    HYDROcodone-acetaminophen (NORCO) 7.5-325 MG tablet, Take 1 tablet by mouth every 6 (six) hours as needed. (Patient not taking: Reported on 09/30/2023), Disp: , Rfl:    lactulose (CHRONULAC) 10 GM/15ML solution, TAKE 15 MLS (10 G TOTAL) BY MOUTH 3 (THREE) TIMES DAILY. (Patient taking differently: Take 10 g by mouth daily as needed.), Disp: 946 mL, Rfl: 2   levothyroxine (SYNTHROID) 50 MCG tablet, Take 50 mcg by mouth daily before breakfast. (Patient not taking: Reported on 09/30/2023), Disp: , Rfl:    metFORMIN (GLUCOPHAGE) 500 MG tablet, Take 500 mg by mouth 2 (two) times daily with a meal., Disp: , Rfl:    tamsulosin (FLOMAX) 0.4 MG CAPS capsule, Take 0.4 mg by mouth daily., Disp: , Rfl:   Allergies:  Allergies  Allergen  Reactions   Revlimid [Lenalidomide] Rash    Past Medical History, Surgical history, Social history, and Family History were reviewed and updated.  Review of Systems: Review of Systems  Constitutional: Negative.   HENT:  Negative.    Eyes: Negative.   Respiratory: Negative.    Cardiovascular: Negative.   Gastrointestinal: Negative.   Endocrine: Negative.   Genitourinary:  Positive for difficulty urinating. Negative for bladder incontinence.   Musculoskeletal:  Positive for neck pain.  Skin: Negative.   Neurological: Negative.   Hematological: Negative.   Psychiatric/Behavioral: Negative.      Physical Exam: Vital signs are temperature of  97.5.  Pulse 66.  Blood pressure 131/82.  Weight is 176 pounds.  Wt Readings from Last 3 Encounters:  09/30/23 180 lb (81.6 kg)  09/05/23 178 lb 0.6 oz (80.8 kg)  08/05/23 183 lb 12.8 oz (83.4 kg)    Physical Exam Vitals reviewed.  HENT:     Head: Normocephalic and atraumatic.  Eyes:     Pupils: Pupils are equal, round, and reactive to light.  Neck:     Comments: His neck exam does show the healing cervical scar on the back of the neck.  This is a little bit firm.  He has no obvious adenopathy in the neck.  Cardiovascular:     Rate and Rhythm: Normal rate and regular rhythm.     Heart sounds: Normal heart sounds.  Pulmonary:     Effort: Pulmonary effort is normal.     Breath sounds: Normal breath sounds.  Abdominal:     General: Bowel sounds are normal.     Palpations: Abdomen is soft.  Musculoskeletal:        General: No tenderness or deformity. Normal range of motion.     Cervical back: Normal range of motion.  Lymphadenopathy:     Cervical: No cervical adenopathy.  Skin:    General: Skin is warm and dry.     Findings: No erythema or rash.  Neurological:     Mental Status: He is alert and oriented to person, place, and time.  Psychiatric:        Behavior: Behavior normal.        Thought Content: Thought content normal.        Judgment: Judgment normal.     Lab Results  Component Value Date   WBC 5.2 10/31/2023   HGB 12.1 (L) 10/31/2023   HCT 36.7 (L) 10/31/2023   MCV 97.3 10/31/2023   PLT 205 10/31/2023     Chemistry      Component Value Date/Time   NA 140 09/30/2023 1140   K 4.3 09/30/2023 1140   CL 106 09/30/2023 1140   CO2 24 09/30/2023 1140   BUN 14 09/30/2023 1140   CREATININE 1.45 (H) 09/30/2023 1140      Component Value Date/Time   CALCIUM 9.6 09/30/2023 1140   ALKPHOS 60 09/30/2023 1140   AST 18 09/30/2023 1140   ALT 18 09/30/2023 1140   BILITOT 0.6 09/30/2023 1140      Impression and Plan: Barry Gray is a very nice 67 year old white  male.  He has IgA kappa myeloma.  Most of his myeloma is Kappa light chain.  He clearly has recurrence of the Kappa light chain.  Again, I really thought that he would be in remission for quite a while.  We certainly have options.  We will start him on chemotherapy and then get him to CAR-see therapy or  Bispeciific therapy.  I  would think that if these were to be done this up I would be in the Spring of 2025.  I do think he would do well with Kyprolis and pomalidomide.  I know that he did have a problem with the Revlimid with a rash.  I told to make sure he takes a full aspirin (325 milligrams) with food.  We will try to get started after Christmas.  I just, again, really hates that we have to treat him again.      Josph Macho, MD 12/16/20248:11 AM

## 2023-11-01 ENCOUNTER — Telehealth: Payer: Self-pay

## 2023-11-01 ENCOUNTER — Other Ambulatory Visit: Payer: Self-pay | Admitting: *Deleted

## 2023-11-01 ENCOUNTER — Other Ambulatory Visit: Payer: Self-pay

## 2023-11-01 ENCOUNTER — Other Ambulatory Visit (HOSPITAL_COMMUNITY): Payer: Self-pay

## 2023-11-01 DIAGNOSIS — C9000 Multiple myeloma not having achieved remission: Secondary | ICD-10-CM

## 2023-11-01 LAB — IGG, IGA, IGM
IgA: 1614 mg/dL — ABNORMAL HIGH (ref 61–437)
IgG (Immunoglobin G), Serum: 226 mg/dL — ABNORMAL LOW (ref 603–1613)
IgM (Immunoglobulin M), Srm: <5 mg/dL — ABNORMAL LOW (ref 20–172)

## 2023-11-01 LAB — KAPPA/LAMBDA LIGHT CHAINS
Kappa free light chain: 534.3 mg/L — ABNORMAL HIGH (ref 3.3–19.4)
Kappa, lambda light chain ratio: 314.29 — ABNORMAL HIGH (ref 0.26–1.65)
Lambda free light chains: 1.7 mg/L — ABNORMAL LOW (ref 5.7–26.3)

## 2023-11-01 MED ORDER — POMALIDOMIDE 3 MG PO CAPS: 3.0000 mg | ORAL_CAPSULE | Freq: Every day | ORAL | 0 refills | Status: DC

## 2023-11-01 MED ORDER — POMALIDOMIDE 3 MG PO CAPS: ORAL_CAPSULE | ORAL | 0 refills | Status: DC

## 2023-11-01 NOTE — Telephone Encounter (Signed)
Oral Oncology Patient Advocate Encounter  Prior Authorization for Pomalyst has been approved.    PA# [id number]  Effective dates: 10/18/23 through 11/14/98  Patients co-pay is $2,771.95.  Patient must fill at [facility].  [employer] obtained to make co-pay $0.00.  Script and all supporting documents sent to [facility] for processing and fulfillment.    Ardeen Fillers, CPhT Oncology Pharmacy Patient Advocate  [facility]  [phone] (phone) [phone] (fax) 11/01/2023 8:59 AM

## 2023-11-01 NOTE — Telephone Encounter (Signed)
Oral Oncology Patient Advocate Encounter  Was successful in securing patient a $12,000.00 grant from [facility] to provide copayment coverage for Pomalyst.  This will keep the out of pocket expense at $0.     [employer] ID: [id number]   The billing information is as follows and has been shared with [facility].    RxBin: [id number] PCN: [id number] Member ID: [id number] Group ID: [id number] Dates of Eligibility: 10/02/23 through 09/30/24  Fund:  Multiple Myeloma - Medicare Access   Ardeen Fillers, CPhT Oncology Pharmacy Patient Advocate  [facility]  [phone] (phone) [phone] (fax) 11/01/2023 8:51 AM

## 2023-11-01 NOTE — Telephone Encounter (Signed)
Oral Oncology Pharmacist Encounter  Received new prescription for Pomalyst (pomalidomide) for the treatment of R/R multiple myeloma in conjunction with carfilzomib, planned duration until disease progression or unacceptable drug toxicity.  CBC w/ Diff and CMP from 10/31/23 assessed, patient with Scr of 1.62 mg/dL (CrCl ~40 mL/min). Patient starting on dose reduction of Pomalyst per MD discretion. Patient with elevated AST and ALT, but T.bili WNL. Prescription dose and frequency assessed for appropriateness.   Current medication list in Epic reviewed, no relevant/significant DDIs with Pomalyst identified.  Evaluated chart and no patient barriers to medication adherence noted.   Prescription will need to be e-scribed to [facility] for dispensing.   Oral Oncology Clinic will continue to follow for insurance authorization, copayment issues, initial counseling and start date.  Sherry Ruffing, PharmD, BCPS, BCOP Hematology/Oncology Clinical Pharmacist [facility] Oral Chemotherapy Navigation Clinics [phone] 11/01/2023 8:37 AM

## 2023-11-02 ENCOUNTER — Other Ambulatory Visit: Payer: Self-pay | Admitting: *Deleted

## 2023-11-02 DIAGNOSIS — C9000 Multiple myeloma not having achieved remission: Secondary | ICD-10-CM

## 2023-11-02 MED ORDER — POMALIDOMIDE 3 MG PO CAPS: ORAL_CAPSULE | ORAL | 0 refills | Status: DC

## 2023-11-02 NOTE — Progress Notes (Signed)
Richarda Overlie, MD  Claudean Kinds PROCEDURE / BIOPSY REVIEW Date: 11/02/23  Requested Biopsy site: liver lesion Reason for request: Needs tissue diagnosis Imaging review: PET CT 10/21/23  Decision: Approved Imaging modality to perform: Ultrasound Schedule with: Moderate Sedation Schedule for: Any VIR  Additional comments: US guided liver lesion biopsy  Please contact me with questions, concerns, or if issue pertaining to this request arise.  Arn Medal, MD [facility]       Previous Messages    ----- Message ----- From: Claudean Kinds Sent: 10/31/2023   8:46 AM EST To: Claudean Kinds; Ir Procedure Requests Subject: CT liver mass biopsy                          Procedure : CT liver mass biopsy  Reason : Recurrent myeloma.  Positive PET scan for the liver.  Need to documented myeloma in the liver. Dx: Multiple myeloma not having achieved remission (HCC) [C90.00 (ICD-10-CM)]    History: NM PET restage skull base to thigh, CT bmbx  Provider : Josph Macho, MD  Provider contact: [phone]

## 2023-11-03 ENCOUNTER — Other Ambulatory Visit: Payer: Self-pay

## 2023-11-04 NOTE — Telephone Encounter (Signed)
Received notification from [facility] that patient has scheduled delivery of medication for Tuesday, 11/08/23 with a $0.00 co-pay.    Ardeen Fillers, CPhT Oncology Pharmacy Patient Advocate  [facility]  [phone] (phone) [phone] (fax) 11/04/2023 12:57 PM

## 2023-11-05 IMAGING — MR MR CERVICAL SPINE WO/W CM
5 of 8 series · 27 of 48 positions shown · IV contrast (multihance)
Comparison: 04/02/2022 MRI cervical spine and CT cervical spine
04/16/2022

CLINICAL DATA: Weakness, numbness in hands, body, arms, and legs;
right shoulder pain and neck

EXAM:
MRI CERVICAL SPINE WITHOUT AND WITH CONTRAST
TECHNIQUE: Multiplanar and multiecho pulse sequences of the cervical spine, to
include the craniocervical junction and cervicothoracic junction,
were obtained without and with intravenous contrast.
CONTRAST:  18mL MULTIHANCE GADOBENATE DIMEGLUMINE 529 MG/ML IV SOLN

[Series 6: T1 · sagittal · 3.0mm · 0.66mm/px · 4 of 15 slices shown (1 of 3)]
[im 1/15]
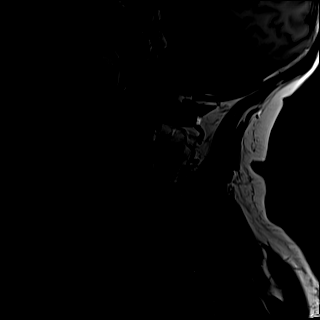
[im 5/15]
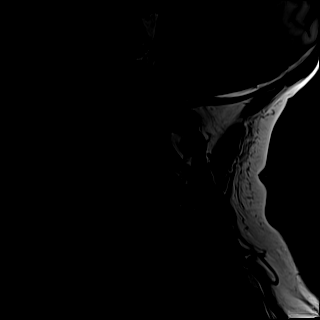
[im 10/15]
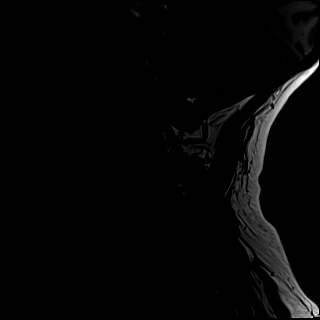
[im 15/15]
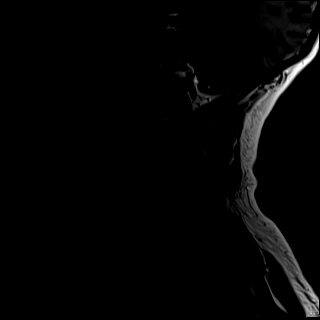

[Series 8: T2 · axial · 3.0mm · 0.62mm/px · z∈[-95,+0]mm · 8 of 36 slices shown (1 of 2)]
[im 1/36]
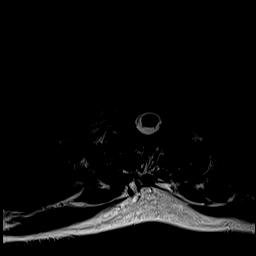
[im 6/36]
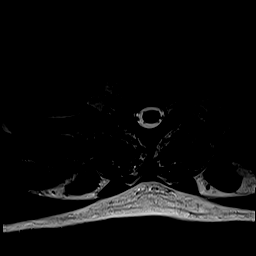
[im 11/36]
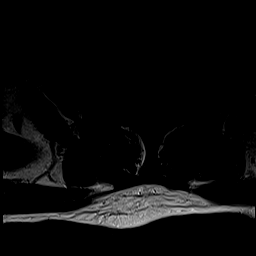
[im 16/36]
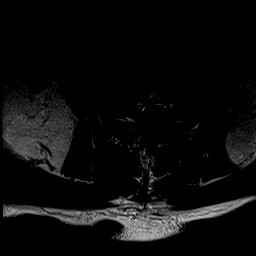
[im 21/36]
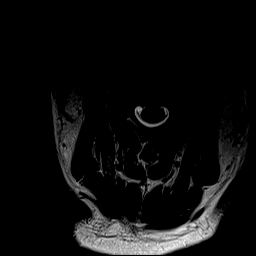
[im 26/36]
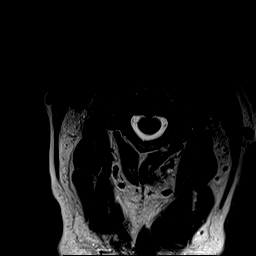
[im 31/36]
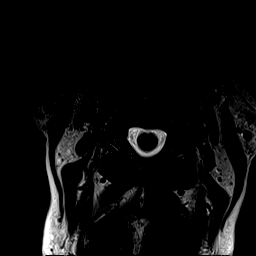
[im 36/36]
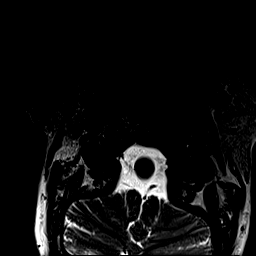

[Series 10: T1 · axial · non-contrast · 3.0mm · 0.31mm/px · z∈[-95,+0]mm · 8 of 36 slices shown (2 of 3)]
[im 1/36]
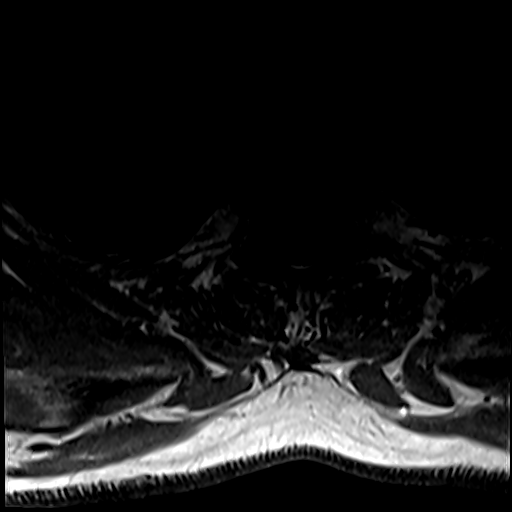
[im 6/36]
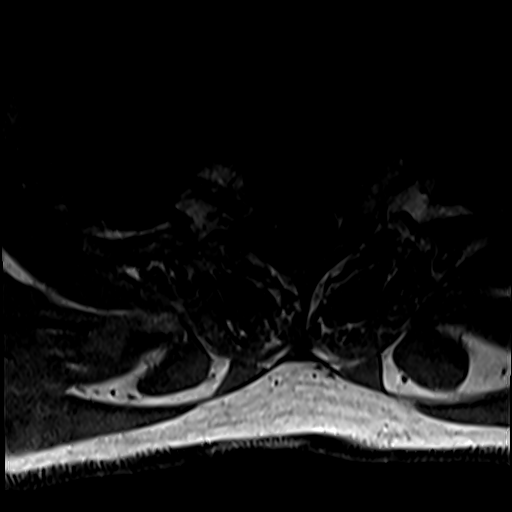
[im 11/36]
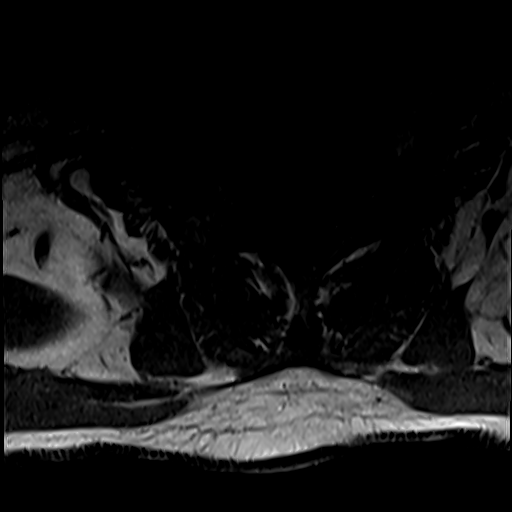
[im 16/36]
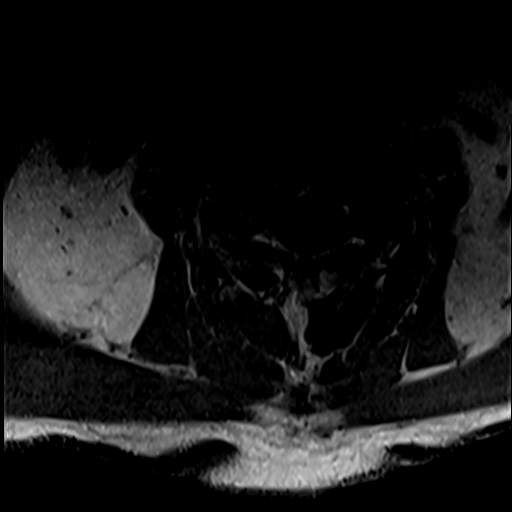
[im 21/36]
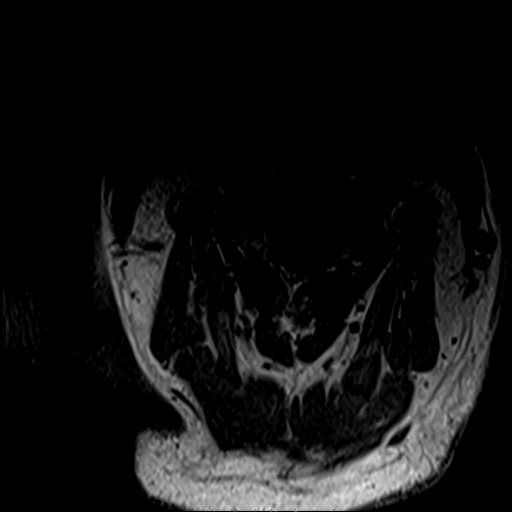
[im 26/36]
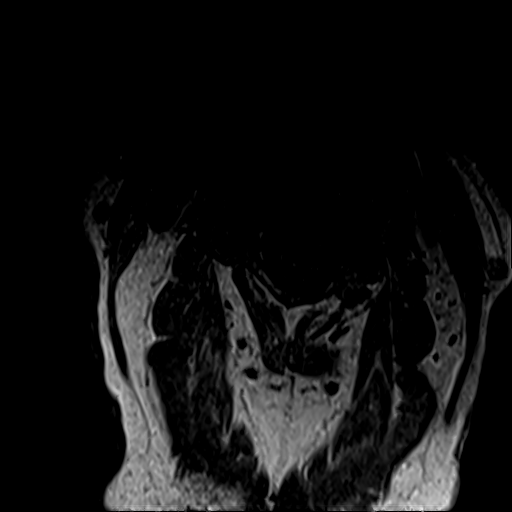
[im 31/36]
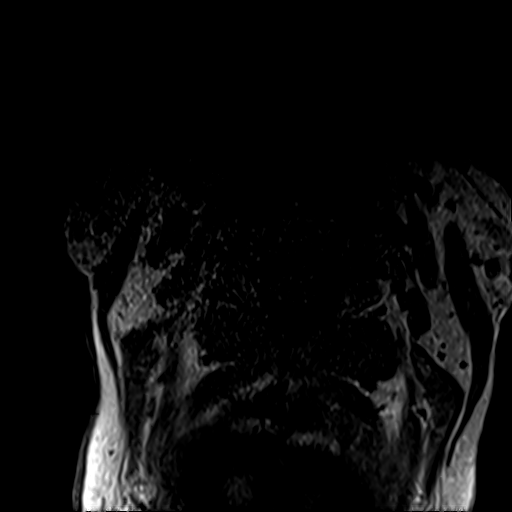
[im 36/36]
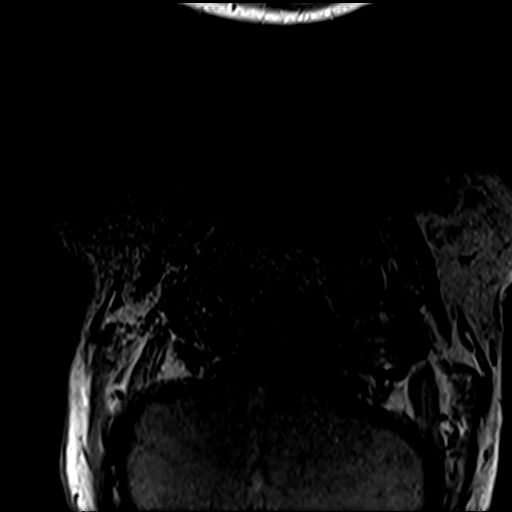

[Series 11: T2 · sagittal · 3.0mm · 0.55mm/px · 4 of 15 slices shown (2 of 2)]
[im 1/15]
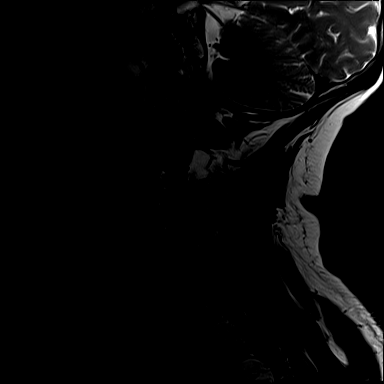
[im 5/15]
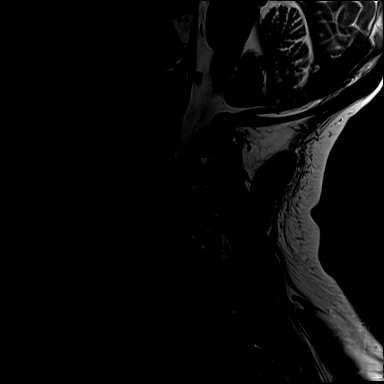
[im 10/15]
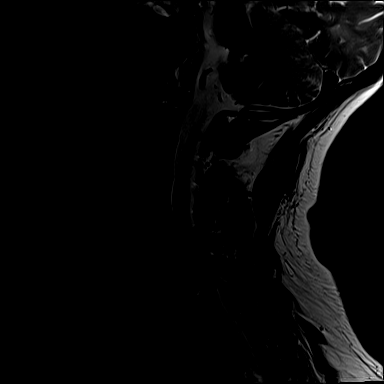
[im 15/15]
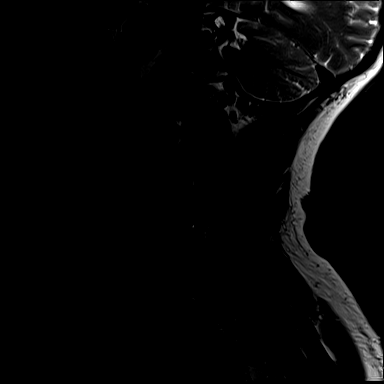

[Series 13: T1 · axial · 3.0mm · 0.31mm/px · z∈[-95,-68]mm · 3 of 36 slices shown (3 of 3)]
[im 1/36]
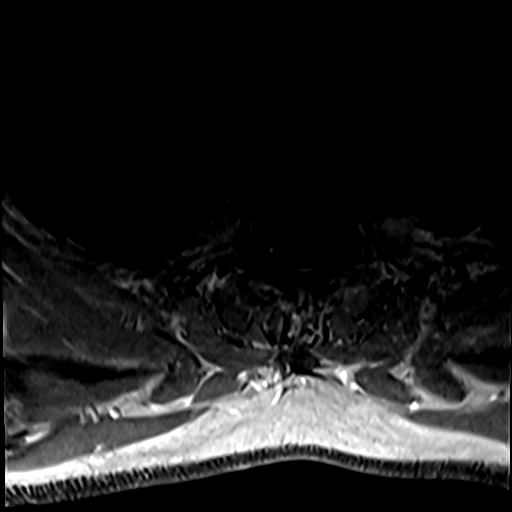
[im 6/36]
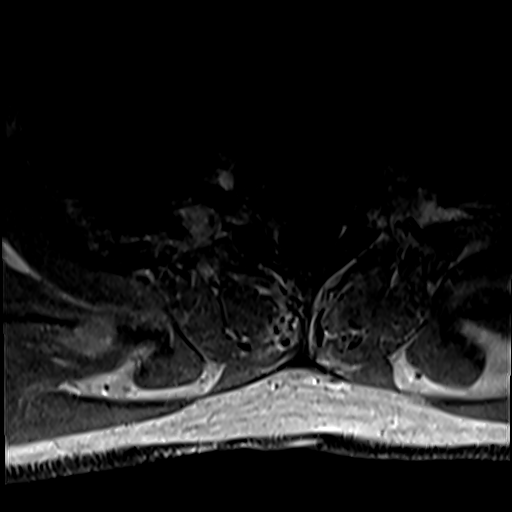
[im 11/36]
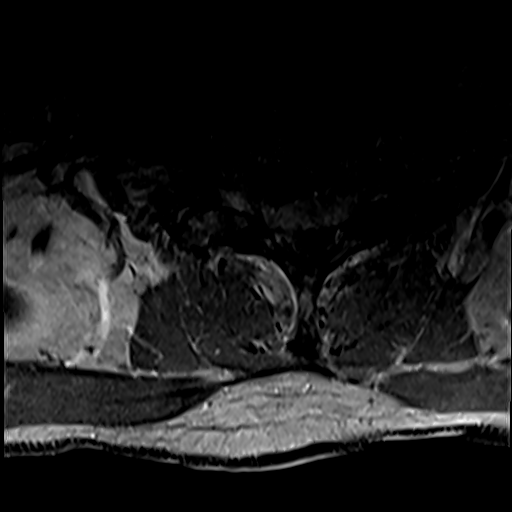

[27 of 48 positions shown; findings below may reference images not displayed]

FINDINGS: Alignment: Trace retrolisthesis C5 on C6.

Vertebrae: Redemonstrated complete height loss of C7, with a
posterior bulging component that extends approximately 6-7 mm into
the spinal canal, as seen on the prior exam and not significantly
changed. The remainder of the vertebral body and the posterior
aspect demonstrates increased T2 signal on STIR sequence and
contrast enhancement (series 14, image 9 and series 7, image 9)
abnormal signal extends into the bilateral posterior elements.
Abnormal signal also extends circumferentially around the C6-C7
level extending approximately 1.0 cm anterior to the vertebral body
(series 14, image 7) and up to 2 cm in either transverse direction
(series 13, image 25).

Abnormal signal is also noted to a lesser extent in the
posteroinferior right aspect of C4 (series 14, image 5), inferior
right facet of C5 (series 14, image 2), left posterior elements of
C6 (series 14, image 6), right lateral inferior aspect of T1 (series
14, image 5 and series 8, image 31), posterosuperior left aspect of
T2 (series 14, image 10), and the posterior elements of T3 (series
14, image 11).

Cord: Normal spinal cord signal. Epidural extension at C7 causes
severe spinal canal stenosis and compression of the spinal cord. No
abnormal spinal cord enhancement.

Posterior Fossa, vertebral arteries, paraspinal tissues: Possible
mass effect on the esophagus secondary to expansile tumor at C7.
Otherwise negative.

Disc levels:

C2-C3: No significant disc bulge. No spinal canal stenosis or
neuroforaminal narrowing.

C3-C4: No significant disc bulge. No spinal canal stenosis or
neuroforaminal narrowing.

C4-C5: No significant disc bulge. No spinal canal stenosis or
neuroforaminal narrowing.

C5-C6: Small right subarticular disc protrusion. Mild spinal canal
stenosis. No neural foraminal narrowing.

C6-C7: Severe spinal canal stenosis and severe bilateral neural
foraminal narrowing are again noted, secondary to extension of the
previously noted C7 tumor.

C7-T1: Severe spinal canal stenosis secondary to epidural extension
of tumor with less compression of the spinal cord than at C6-C7.
Similar severe bilateral neural foraminal stenosis.
IMPRESSION: 1. Redemonstrated C7 vertebra plana tumor, overall similar to the
prior exam, which extends into the bilateral posterior elements and
demonstrates significant circumferential tumor extension. This
causes severe spinal canal stenosis and compression of the spinal
cord at both C6-C7 and C7-T1, without evidence of abnormal signal or
enhancement in the spinal cord, as well as severe bilateral neural
foraminal narrowing.
2. Additional abnormal signal is seen to a lesser extent in C4, C5,
C6, T1, T2, and T3. Overall these findings are concerning for
multiple myeloma versus metastatic disease.

## 2023-11-07 NOTE — Telephone Encounter (Signed)
Oral Chemotherapy Pharmacist Encounter  I spoke with patient for overview of: Pomalyst (pomalidomide) for the treatment of R/R multiple myeloma in conjunction with carfilzomib, planned duration until disease progression or unacceptable drug toxicity.   Counseled patient on administration, dosing, side effects, monitoring, drug-food interactions, safe handling, storage, and disposal.  Patient will take Pomalyst 3mg  capsules, 1 capsule by mouth once daily, without regard to food, with a full glass of water.  Pomalyst will be given 21 days on, 7 days off, repeat every 28 days.  Pomalyst start date: 11/14/23  Adverse effects of Pomalyst include but are not limited to: GI upset, rash, fatigue, peripheral edema, and decreased blood counts.   VTE PPX: patient will start daily aspirin 81 mg for VTE PPX.   Reviewed with patient importance of keeping a medication schedule and plan for any missed doses. No barriers to medication adherence identified.  Medication reconciliation performed and medication/allergy list updated.   Insurance authorization for [employer] has been obtained.  Pomalyst prescription is being dispensed from [facility] as it is a limited distribution medication. Medication will be delivered to patient's home on 11/08/23.  All questions answered.  Barry Gray voiced understanding and appreciation.   Medication education handout placed in mail for patient. Patient knows to call the office with questions or concerns. Oral Chemotherapy Clinic phone number provided to patient.   Sherry Ruffing, PharmD, BCPS, BCOP Hematology/Oncology Clinical Pharmacist [facility] Oral Chemotherapy Navigation Clinics [phone] 11/07/2023 10:22 AM

## 2023-11-14 ENCOUNTER — Inpatient Hospital Stay: Payer: Medicare Other

## 2023-11-14 ENCOUNTER — Other Ambulatory Visit: Payer: Self-pay | Admitting: *Deleted

## 2023-11-14 ENCOUNTER — Telehealth: Payer: Self-pay

## 2023-11-14 ENCOUNTER — Other Ambulatory Visit: Payer: Self-pay | Admitting: Hematology & Oncology

## 2023-11-14 VITALS — BP 123/72 | HR 59 | Temp 98.6°F | Resp 19

## 2023-11-14 DIAGNOSIS — C9000 Multiple myeloma not having achieved remission: Secondary | ICD-10-CM

## 2023-11-14 DIAGNOSIS — Z5112 Encounter for antineoplastic immunotherapy: Secondary | ICD-10-CM | POA: Diagnosis not present

## 2023-11-14 DIAGNOSIS — R9431 Abnormal electrocardiogram [ECG] [EKG]: Secondary | ICD-10-CM

## 2023-11-14 DIAGNOSIS — G959 Disease of spinal cord, unspecified: Secondary | ICD-10-CM

## 2023-11-14 LAB — CBC WITH DIFFERENTIAL (CANCER CENTER ONLY)
Abs Immature Granulocytes: 0.03 10*3/uL (ref 0.00–0.07)
Basophils Absolute: 0 10*3/uL (ref 0.0–0.1)
Basophils Relative: 1 %
Eosinophils Absolute: 0.1 10*3/uL (ref 0.0–0.5)
Eosinophils Relative: 2 %
HCT: 34.6 % — ABNORMAL LOW (ref 39.0–52.0)
Hemoglobin: 11.4 g/dL — ABNORMAL LOW (ref 13.0–17.0)
Immature Granulocytes: 1 %
Lymphocytes Relative: 13 %
Lymphs Abs: 0.6 10*3/uL — ABNORMAL LOW (ref 0.7–4.0)
MCH: 32.3 pg (ref 26.0–34.0)
MCHC: 32.9 g/dL (ref 30.0–36.0)
MCV: 98 fL (ref 80.0–100.0)
Monocytes Absolute: 0.8 10*3/uL (ref 0.1–1.0)
Monocytes Relative: 15 %
Neutro Abs: 3.5 10*3/uL (ref 1.7–7.7)
Neutrophils Relative %: 68 %
Platelet Count: 211 10*3/uL (ref 150–400)
RBC: 3.53 MIL/uL — ABNORMAL LOW (ref 4.22–5.81)
RDW: 14.1 % (ref 11.5–15.5)
WBC Count: 5.1 10*3/uL (ref 4.0–10.5)
nRBC: 0 % (ref 0.0–0.2)

## 2023-11-14 LAB — CMP (CANCER CENTER ONLY)
ALT: 85 U/L — ABNORMAL HIGH (ref 0–44)
AST: 58 U/L — ABNORMAL HIGH (ref 15–41)
Albumin: 3.4 g/dL — ABNORMAL LOW (ref 3.5–5.0)
Alkaline Phosphatase: 416 U/L — ABNORMAL HIGH (ref 38–126)
Anion gap: 14 (ref 5–15)
BUN: 20 mg/dL (ref 8–23)
CO2: 22 mmol/L (ref 22–32)
Calcium: 9.9 mg/dL (ref 8.9–10.3)
Chloride: 103 mmol/L (ref 98–111)
Creatinine: 1.9 mg/dL — ABNORMAL HIGH (ref 0.61–1.24)
GFR, Estimated: 38 mL/min — ABNORMAL LOW (ref >60)
Glucose, Bld: 131 mg/dL — ABNORMAL HIGH (ref 70–99)
Potassium: 5.6 mmol/L — ABNORMAL HIGH (ref 3.5–5.1)
Sodium: 139 mmol/L (ref 135–145)
Total Bilirubin: 1.2 mg/dL — ABNORMAL HIGH (ref <1.2)
Total Protein: 8.3 g/dL — ABNORMAL HIGH (ref 6.5–8.1)

## 2023-11-14 MED ORDER — PROCHLORPERAZINE MALEATE 10 MG PO TABS
10.0000 mg | ORAL_TABLET | Freq: Once | ORAL | Status: AC
  Administered 2023-11-14: 10 mg via ORAL
  Filled 2023-11-14: qty 1

## 2023-11-14 MED ORDER — PROCHLORPERAZINE MALEATE 10 MG PO TABS: 10.0000 mg | ORAL_TABLET | Freq: Four times a day (QID) | ORAL | 1 refills | Status: DC | PRN

## 2023-11-14 MED ORDER — SODIUM CHLORIDE 0.9 % IV SOLN: Freq: Once | INTRAVENOUS | Status: AC

## 2023-11-14 MED ORDER — ACYCLOVIR 400 MG PO TABS: 400.0000 mg | ORAL_TABLET | Freq: Every day | ORAL | 3 refills | Status: DC

## 2023-11-14 MED ORDER — ACYCLOVIR 400 MG PO TABS: 400.0000 mg | ORAL_TABLET | Freq: Two times a day (BID) | ORAL | 3 refills | Status: DC

## 2023-11-14 MED ORDER — DEXAMETHASONE 4 MG PO TABS
4.0000 mg | ORAL_TABLET | Freq: Once | ORAL | Status: AC
  Administered 2023-11-14: 4 mg via ORAL
  Filled 2023-11-14: qty 1

## 2023-11-14 MED ORDER — LIDOCAINE-PRILOCAINE 2.5-2.5 % EX CREA: TOPICAL_CREAM | CUTANEOUS | 3 refills | Status: DC

## 2023-11-14 MED ORDER — CARFILZOMIB CHEMO INJECTION 60 MG
20.0000 mg/m2 | Freq: Once | INTRAVENOUS | Status: AC
  Administered 2023-11-14: 40 mg via INTRAVENOUS
  Filled 2023-11-14: qty 15

## 2023-11-14 MED ORDER — SODIUM CHLORIDE 0.9 % IV SOLN: INTRAVENOUS | Status: DC

## 2023-11-14 MED ORDER — ONDANSETRON HCL 8 MG PO TABS: 8.0000 mg | ORAL_TABLET | Freq: Three times a day (TID) | ORAL | 1 refills | Status: DC | PRN

## 2023-11-14 MED ORDER — DEXAMETHASONE 4 MG PO TABS: ORAL_TABLET | ORAL | 2 refills | Status: DC

## 2023-11-14 NOTE — Patient Instructions (Signed)
[facility] - A DEPT OF [facility]  Discharge Instructions: Thank you for choosing [facility] to provide your oncology and hematology care.   If you have a lab appointment with the Cancer Center, please go directly to the Cancer Center and check in at the registration area.  Wear comfortable clothing and clothing appropriate for easy access to any Portacath or PICC line.   We strive to give you quality time with your provider. You may need to reschedule your appointment if you arrive late (15 or more minutes).  Arriving late affects you and other patients whose appointments are after yours.  Also, if you miss three or more appointments without notifying the office, you may be dismissed from the clinic at the provider's discretion.      For prescription refill requests, have your pharmacy contact our office and allow 72 hours for refills to be completed.    Today you received the following chemotherapy and/or immunotherapy agents kyprolis      To help prevent nausea and vomiting after your treatment, we encourage you to take your nausea medication as directed.  BELOW ARE SYMPTOMS THAT SHOULD BE REPORTED IMMEDIATELY: *FEVER GREATER THAN 100.4 F (38 C) OR HIGHER *CHILLS OR SWEATING *NAUSEA AND VOMITING THAT IS NOT CONTROLLED WITH YOUR NAUSEA MEDICATION *UNUSUAL SHORTNESS OF BREATH *UNUSUAL BRUISING OR BLEEDING *URINARY PROBLEMS (pain or burning when urinating, or frequent urination) *BOWEL PROBLEMS (unusual diarrhea, constipation, pain near the anus) TENDERNESS IN MOUTH AND THROAT WITH OR WITHOUT PRESENCE OF ULCERS (sore throat, sores in mouth, or a toothache) UNUSUAL RASH, SWELLING OR PAIN  UNUSUAL VAGINAL DISCHARGE OR ITCHING   Items with * indicate a potential emergency and should be followed up as soon as possible or go to the Emergency Department if any problems should occur.  Please show the CHEMOTHERAPY ALERT CARD or IMMUNOTHERAPY  ALERT CARD at check-in to the Emergency Department and triage nurse. Should you have questions after your visit or need to cancel or reschedule your appointment, please contact [facility] - A DEPT OF [facility]  [phone] and follow the prompts.  Office hours are 8:00 a.m. to 4:30 p.m. Monday - Friday. Please note that voicemails left after 4:00 p.m. may not be returned until the following business day.  We are closed weekends and major holidays. You have access to a nurse at all times for urgent questions. Please call the main number to the clinic [phone] and follow the prompts.  For any non-urgent questions, you may also contact your provider using MyChart. We now offer e-Visits for anyone 71 and older to request care online for non-urgent symptoms. For details visit [email or web address].   Also download the MyChart app! Go to the app store, search "MyChart", open the app, select [place], and log in with your MyChart username and password.

## 2023-11-14 NOTE — Telephone Encounter (Signed)
This is to take one tablet, once a week for 3 weeks in a row and then to take one week off.  Cycle will start again the next month-take once a week (one tablet) for 3 weeks in a row and then one week off. Please call [phone] with any questions

## 2023-11-14 NOTE — Telephone Encounter (Signed)
Notified Patient of prior authorization approval for Lidocaine-Prilocaine 2.5% Cream. Medication is approved through 02/12/2024. No other needs or concerns voiced at this time.

## 2023-11-14 NOTE — Progress Notes (Signed)
Per Dr. Myna Hidalgo okay to treat w/ Kyprolis  today despite creatinine of 1.9

## 2023-11-14 NOTE — Progress Notes (Signed)
Ok to tx with AST=84

## 2023-11-15 ENCOUNTER — Inpatient Hospital Stay: Payer: Medicare Other

## 2023-11-15 ENCOUNTER — Telehealth: Payer: Self-pay | Admitting: *Deleted

## 2023-11-15 DIAGNOSIS — Z5112 Encounter for antineoplastic immunotherapy: Secondary | ICD-10-CM | POA: Diagnosis not present

## 2023-11-15 DIAGNOSIS — R9431 Abnormal electrocardiogram [ECG] [EKG]: Secondary | ICD-10-CM

## 2023-11-15 DIAGNOSIS — C9000 Multiple myeloma not having achieved remission: Secondary | ICD-10-CM

## 2023-11-15 DIAGNOSIS — G959 Disease of spinal cord, unspecified: Secondary | ICD-10-CM

## 2023-11-15 LAB — CMP (CANCER CENTER ONLY)
ALT: 100 U/L — ABNORMAL HIGH (ref 0–44)
AST: 75 U/L — ABNORMAL HIGH (ref 15–41)
Albumin: 3.2 g/dL — ABNORMAL LOW (ref 3.5–5.0)
Alkaline Phosphatase: 381 U/L — ABNORMAL HIGH (ref 38–126)
Anion gap: 12 (ref 5–15)
BUN: 24 mg/dL — ABNORMAL HIGH (ref 8–23)
CO2: 21 mmol/L — ABNORMAL LOW (ref 22–32)
Calcium: 9.1 mg/dL (ref 8.9–10.3)
Chloride: 105 mmol/L (ref 98–111)
Creatinine: 1.88 mg/dL — ABNORMAL HIGH (ref 0.61–1.24)
GFR, Estimated: 39 mL/min — ABNORMAL LOW (ref >60)
Glucose, Bld: 123 mg/dL — ABNORMAL HIGH (ref 70–99)
Potassium: 4.5 mmol/L (ref 3.5–5.1)
Sodium: 138 mmol/L (ref 135–145)
Total Bilirubin: 1.1 mg/dL (ref 0.0–1.2)
Total Protein: 8.2 g/dL — ABNORMAL HIGH (ref 6.5–8.1)

## 2023-11-15 NOTE — Telephone Encounter (Signed)
I called patient to inform him that his potassium level is 4.5. Dr. Myna Hidalgo is aware of the level. Continue taking the prune juice per Dr. Myna Hidalgo. Please call the office if you have any questions or concerns.

## 2023-11-21 ENCOUNTER — Inpatient Hospital Stay (HOSPITAL_BASED_OUTPATIENT_CLINIC_OR_DEPARTMENT_OTHER): Payer: Medicare Other | Admitting: Medical Oncology

## 2023-11-21 ENCOUNTER — Other Ambulatory Visit: Payer: Self-pay | Admitting: Radiology

## 2023-11-21 ENCOUNTER — Inpatient Hospital Stay: Payer: Medicare Other

## 2023-11-21 ENCOUNTER — Encounter: Payer: Self-pay | Admitting: Medical Oncology

## 2023-11-21 ENCOUNTER — Inpatient Hospital Stay: Payer: Medicare Other | Attending: Hematology & Oncology

## 2023-11-21 VITALS — BP 113/87 | HR 76 | Temp 97.7°F | Resp 17 | Ht 70.0 in | Wt 174.1 lb

## 2023-11-21 DIAGNOSIS — Z5112 Encounter for antineoplastic immunotherapy: Secondary | ICD-10-CM | POA: Insufficient documentation

## 2023-11-21 DIAGNOSIS — C9002 Multiple myeloma in relapse: Secondary | ICD-10-CM | POA: Insufficient documentation

## 2023-11-21 DIAGNOSIS — K769 Liver disease, unspecified: Secondary | ICD-10-CM

## 2023-11-21 DIAGNOSIS — L270 Generalized skin eruption due to drugs and medicaments taken internally: Secondary | ICD-10-CM

## 2023-11-21 DIAGNOSIS — R5383 Other fatigue: Secondary | ICD-10-CM

## 2023-11-21 DIAGNOSIS — C9000 Multiple myeloma not having achieved remission: Secondary | ICD-10-CM

## 2023-11-21 LAB — CBC WITH DIFFERENTIAL (CANCER CENTER ONLY)
Abs Immature Granulocytes: 0.06 10*3/uL (ref 0.00–0.07)
Basophils Absolute: 0 10*3/uL (ref 0.0–0.1)
Basophils Relative: 1 %
Eosinophils Absolute: 0.3 10*3/uL (ref 0.0–0.5)
Eosinophils Relative: 5 %
HCT: 32.8 % — ABNORMAL LOW (ref 39.0–52.0)
Hemoglobin: 10.7 g/dL — ABNORMAL LOW (ref 13.0–17.0)
Immature Granulocytes: 1 %
Lymphocytes Relative: 8 %
Lymphs Abs: 0.5 10*3/uL — ABNORMAL LOW (ref 0.7–4.0)
MCH: 32.1 pg (ref 26.0–34.0)
MCHC: 32.6 g/dL (ref 30.0–36.0)
MCV: 98.5 fL (ref 80.0–100.0)
Monocytes Absolute: 0.7 10*3/uL (ref 0.1–1.0)
Monocytes Relative: 11 %
Neutro Abs: 4.9 10*3/uL (ref 1.7–7.7)
Neutrophils Relative %: 74 %
Platelet Count: 218 10*3/uL (ref 150–400)
RBC: 3.33 MIL/uL — ABNORMAL LOW (ref 4.22–5.81)
RDW: 14.2 % (ref 11.5–15.5)
WBC Count: 6.5 10*3/uL (ref 4.0–10.5)
nRBC: 0 % (ref 0.0–0.2)

## 2023-11-21 LAB — PROTEIN ELECTROPHORESIS, SERUM, WITH REFLEX
A/G Ratio: 0.9 (ref 0.7–1.7)
Albumin ELP: 3.5 g/dL (ref 2.9–4.4)
Alpha-1-Globulin: 0.4 g/dL (ref 0.0–0.4)
Alpha-2-Globulin: 1.1 g/dL — ABNORMAL HIGH (ref 0.4–1.0)
Beta Globulin: 2.4 g/dL — ABNORMAL HIGH (ref 0.7–1.3)
Gamma Globulin: 0.2 g/dL — ABNORMAL LOW (ref 0.4–1.8)
Globulin, Total: 4.1 g/dL — ABNORMAL HIGH (ref 2.2–3.9)
M-Spike, %: 1.6 g/dL — ABNORMAL HIGH
SPEP Interpretation: 0
Total Protein ELP: 7.6 g/dL (ref 6.0–8.5)

## 2023-11-21 LAB — IMMUNOFIXATION REFLEX, SERUM
IgA: 1832 mg/dL — ABNORMAL HIGH (ref 61–437)
IgG (Immunoglobin G), Serum: 242 mg/dL — ABNORMAL LOW (ref 603–1613)
IgM (Immunoglobulin M), Srm: <5 mg/dL — ABNORMAL LOW (ref 20–172)

## 2023-11-21 LAB — CMP (CANCER CENTER ONLY)
ALT: 29 U/L (ref 0–44)
AST: 20 U/L (ref 15–41)
Albumin: 3.4 g/dL — ABNORMAL LOW (ref 3.5–5.0)
Alkaline Phosphatase: 259 U/L — ABNORMAL HIGH (ref 38–126)
Anion gap: 11 (ref 5–15)
BUN: 35 mg/dL — ABNORMAL HIGH (ref 8–23)
CO2: 21 mmol/L — ABNORMAL LOW (ref 22–32)
Calcium: 9.2 mg/dL (ref 8.9–10.3)
Chloride: 106 mmol/L (ref 98–111)
Creatinine: 1.63 mg/dL — ABNORMAL HIGH (ref 0.61–1.24)
GFR, Estimated: 46 mL/min — ABNORMAL LOW (ref >60)
Glucose, Bld: 154 mg/dL — ABNORMAL HIGH (ref 70–99)
Potassium: 5.7 mmol/L — ABNORMAL HIGH (ref 3.5–5.1)
Sodium: 138 mmol/L (ref 135–145)
Total Bilirubin: 1 mg/dL (ref 0.0–1.2)
Total Protein: 7.5 g/dL (ref 6.5–8.1)

## 2023-11-21 MED ORDER — CETIRIZINE HCL 10 MG PO TABS: 10.0000 mg | ORAL_TABLET | Freq: Every day | ORAL | 0 refills | Status: DC

## 2023-11-21 MED ORDER — FAMOTIDINE 20 MG PO TABS: 20.0000 mg | ORAL_TABLET | Freq: Two times a day (BID) | ORAL | 0 refills | Status: DC

## 2023-11-21 MED ORDER — ACYCLOVIR 400 MG PO TABS: 400.0000 mg | ORAL_TABLET | Freq: Two times a day (BID) | ORAL | 3 refills | Status: DC

## 2023-11-21 MED ORDER — METHYLPREDNISOLONE 4 MG PO TBPK: ORAL_TABLET | ORAL | 0 refills | Status: DC

## 2023-11-21 NOTE — Patient Instructions (Signed)
 Stop the Pomalyst altogether

## 2023-11-21 NOTE — Progress Notes (Signed)
 No treatment for pt today per order of S. Henagar PA.

## 2023-11-21 NOTE — Progress Notes (Signed)
 Pt to infusion room with reports of a raised red rash to bilateral axillae, lower abdomen down to bilateral mid thighs, groin and lower back.  Pt states that he stopped taking Pomalyst  on Friday, 11/18/23.  Pt also reports a fever of 102.0 each day around 3:00PM since last Monday.  Pt added to S. [place] PA's schedule to be seen.

## 2023-11-21 NOTE — Progress Notes (Signed)
 For this.  I think Hematology and Oncology Follow Up Visit  Barry Gray [id number] [birth year] 68 y.o. 11/21/2023   Principle Diagnosis:  IgA Kappa myeloma -- normal cytogenetics/FISH (-) --relapse  Current Therapy:   S/p cervical spine stabilization -- 04/28/2022 Faspro/Velcade /Decadron  -- s/p cycle #6-- start on 05/20/2022 Zometa  4 mg IV q 3 months -- d/c on 08/05/2023 due to jaw pain Status post autologous stem cell transplant -- Barry Gray on 12/03/2022 Faspro/Velcade  -- maintenance -  start on 04/28/2023 -Velcade  discontinued after 07/08/2023 Kyprolis /pomalidomide  --*on 11/14/2023     Interim History:  Barry Gray is back for consideration of cycle 1 day 8 of his recurrent myeloma.   Since his last visit he has had an itchy rash which is systemic. Noticed about 3 days after starting his Kyprolis /pomalidomide .  Rash is not painful. Worsening slowly over the past 3 days. No trouble breathing. No facial swelling of skin peeling. Has had GERD along with some non-bloody diarrhea. He also has had fevers daily at 3 pm. Reducing well with tylenol .   Appetite is down. Fatigue is present.   His last bone marrow biopsy was performed on 10/12/2023.  The pathology report ([id number]) showed only 2% plasma cells in the aspirate.  There was some interstitial infiltrates and variably sized aggregates of atypical plasma cells with kappa chain restriction.Of note, he had chromosomes and FISH panel on the aspirate.  All was normal.  His last PET scan was done on 10/21/2023.  This did show that he had activity in several bones and possibly his liver. Due to the rarity of this to occur he is being scheduled for a liver biopsy to confirm if the area of concern is in fact related to his myeloma.   Per [facility] at [facility] he would be a candidate for CAR-T therapy. For now he is on Kyprolis  and pomalidomide . His next visit is on 12/07/2023.   Initial ECHO is scheduled for 12/05/2023. He is considered lower risk  for CHF. He will be due for his next ECHO/BNP around his 3rd cycle.   He denies cough, SOB, peripheral edema, unexpected weight gain, fevers, abdominal pain, bleeding.   Overall, I would say that his performance status is probably ECOG 1.   Wt Readings from Last 3 Encounters:  11/21/23 174 lb 1.9 oz (79 kg)  10/31/23 176 lb (79.8 kg)  09/30/23 180 lb (81.6 kg)   Medications:  Current Outpatient Medications:    aspirin 81 MG chewable tablet, Chew 81 mg by mouth daily., Disp: , Rfl:    cetirizine  (ZYRTEC  ALLERGY) 10 MG tablet, Take 1 tablet (10 mg total) by mouth at bedtime., Disp: 14 tablet, Rfl: 0   dexamethasone  (DECADRON ) 4 MG tablet, Take one tablet (4mg ) prior to Kyprolis  treatments once a week times three weeks and then one week off, Disp: 30 tablet, Rfl: 2   famotidine  (PEPCID ) 20 MG tablet, Take 1 tablet (20 mg total) by mouth 2 (two) times daily., Disp: 30 tablet, Rfl: 0   lactulose  (CHRONULAC ) 10 GM/15ML solution, TAKE 15 MLS (10 G TOTAL) BY MOUTH 3 (THREE) TIMES DAILY. (Patient taking differently: Take 10 g by mouth daily as needed.), Disp: 946 mL, Rfl: 2   lidocaine -prilocaine  (EMLA ) cream, Apply 1 Application topically. Apply a dime size of cream to port-a-cath 1-2 hours prior to access. Cover with Barry Cools., Disp: , Rfl:    metFORMIN  (GLUCOPHAGE ) 500 MG tablet, Take 500 mg by mouth 2 (two) times daily with a meal., Disp: ,  Rfl:    methylPREDNISolone  (MEDROL  DOSEPAK) 4 MG TBPK tablet, Take Dose pack as instructed every morning with breakfast, Disp: 1 each, Rfl: 0   ondansetron  (ZOFRAN ) 8 MG tablet, Take 1 tablet (8 mg total) by mouth every 8 (eight) hours as needed for nausea or vomiting., Disp: 30 tablet, Rfl: 1   pomalidomide  (POMALYST ) 3 MG capsule, Take one capsule by mouth for 21 days and then off for 7 days., Disp: 21 capsule, Rfl: 0   prochlorperazine  (COMPAZINE ) 10 MG tablet, Take 1 tablet (10 mg total) by mouth every 6 (six) hours as needed for nausea or vomiting., Disp:  30 tablet, Rfl: 1   tamsulosin  (FLOMAX ) 0.4 MG CAPS capsule, Take 0.4 mg by mouth daily., Disp: , Rfl:    acyclovir  (ZOVIRAX ) 400 MG tablet, Take 1 tablet (400 mg total) by mouth 2 (two) times daily., Disp: 60 tablet, Rfl: 3  Allergies:  Allergies  Allergen Reactions   Revlimid  [Lenalidomide ] Rash    Past Medical History, Surgical history, Social history, and Family History were reviewed and updated.  Review of Systems: Review of Systems  Constitutional: Negative.   HENT:  Negative.    Eyes: Negative.   Respiratory: Negative.    Cardiovascular: Negative.   Gastrointestinal:  Positive for diarrhea.  Endocrine: Negative.   Genitourinary:  Negative for bladder incontinence and difficulty urinating.   Musculoskeletal:  Positive for neck pain.  Skin:  Positive for itching and rash.  Neurological: Negative.   Hematological: Negative.   Psychiatric/Behavioral: Negative.      Physical Exam: Vitals:   11/21/23 1107  BP: 113/87  Pulse: 76  Resp: 17  Temp: 97.7 F (36.5 C)  SpO2: 100%    Wt Readings from Last 3 Encounters:  11/21/23 174 lb 1.9 oz (79 kg)  10/31/23 176 lb (79.8 kg)  09/30/23 180 lb (81.6 kg)    Physical Exam Vitals reviewed.  HENT:     Head: Normocephalic and atraumatic.  Eyes:     Pupils: Pupils are equal, round, and reactive to light.  Cardiovascular:     Rate and Rhythm: Normal rate and regular rhythm.     Heart sounds: Normal heart sounds.  Pulmonary:     Effort: Pulmonary effort is normal.     Breath sounds: Normal breath sounds.  Abdominal:     General: Bowel sounds are normal.     Palpations: Abdomen is soft.  Musculoskeletal:        General: No tenderness or deformity. Normal range of motion.     Cervical back: Normal range of motion.  Lymphadenopathy:     Cervical: No cervical adenopathy.  Skin:    General: Skin is warm and dry.     Findings: Rash (systemic erythematic maculopapular rash. Most extensive around the lower torso) present.  No erythema.  Neurological:     Mental Status: He is alert and oriented to person, place, and time.  Psychiatric:        Behavior: Behavior normal.        Thought Content: Thought content normal.        Judgment: Judgment normal.      Lab Results  Component Value Date   WBC 6.5 11/21/2023   HGB 10.7 (L) 11/21/2023   HCT 32.8 (L) 11/21/2023   MCV 98.5 11/21/2023   PLT 218 11/21/2023     Chemistry      Component Value Date/Time   NA 138 11/21/2023 0939   K 5.7 (H) 11/21/2023 9060  CL 106 11/21/2023 0939   CO2 21 (L) 11/21/2023 0939   BUN 35 (H) 11/21/2023 0939   CREATININE 1.63 (H) 11/21/2023 0939      Component Value Date/Time   CALCIUM 9.2 11/21/2023 0939   ALKPHOS 259 (H) 11/21/2023 0939   AST 20 11/21/2023 0939   ALT 29 11/21/2023 0939   BILITOT 1.0 11/21/2023 9060     Encounter Diagnoses  Name Primary?   Other fatigue Yes   Drug rash     Impression and Plan: Mr. Umbach is a very nice 68 year old white male.  He has IgA kappa myeloma.  Most of his myeloma is Kappa light chain with recurrence. Currently he is S/p Cycle 1 Kyprolis /pomalidomide  (11/14/2023)  He has a new rash which is thought to be secondary to his Pomylist. He will hold this medication. He will not have tx today due to his rash- discussed with Dr. Timmy who agrees.   I will start him on zytrec, medrol , pepcid . Discussed how to use along with common potential side effects and precautions.  He will return in 1 week as planned for follow up and consideration of additional treatment with Kyprolis .    Lauraine CHRISTELLA Dais, PA-C 1/6/20254:15 PM

## 2023-11-22 NOTE — Progress Notes (Addendum)
 Referring Physician(s): Ennever,Peter R  Supervising Physician: Henn,A  Patient Status:  [facility]  Chief Complaint: I'm here for a liver biopsy   Subjective: Patient known to IR team from bone marrow biopsies on 06/15/22, 08/30/22 and 10/12/23.  He is a 68 year old male with past medical history significant for diabetes, renal insufficiency, nephrolithiasis and multiple myeloma 2023 with prior treatment/stem cell transplant. Recent PET scan on 10/21/2023 revealed: 1. Diffuse mildly hypermetabolic hepatic masses, most likely active myeloma. Metastatic disease from a second primary felt less likely. 2. Active osseous myeloma as detailed above. Other lesions have undergone interval healing given new sclerosis. 3. Right mandibular osseous hypermetabolism may be dental in origin. Consider physical exam correlation. 4. Incidental findings, including: Coronary artery atherosclerosis. Aortic Atherosclerosis (ICD10-I70.0). Prostatomegaly. Cholelithiasis.   He is scheduled today for image guided liver mass biopsy for further evaluation.  He currently denies fever, headache, chest pain, dyspnea, cough, abdominal/back pain, nausea, vomiting or bleeding.  He is recovering from systemic rash following chemotherapy.  Past Medical History:  Diagnosis Date   Diabetes mellitus    Fracture of fibula, distal 10/2011   w/ mild posterior displacement of distal fracture fragments   History of radiation therapy    C-Spine 06/17/22-0815/23-Dr. Lynwood Nasuti   Kidney stone    Multiple myeloma (HCC) 05/07/2022   Past Surgical History:  Procedure Laterality Date   ANTERIOR CERVICAL DECOMPRESSION/DISCECTOMY FUSION 4 LEVELS N/A 04/28/2022   Procedure: ANTERIOR CERVICAL DECOMPRESSION FUSION CERVICAL 5- CERVICAL 6, CERVICAL 6- CERVICAL 7, CERVICAL 7 - THORACIC 1 , Thoracic 1 - thoracic 2 WITH CERVICAL 7 CORPECTOMY WITH INSTRUMENTATION AND ALLOGRAFT;  Surgeon: Beuford Anes, MD;  Location: [facility] OR;  Service:  Orthopedics;  Laterality: N/A;   COLONOSCOPY     POSTERIOR CERVICAL FUSION/FORAMINOTOMY  04/28/2022   Procedure: POSTERIOR CERVICAL FUSION/FORAMINOTOMY LEVEL 3;  Surgeon: Beuford Anes, MD;  Location: [facility] OR;  Service: Orthopedics;;    Allergies: Revlimid  [lenalidomide ]  Medications: Prior to Admission medications   Medication Sig Start Date End Date Taking? Authorizing Provider  acyclovir  (ZOVIRAX ) 400 MG tablet Take 1 tablet (400 mg total) by mouth 2 (two) times daily. 11/21/23   Tonette Lauraine HERO, PA-C  aspirin 81 MG chewable tablet Chew 81 mg by mouth daily.    [provider]  cetirizine  (ZYRTEC  ALLERGY) 10 MG tablet Take 1 tablet (10 mg total) by mouth at bedtime. 11/21/23   Tonette Lauraine HERO, PA-C  dexamethasone  (DECADRON ) 4 MG tablet Take one tablet (4mg ) prior to Kyprolis  treatments once a week times three weeks and then one week off 11/14/23   Timmy Maude SAUNDERS, MD  famotidine  (PEPCID ) 20 MG tablet Take 1 tablet (20 mg total) by mouth 2 (two) times daily. 11/21/23   Tonette Lauraine HERO, PA-C  lactulose  (CHRONULAC ) 10 GM/15ML solution TAKE 15 MLS (10 G TOTAL) BY MOUTH 3 (THREE) TIMES DAILY. Patient taking differently: Take 10 g by mouth daily as needed. 08/16/23   Timmy Maude SAUNDERS, MD  lidocaine -prilocaine  (EMLA ) cream Apply 1 Application topically. Apply a dime size of cream to port-a-cath 1-2 hours prior to access. Cover with [employer].    [provider]  metFORMIN  (GLUCOPHAGE ) 500 MG tablet Take 500 mg by mouth 2 (two) times daily with a meal.    [provider]  methylPREDNISolone  (MEDROL  DOSEPAK) 4 MG TBPK tablet Take Dose pack as instructed every morning with breakfast 11/21/23   Tonette Lauraine HERO, PA-C  ondansetron  (ZOFRAN ) 8 MG tablet Take 1 tablet (8  mg total) by mouth every 8 (eight) hours as needed for nausea or vomiting. 11/14/23   Timmy Maude SAUNDERS, MD  pomalidomide  (POMALYST ) 3 MG capsule Take one capsule by mouth for 21 days and then off for 7 days.  11/02/23   Timmy Maude SAUNDERS, MD  prochlorperazine  (COMPAZINE ) 10 MG tablet Take 1 tablet (10 mg total) by mouth every 6 (six) hours as needed for nausea or vomiting. 11/14/23   Timmy Maude SAUNDERS, MD  tamsulosin  (FLOMAX ) 0.4 MG CAPS capsule Take 0.4 mg by mouth daily. 05/06/22   [provider]     Vital Signs: Vitals:   11/23/23 1103  BP: 130/71  Pulse: 70  Resp: 18  Temp: 98.5 F (36.9 C)  SpO2: 100%      Code Status: FULL CODE  Physical Exam: awake/alert; chest- CTA bilat; heart- RRR; abd-soft,+BS,NT; no sig LE edema. Some residual patches of maculopapular skin rash noted on torso, abd/groin region  Imaging: No results found.  Labs:  CBC: Recent Labs    10/12/23 0823 10/31/23 0753 11/14/23 0832 11/21/23 0939  WBC 5.4 5.2 5.1 6.5  HGB 12.6* 12.1* 11.4* 10.7*  HCT 39.0 36.7* 34.6* 32.8*  PLT 180 205 211 218    COAGS: Recent Labs    10/12/23 0823  INR 1.0    BMP: Recent Labs    10/31/23 0753 11/14/23 0832 11/15/23 0925 11/21/23 0939  NA 138 139 138 138  K 4.7 5.6* 4.5 5.7*  CL 103 103 105 106  CO2 24 22 21* 21*  GLUCOSE 126* 131* 123* 154*  BUN 15 20 24* 35*  CALCIUM 9.6 9.9 9.1 9.2  CREATININE 1.62* 1.90* 1.88* 1.63*  GFRNONAA 46* 38* 39* 46*    LIVER FUNCTION TESTS: Recent Labs    10/31/23 0753 11/14/23 0832 11/15/23 0925 11/21/23 0939  BILITOT 0.9 1.2* 1.1 1.0  AST 70* 58* 75* 20  ALT 102* 85* 100* 29  ALKPHOS 295* 416* 381* 259*  PROT 8.1 8.3* 8.2* 7.5  ALBUMIN  4.0 3.4* 3.2* 3.4*    Assessment and Plan: 68 year old male with past medical history significant for diabetes, renal insufficiency, nephrolithiasis and multiple myeloma 2023 with prior treatment/stem cell transplant. Recent PET scan on 10/21/2023 revealed: 1. Diffuse mildly hypermetabolic hepatic masses, most likely active myeloma. Metastatic disease from a second primary felt less likely. 2. Active osseous myeloma as detailed above. Other lesions have undergone  interval healing given new sclerosis. 3. Right mandibular osseous hypermetabolism may be dental in origin. Consider physical exam correlation. 4. Incidental findings, including: Coronary artery atherosclerosis. Aortic Atherosclerosis (ICD10-I70.0). Prostatomegaly. Cholelithiasis.   He is scheduled today for image guided liver mass biopsy for further evaluation.Risks and benefits of procedure was discussed with the patient /spouse including, but not limited to bleeding, infection, damage to adjacent structures or low yield requiring additional tests.  All of the questions were answered and there is agreement to proceed.  Consent signed and in chart.  LABS PENDING  Electronically Signed: D. Franky Rakers, PA-C 11/22/2023, 3:52 PM   I spent a total of 25 Minutes at the the patient's bedside AND on the patient's hospital floor or unit, greater than 50% of which was counseling/coordinating care for image guided liver mass biopsy

## 2023-11-23 ENCOUNTER — Encounter (HOSPITAL_COMMUNITY): Payer: Self-pay

## 2023-11-23 ENCOUNTER — Ambulatory Visit (HOSPITAL_COMMUNITY)
Admission: RE | Admit: 2023-11-23 | Discharge: 2023-11-23 | Disposition: A | Discharge status: Home / Self Care | Payer: Medicare Other | Source: Ambulatory Visit | Attending: Hematology & Oncology | Admitting: Hematology & Oncology

## 2023-11-23 ENCOUNTER — Ambulatory Visit (HOSPITAL_COMMUNITY)
Admission: RE | Admit: 2023-11-23 | Discharge: 2023-11-23 | Disposition: A | Discharge status: Home / Self Care | Payer: Medicare Other | Source: Ambulatory Visit | Attending: Nurse Practitioner | Admitting: Nurse Practitioner

## 2023-11-23 ENCOUNTER — Encounter: Payer: Self-pay | Admitting: Hematology & Oncology

## 2023-11-23 ENCOUNTER — Other Ambulatory Visit: Payer: Self-pay

## 2023-11-23 VITALS — BP 116/65 | HR 69 | Temp 99.1°F | Resp 12 | Ht 70.0 in | Wt 174.1 lb

## 2023-11-23 DIAGNOSIS — I700 Atherosclerosis of aorta: Secondary | ICD-10-CM | POA: Diagnosis not present

## 2023-11-23 DIAGNOSIS — I2510 Atherosclerotic heart disease of native coronary artery without angina pectoris: Secondary | ICD-10-CM | POA: Insufficient documentation

## 2023-11-23 DIAGNOSIS — K8020 Calculus of gallbladder without cholecystitis without obstruction: Secondary | ICD-10-CM | POA: Insufficient documentation

## 2023-11-23 DIAGNOSIS — R21 Rash and other nonspecific skin eruption: Secondary | ICD-10-CM | POA: Insufficient documentation

## 2023-11-23 DIAGNOSIS — K769 Liver disease, unspecified: Secondary | ICD-10-CM | POA: Diagnosis not present

## 2023-11-23 DIAGNOSIS — C9000 Multiple myeloma not having achieved remission: Secondary | ICD-10-CM | POA: Diagnosis present

## 2023-11-23 DIAGNOSIS — Z7984 Long term (current) use of oral hypoglycemic drugs: Secondary | ICD-10-CM | POA: Insufficient documentation

## 2023-11-23 DIAGNOSIS — N400 Enlarged prostate without lower urinary tract symptoms: Secondary | ICD-10-CM | POA: Insufficient documentation

## 2023-11-23 DIAGNOSIS — Z87442 Personal history of urinary calculi: Secondary | ICD-10-CM | POA: Diagnosis not present

## 2023-11-23 DIAGNOSIS — E119 Type 2 diabetes mellitus without complications: Secondary | ICD-10-CM | POA: Diagnosis not present

## 2023-11-23 DIAGNOSIS — Z9484 Stem cells transplant status: Secondary | ICD-10-CM | POA: Diagnosis not present

## 2023-11-23 DIAGNOSIS — R160 Hepatomegaly, not elsewhere classified: Secondary | ICD-10-CM

## 2023-11-23 LAB — COMPREHENSIVE METABOLIC PANEL
ALT: 59 U/L — ABNORMAL HIGH (ref 0–44)
AST: 44 U/L — ABNORMAL HIGH (ref 15–41)
Albumin: 2.8 g/dL — ABNORMAL LOW (ref 3.5–5.0)
Alkaline Phosphatase: 234 U/L — ABNORMAL HIGH (ref 38–126)
Anion gap: 9 (ref 5–15)
BUN: 35 mg/dL — ABNORMAL HIGH (ref 8–23)
CO2: 19 mmol/L — ABNORMAL LOW (ref 22–32)
Calcium: 8.9 mg/dL (ref 8.9–10.3)
Chloride: 110 mmol/L (ref 98–111)
Creatinine, Ser: 1.48 mg/dL — ABNORMAL HIGH (ref 0.61–1.24)
GFR, Estimated: 52 mL/min — ABNORMAL LOW (ref >60)
Glucose, Bld: 128 mg/dL — ABNORMAL HIGH (ref 70–99)
Potassium: 5.1 mmol/L (ref 3.5–5.1)
Sodium: 138 mmol/L (ref 135–145)
Total Bilirubin: 0.6 mg/dL (ref 0.0–1.2)
Total Protein: 7.2 g/dL (ref 6.5–8.1)

## 2023-11-23 LAB — CBC WITH DIFFERENTIAL/PLATELET
Abs Immature Granulocytes: 0.03 10*3/uL (ref 0.00–0.07)
Basophils Absolute: 0 10*3/uL (ref 0.0–0.1)
Basophils Relative: 0 %
Eosinophils Absolute: 0.3 10*3/uL (ref 0.0–0.5)
Eosinophils Relative: 4 %
HCT: 33.8 % — ABNORMAL LOW (ref 39.0–52.0)
Hemoglobin: 10.6 g/dL — ABNORMAL LOW (ref 13.0–17.0)
Immature Granulocytes: 0 %
Lymphocytes Relative: 7 %
Lymphs Abs: 0.5 10*3/uL — ABNORMAL LOW (ref 0.7–4.0)
MCH: 31.5 pg (ref 26.0–34.0)
MCHC: 31.4 g/dL (ref 30.0–36.0)
MCV: 100.6 fL — ABNORMAL HIGH (ref 80.0–100.0)
Monocytes Absolute: 1.3 10*3/uL — ABNORMAL HIGH (ref 0.1–1.0)
Monocytes Relative: 17 %
Neutro Abs: 5.1 10*3/uL (ref 1.7–7.7)
Neutrophils Relative %: 72 %
Platelets: 238 10*3/uL (ref 150–400)
RBC: 3.36 MIL/uL — ABNORMAL LOW (ref 4.22–5.81)
RDW: 14.2 % (ref 11.5–15.5)
WBC: 7.2 10*3/uL (ref 4.0–10.5)
nRBC: 0 % (ref 0.0–0.2)

## 2023-11-23 LAB — PROTIME-INR
INR: 1.1 (ref 0.8–1.2)
Prothrombin Time: 14.3 s (ref 11.4–15.2)

## 2023-11-23 LAB — GLUCOSE, CAPILLARY: Glucose-Capillary: 132 mg/dL — ABNORMAL HIGH (ref 70–99)

## 2023-11-23 MED ORDER — HYDROCODONE-ACETAMINOPHEN 5-325 MG PO TABS: 1.0000 | ORAL_TABLET | ORAL | Status: DC | PRN

## 2023-11-23 MED ORDER — FENTANYL CITRATE (PF) 100 MCG/2ML IJ SOLN
INTRAMUSCULAR | Status: AC | PRN
  Administered 2023-11-23: 50 ug via INTRAVENOUS

## 2023-11-23 MED ORDER — MIDAZOLAM HCL 2 MG/2ML IJ SOLN
INTRAMUSCULAR | Status: AC | PRN
  Administered 2023-11-23: 1 mg via INTRAVENOUS

## 2023-11-23 MED ORDER — LIDOCAINE HCL 1 % IJ SOLN
INTRAMUSCULAR | Status: AC
  Filled 2023-11-23: qty 20

## 2023-11-23 MED ORDER — GELATIN ABSORBABLE 12-7 MM EX MISC
CUTANEOUS | Status: AC
Start: 2023-11-23 — End: ?
  Filled 2023-11-23: qty 1

## 2023-11-23 MED ORDER — MIDAZOLAM HCL 2 MG/2ML IJ SOLN
INTRAMUSCULAR | Status: AC | PRN
  Administered 2023-11-23: 2 mg via INTRAVENOUS

## 2023-11-23 MED ORDER — SODIUM CHLORIDE 0.9 % IV SOLN: INTRAVENOUS | Status: DC

## 2023-11-23 MED ORDER — MIDAZOLAM HCL 2 MG/2ML IJ SOLN
INTRAMUSCULAR | Status: AC
Start: 2023-11-23 — End: ?
  Filled 2023-11-23: qty 4

## 2023-11-23 MED ORDER — FENTANYL CITRATE (PF) 100 MCG/2ML IJ SOLN
INTRAMUSCULAR | Status: AC
  Filled 2023-11-23: qty 2

## 2023-11-23 NOTE — Procedures (Signed)
 Interventional Radiology Procedure:   Indications: Myeloma and liver lesions  Procedure: US  guided core biopsy of right hepatic lesion  Findings: Innumerable small liver lesions.  3 core biopsies from a right hepatic lesion.   Complications: None     EBL: Minimal  Plan: Bedrest 3 hours  Antara Brecheisen R. Philip, MD  Pager: [phone]

## 2023-11-23 NOTE — Discharge Instructions (Signed)
 Please call Interventional Radiology clinic [phone] with any questions or concerns.   You may remove your dressing and shower tomorrow.   Liver Biopsy, Care After After a liver biopsy, it is common to have these things in the area where the biopsy was done. You may: Have pain. Feel sore. Have bruising. You may also feel tired for a few days. Follow these instructions at home: Medicines Take over-the-counter and prescription medicines only as told by your doctor. If you were prescribed an antibiotic medicine, take it as told by your doctor. Do not stop taking the antibiotic, even if you start to feel better. Do not take medicines that may thin your blood. These medicines include aspirin and ibuprofen. Take them only if your doctor tells you to. If told, take steps to prevent problems with pooping (constipation). You may need to: Drink enough fluid to keep your pee (urine) pale yellow. Take medicines. You will be told what medicines to take. Eat foods that are high in fiber. These include beans, whole grains, and fresh fruits and vegetables. Limit foods that are high in fat and sugar. These include fried or sweet foods. Ask your doctor if you should avoid driving or using machines while you are taking your medicine. Caring for your incision Follow instructions from your doctor about how to take care of your cut from surgery (incisions). Make sure you: Wash your hands with soap and water for at least 20 seconds before and after you change your bandage. If you cannot use soap and water, use hand sanitizer. Change your bandage. Leavestitches or skin glue in place for at least two weeks. Leave tape strips alone unless you are told to take them off. You may trim the edges of the tape strips if they curl up. Check your incision every day for signs of infection. Check for: Redness, swelling, or more pain. Fluid or blood. Warmth. Pus or a bad smell. Do not take baths, swim, or use a  hot tub. Ask your doctor about taking showers or sponge baths. Activity Rest at home for 1-2 days, or as told by your doctor. Get up to take short walks every 1 to 2 hours. Ask for help if you feel weak or unsteady. Do not lift anything that is heavier than 10 lb (4.5 kg), or the limit that you are told. Do not play contact sports for 2 weeks after the procedure. Return to your normal activities as told by your doctor. Ask what activities are safe for you. General instructions  Do not drink alcohol in the first week after the procedure. Plan to have a responsible adult care for you for the time you are told after you leave the hospital or clinic. This is important. It is up to you to get the results of your procedure. Ask how to get your results when they are ready. Keep all follow-up visits.   Contact a doctor if: You have more bleeding in your incision. Your incision swells, or is red and more painful. You have fluid that comes from your incision. You develop a rash. You have fever or chills. Get help right away if: You have swelling, bloating, or pain in your belly (abdomen). You get dizzy or faint. You vomit or you feel like vomiting. You have trouble breathing or feel short of breath. You have chest pain. You have problems talking or seeing. You have trouble with your balance or moving your arms or legs. These symptoms may be an emergency. Get help  right away. Call your local emergency services (911 in the [place] Do not wait to see if the symptoms will go away. Do not drive yourself to the hospital. Summary After the procedure, it is common to have pain, soreness, bruising, and tiredness. Your doctor will tell you how to take care of yourself at home. Change your bandage, take your medicines, and limit your activities as told by your doctor. Call your doctor if you have symptoms of infection. Get help right away if your belly swells, your cut bleeds a lot, or you have trouble  talking or breathing. This information is not intended to replace advice given to you by your health care provider. Make sure you discuss any questions you have with your healthcare provider. Document Revised: 09/15/2020 Document Reviewed: 09/15/2020 Elsevier Patient Education  2022 [employer]     Moderate Conscious Sedation, Adult, Care After This sheet gives you information about how to care for yourself after your procedure. Your health care provider may also give you more specific instructions. If you have problems or questions, contact your health care provider. What can I expect after the procedure? After the procedure, it is common to have: Sleepiness for several hours. Impaired judgment for several hours. Difficulty with balance. Vomiting if you eat too soon. Follow these instructions at home: For the time period you were told by your health care provider: Rest. Do not participate in activities where you could fall or become injured. Do not drive or use machinery. Do not drink alcohol. Do not take sleeping pills or medicines that cause drowsiness. Do not make important decisions or sign legal documents. Do not take care of children on your own.        Eating and drinking Follow the diet recommended by your health care provider. Drink enough fluid to keep your urine pale yellow. If you vomit: Drink water, juice, or soup when you can drink without vomiting. Make sure you have little or no nausea before eating solid foods.    General instructions Take over-the-counter and prescription medicines only as told by your health care provider. Have a responsible adult stay with you for the time you are told. It is important to have someone help care for you until you are awake and alert. Do not smoke. Keep all follow-up visits as told by your health care provider. This is important. Contact a health care provider if: You are still sleepy or having trouble with balance after  24 hours. You feel light-headed. You keep feeling nauseous or you keep vomiting. You develop a rash. You have a fever. You have redness or swelling around the IV site. Get help right away if: You have trouble breathing. You have new-onset confusion at home. Summary After the procedure, it is common to feel sleepy, have impaired judgment, or feel nauseous if you eat too soon. Rest after you get home. Know the things you should not do after the procedure. Follow the diet recommended by your health care provider and drink enough fluid to keep your urine pale yellow. Get help right away if you have trouble breathing or new-onset confusion at home. This information is not intended to replace advice given to you by your health care provider. Make sure you discuss any questions you have with your health care provider. Document Revised: 02/29/2020 Document Reviewed: 09/27/2019 Elsevier Patient Education  2021 [employer].

## 2023-11-25 LAB — SURGICAL PATHOLOGY

## 2023-11-28 ENCOUNTER — Ambulatory Visit (HOSPITAL_COMMUNITY): Payer: Medicare Other

## 2023-11-28 ENCOUNTER — Inpatient Hospital Stay: Payer: Medicare Other

## 2023-11-28 ENCOUNTER — Inpatient Hospital Stay (HOSPITAL_BASED_OUTPATIENT_CLINIC_OR_DEPARTMENT_OTHER): Payer: Medicare Other | Admitting: Hematology & Oncology

## 2023-11-28 ENCOUNTER — Encounter: Payer: Self-pay | Admitting: Hematology & Oncology

## 2023-11-28 VITALS — BP 116/60 | HR 70 | Temp 97.8°F | Resp 18 | Ht 70.0 in | Wt 171.1 lb

## 2023-11-28 DIAGNOSIS — C9002 Multiple myeloma in relapse: Secondary | ICD-10-CM | POA: Diagnosis not present

## 2023-11-28 DIAGNOSIS — C9000 Multiple myeloma not having achieved remission: Secondary | ICD-10-CM

## 2023-11-28 DIAGNOSIS — R9431 Abnormal electrocardiogram [ECG] [EKG]: Secondary | ICD-10-CM

## 2023-11-28 DIAGNOSIS — Z5112 Encounter for antineoplastic immunotherapy: Secondary | ICD-10-CM | POA: Diagnosis not present

## 2023-11-28 LAB — CBC WITH DIFFERENTIAL (CANCER CENTER ONLY)
Abs Immature Granulocytes: 0.07 10*3/uL (ref 0.00–0.07)
Basophils Absolute: 0.1 10*3/uL (ref 0.0–0.1)
Basophils Relative: 1 %
Eosinophils Absolute: 0.7 10*3/uL — ABNORMAL HIGH (ref 0.0–0.5)
Eosinophils Relative: 10 %
HCT: 33.6 % — ABNORMAL LOW (ref 39.0–52.0)
Hemoglobin: 10.9 g/dL — ABNORMAL LOW (ref 13.0–17.0)
Immature Granulocytes: 1 %
Lymphocytes Relative: 12 %
Lymphs Abs: 0.8 10*3/uL (ref 0.7–4.0)
MCH: 31.7 pg (ref 26.0–34.0)
MCHC: 32.4 g/dL (ref 30.0–36.0)
MCV: 97.7 fL (ref 80.0–100.0)
Monocytes Absolute: 1.4 10*3/uL — ABNORMAL HIGH (ref 0.1–1.0)
Monocytes Relative: 21 %
Neutro Abs: 3.7 10*3/uL (ref 1.7–7.7)
Neutrophils Relative %: 55 %
Platelet Count: 329 10*3/uL (ref 150–400)
RBC: 3.44 MIL/uL — ABNORMAL LOW (ref 4.22–5.81)
RDW: 13.8 % (ref 11.5–15.5)
WBC Count: 6.7 10*3/uL (ref 4.0–10.5)
nRBC: 0 % (ref 0.0–0.2)

## 2023-11-28 LAB — LACTATE DEHYDROGENASE: LDH: 122 U/L (ref 98–192)

## 2023-11-28 LAB — CMP (CANCER CENTER ONLY)
ALT: 31 U/L (ref 0–44)
AST: 18 U/L (ref 15–41)
Albumin: 3.5 g/dL (ref 3.5–5.0)
Alkaline Phosphatase: 200 U/L — ABNORMAL HIGH (ref 38–126)
Anion gap: 10 (ref 5–15)
BUN: 19 mg/dL (ref 8–23)
CO2: 22 mmol/L (ref 22–32)
Calcium: 8.6 mg/dL — ABNORMAL LOW (ref 8.9–10.3)
Chloride: 108 mmol/L (ref 98–111)
Creatinine: 1.28 mg/dL — ABNORMAL HIGH (ref 0.61–1.24)
GFR, Estimated: >60 mL/min (ref >60)
Glucose, Bld: 125 mg/dL — ABNORMAL HIGH (ref 70–99)
Potassium: 4.4 mmol/L (ref 3.5–5.1)
Sodium: 140 mmol/L (ref 135–145)
Total Bilirubin: 0.6 mg/dL (ref 0.0–1.2)
Total Protein: 6.6 g/dL (ref 6.5–8.1)

## 2023-11-28 NOTE — Progress Notes (Signed)
 For this.  I think Hematology and Oncology Follow Up Visit  Barry Gray [id number] [birth year] 68 y.o. 11/28/2023   Principle Diagnosis:  IgA Kappa myeloma -- normal cytogenetics/FISH (-) --relapse  Current Therapy:   S/p cervical spine stabilization -- 04/28/2022 Faspro/Velcade /Decadron  -- s/p cycle #6-- start on 05/20/2022 Zometa  4 mg IV q 3 months -- d/c on 08/05/2023 due to jaw pain Status post autologous stem cell transplant -- [facility] on 12/03/2022 Faspro/Velcade  -- maintenance -  start on 04/28/2023 -Velcade  discontinued after 07/08/2023 Kyprolis /pomalidomide  --*on 11/14/2023 --pomalidomide  DC on 11/28/2023     Interim History:  Barry Gray is back for follow-up.  Unfortunately, he does have a hard time with the pomalidomide .  He developed this rash.  It is still present but improving.  I really think is going to have to stop the pomalidomide .  He had a liver biopsy that was done.  This was done a couple weeks ago.  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  I really think this is a indication as to how the biology of his cancer has changed.  I spoke with Dr. Darra at [facility] about the biopsy results.  He will have Barry Gray be seen I think next week and hopefully see about treatment with CAR-T or maybe a bispecific antibody.  I just feel bad that Barry Gray has developed his myeloma with a relapse of aggressively.  He is not eating as much.  His appetite is down a little bit.  He has had no cough.  He has had no bleeding.  He has had no diarrhea.  Overall, I do not think there is much in the way of pain.  Currently, I would say that his performance status is probably ECOG 1.    Wt Readings from Last 3 Encounters:  11/28/23 171 lb 1.9 oz (77.6 kg)  11/23/23 174 lb 1.9 oz (79 kg)  11/21/23 174 lb 1.9 oz (79 kg)   Medications:  Current Outpatient Medications:    acyclovir  (ZOVIRAX ) 400 MG tablet, Take 1 tablet (400 mg total) by mouth 2  (two) times daily., Disp: 60 tablet, Rfl: 3   aspirin 81 MG chewable tablet, Chew 81 mg by mouth daily., Disp: , Rfl:    cetirizine  (ZYRTEC  ALLERGY) 10 MG tablet, Take 1 tablet (10 mg total) by mouth at bedtime., Disp: 14 tablet, Rfl: 0   dexamethasone  (DECADRON ) 4 MG tablet, Take one tablet (4mg ) prior to Kyprolis  treatments once a week times three weeks and then one week off, Disp: 30 tablet, Rfl: 2   famotidine  (PEPCID ) 20 MG tablet, Take 1 tablet (20 mg total) by mouth 2 (two) times daily., Disp: 30 tablet, Rfl: 0   lactulose  (CHRONULAC ) 10 GM/15ML solution, TAKE 15 MLS (10 G TOTAL) BY MOUTH 3 (THREE) TIMES DAILY. (Patient taking differently: Take 10 g by mouth daily as needed.), Disp: 946 mL, Rfl: 2   lidocaine -prilocaine  (EMLA ) cream, Apply 1 Application topically. Apply a dime size of cream to port-a-cath 1-2 hours prior to access. Cover with [employer]., Disp: , Rfl:    metFORMIN  (GLUCOPHAGE ) 500 MG tablet, Take 500 mg by mouth 2 (two) times daily with a meal., Disp: , Rfl:    ondansetron  (ZOFRAN ) 8 MG tablet, Take 1 tablet (8 mg total) by mouth every 8 (eight) hours as needed for nausea or vomiting., Disp: 30 tablet, Rfl: 1   pomalidomide  (POMALYST ) 3 MG capsule, Take one capsule by mouth for 21 days and then off  for 7 days., Disp: 21 capsule, Rfl: 0   prochlorperazine  (COMPAZINE ) 10 MG tablet, Take 1 tablet (10 mg total) by mouth every 6 (six) hours as needed for nausea or vomiting., Disp: 30 tablet, Rfl: 1   tamsulosin  (FLOMAX ) 0.4 MG CAPS capsule, Take 0.4 mg by mouth daily., Disp: , Rfl:   Allergies:  Allergies  Allergen Reactions   Revlimid  [Lenalidomide ] Rash    Past Medical History, Surgical history, Social history, and Family History were reviewed and updated.  Review of Systems: Review of Systems  Constitutional: Negative.   HENT:  Negative.    Eyes: Negative.   Respiratory: Negative.    Cardiovascular: Negative.   Gastrointestinal:  Positive for diarrhea.  Endocrine:  Negative.   Genitourinary:  Negative for bladder incontinence and difficulty urinating.   Musculoskeletal:  Positive for neck pain.  Skin:  Positive for itching and rash.  Neurological: Negative.   Hematological: Negative.   Psychiatric/Behavioral: Negative.      Physical Exam: Vitals:   11/28/23 0943  BP: 116/60  Pulse: 70  Resp: 18  Temp: 97.8 F (36.6 C)  SpO2: 100%    Wt Readings from Last 3 Encounters:  11/28/23 171 lb 1.9 oz (77.6 kg)  11/23/23 174 lb 1.9 oz (79 kg)  11/21/23 174 lb 1.9 oz (79 kg)    Physical Exam Vitals reviewed.  HENT:     Head: Normocephalic and atraumatic.  Eyes:     Pupils: Pupils are equal, round, and reactive to light.  Cardiovascular:     Rate and Rhythm: Normal rate and regular rhythm.     Heart sounds: Normal heart sounds.  Pulmonary:     Effort: Pulmonary effort is normal.     Breath sounds: Normal breath sounds.  Abdominal:     General: Bowel sounds are normal.     Palpations: Abdomen is soft.  Musculoskeletal:        General: No tenderness or deformity. Normal range of motion.     Cervical back: Normal range of motion.  Lymphadenopathy:     Cervical: No cervical adenopathy.  Skin:    General: Skin is warm and dry.     Findings: Rash (systemic erythematic maculopapular rash. Most extensive around the lower torso) present. No erythema.  Neurological:     Mental Status: He is alert and oriented to person, place, and time.  Psychiatric:        Behavior: Behavior normal.        Thought Content: Thought content normal.        Judgment: Judgment normal.     Lab Results  Component Value Date   WBC 6.7 11/28/2023   HGB 10.9 (L) 11/28/2023   HCT 33.6 (L) 11/28/2023   MCV 97.7 11/28/2023   PLT 329 11/28/2023     Chemistry      Component Value Date/Time   NA 140 11/28/2023 0911   K 4.4 11/28/2023 0911   CL 108 11/28/2023 0911   CO2 22 11/28/2023 0911   BUN 19 11/28/2023 0911   CREATININE 1.28 (H) 11/28/2023 0911       Component Value Date/Time   CALCIUM 8.6 (L) 11/28/2023 0911   ALKPHOS 200 (H) 11/28/2023 0911   AST 18 11/28/2023 0911   ALT 31 11/28/2023 0911   BILITOT 0.6 11/28/2023 0911       Impression and Plan: Mr. Fraizer is a very nice 68 year old white male.  He has IgA kappa myeloma.  Most of his myeloma is Kappa light  chain with recurrence.  He underwent upfront chemotherapy and then underwent transplant.  He had his transplant a year ago.  He then relapsed late last year.  Clearly, his relapse is aggressive.  We are going to stop the pomalidomide  right now.  There will besee what his numbers look like with respect to the light chain.  I will give him a couple weeks off for right now.  I think he needs this to try to improve his overall quality of life and performance status.  He sees [facility] next week.  Will be interesting to see what they have to say as to how they can utilize CAR-T therapy.     Maude JONELLE Crease, MD 1/13/202510:51 AM

## 2023-11-29 ENCOUNTER — Other Ambulatory Visit: Payer: Self-pay

## 2023-11-29 LAB — KAPPA/LAMBDA LIGHT CHAINS
Kappa free light chain: 76.8 mg/L — ABNORMAL HIGH (ref 3.3–19.4)
Kappa, lambda light chain ratio: 24.77 — ABNORMAL HIGH (ref 0.26–1.65)
Lambda free light chains: 3.1 mg/L — ABNORMAL LOW (ref 5.7–26.3)

## 2023-11-30 ENCOUNTER — Encounter: Payer: Self-pay | Admitting: *Deleted

## 2023-11-30 ENCOUNTER — Other Ambulatory Visit: Payer: Self-pay | Admitting: *Deleted

## 2023-11-30 DIAGNOSIS — C9000 Multiple myeloma not having achieved remission: Secondary | ICD-10-CM

## 2023-11-30 LAB — IGG, IGA, IGM
IgA: 790 mg/dL — ABNORMAL HIGH (ref 61–437)
IgG (Immunoglobin G), Serum: 193 mg/dL — ABNORMAL LOW (ref 603–1613)
IgM (Immunoglobulin M), Srm: 7 mg/dL — ABNORMAL LOW (ref 20–172)

## 2023-12-02 ENCOUNTER — Encounter: Payer: Self-pay | Admitting: Hematology & Oncology

## 2023-12-05 ENCOUNTER — Other Ambulatory Visit: Payer: Self-pay

## 2023-12-05 ENCOUNTER — Other Ambulatory Visit: Payer: Self-pay | Admitting: Hematology & Oncology

## 2023-12-05 ENCOUNTER — Ambulatory Visit (HOSPITAL_COMMUNITY)
Admission: RE | Admit: 2023-12-05 | Discharge: 2023-12-05 | Disposition: A | Discharge status: Home / Self Care | Payer: Medicare Other | Source: Ambulatory Visit | Attending: Hematology & Oncology | Admitting: Hematology & Oncology

## 2023-12-05 DIAGNOSIS — R9431 Abnormal electrocardiogram [ECG] [EKG]: Secondary | ICD-10-CM | POA: Diagnosis not present

## 2023-12-05 DIAGNOSIS — I3481 Nonrheumatic mitral (valve) annulus calcification: Secondary | ICD-10-CM | POA: Diagnosis not present

## 2023-12-05 DIAGNOSIS — C9000 Multiple myeloma not having achieved remission: Secondary | ICD-10-CM | POA: Diagnosis not present

## 2023-12-05 LAB — ECHOCARDIOGRAM COMPLETE
Area-P 1/2: 3.07 cm2
Calc EF: 54.4 %
S' Lateral: 3.2 cm
Single Plane A2C EF: 53.8 %
Single Plane A4C EF: 56.5 %

## 2023-12-05 MED ORDER — LACTULOSE 10 GM/15ML PO SOLN: 10.0000 g | Freq: Three times a day (TID) | ORAL | 2 refills | Status: DC

## 2023-12-08 LAB — PROTEIN ELECTROPHORESIS, SERUM, WITH REFLEX
A/G Ratio: 1 (ref 0.7–1.7)
Albumin ELP: 3.1 g/dL (ref 2.9–4.4)
Alpha-1-Globulin: 0.3 g/dL (ref 0.0–0.4)
Alpha-2-Globulin: 1.1 g/dL — ABNORMAL HIGH (ref 0.4–1.0)
Beta Globulin: 1.6 g/dL — ABNORMAL HIGH (ref 0.7–1.3)
Gamma Globulin: 0.2 g/dL — ABNORMAL LOW (ref 0.4–1.8)
Globulin, Total: 3.2 g/dL (ref 2.2–3.9)
M-Spike, %: 0.9 g/dL — ABNORMAL HIGH
SPEP Interpretation: 0
Total Protein ELP: 6.3 g/dL (ref 6.0–8.5)

## 2023-12-08 LAB — IMMUNOFIXATION REFLEX, SERUM
IgA: 1040 mg/dL — ABNORMAL HIGH (ref 61–437)
IgG (Immunoglobin G), Serum: 222 mg/dL — ABNORMAL LOW (ref 603–1613)
IgM (Immunoglobulin M), Srm: 8 mg/dL — ABNORMAL LOW (ref 20–172)

## 2023-12-12 ENCOUNTER — Inpatient Hospital Stay: Payer: Medicare Other

## 2023-12-12 ENCOUNTER — Encounter: Payer: Self-pay | Admitting: Hematology & Oncology

## 2023-12-12 ENCOUNTER — Other Ambulatory Visit: Payer: Self-pay

## 2023-12-12 ENCOUNTER — Inpatient Hospital Stay (HOSPITAL_BASED_OUTPATIENT_CLINIC_OR_DEPARTMENT_OTHER): Payer: Medicare Other | Admitting: Hematology & Oncology

## 2023-12-12 VITALS — BP 136/62 | HR 63 | Temp 98.2°F | Resp 18 | Ht 70.0 in | Wt 176.0 lb

## 2023-12-12 DIAGNOSIS — Z5112 Encounter for antineoplastic immunotherapy: Secondary | ICD-10-CM | POA: Diagnosis not present

## 2023-12-12 DIAGNOSIS — C9000 Multiple myeloma not having achieved remission: Secondary | ICD-10-CM | POA: Diagnosis not present

## 2023-12-12 DIAGNOSIS — C9002 Multiple myeloma in relapse: Secondary | ICD-10-CM

## 2023-12-12 LAB — CBC WITH DIFFERENTIAL (CANCER CENTER ONLY)
Abs Immature Granulocytes: 0.03 10*3/uL (ref 0.00–0.07)
Basophils Absolute: 0.1 10*3/uL (ref 0.0–0.1)
Basophils Relative: 1 %
Eosinophils Absolute: 0.2 10*3/uL (ref 0.0–0.5)
Eosinophils Relative: 3 %
HCT: 35.2 % — ABNORMAL LOW (ref 39.0–52.0)
Hemoglobin: 11.3 g/dL — ABNORMAL LOW (ref 13.0–17.0)
Immature Granulocytes: 1 %
Lymphocytes Relative: 17 %
Lymphs Abs: 1 10*3/uL (ref 0.7–4.0)
MCH: 31.4 pg (ref 26.0–34.0)
MCHC: 32.1 g/dL (ref 30.0–36.0)
MCV: 97.8 fL (ref 80.0–100.0)
Monocytes Absolute: 0.5 10*3/uL (ref 0.1–1.0)
Monocytes Relative: 9 %
Neutro Abs: 4.1 10*3/uL (ref 1.7–7.7)
Neutrophils Relative %: 69 %
Platelet Count: 198 10*3/uL (ref 150–400)
RBC: 3.6 MIL/uL — ABNORMAL LOW (ref 4.22–5.81)
RDW: 14 % (ref 11.5–15.5)
WBC Count: 5.9 10*3/uL (ref 4.0–10.5)
nRBC: 0 % (ref 0.0–0.2)

## 2023-12-12 LAB — CMP (CANCER CENTER ONLY)
ALT: 11 U/L (ref 0–44)
AST: 14 U/L — ABNORMAL LOW (ref 15–41)
Albumin: 4.1 g/dL (ref 3.5–5.0)
Alkaline Phosphatase: 312 U/L — ABNORMAL HIGH (ref 38–126)
Anion gap: 10 (ref 5–15)
BUN: 13 mg/dL (ref 8–23)
CO2: 24 mmol/L (ref 22–32)
Calcium: 8.6 mg/dL — ABNORMAL LOW (ref 8.9–10.3)
Chloride: 109 mmol/L (ref 98–111)
Creatinine: 1.27 mg/dL — ABNORMAL HIGH (ref 0.61–1.24)
GFR, Estimated: >60 mL/min (ref >60)
Glucose, Bld: 104 mg/dL — ABNORMAL HIGH (ref 70–99)
Potassium: 5.1 mmol/L (ref 3.5–5.1)
Sodium: 143 mmol/L (ref 135–145)
Total Bilirubin: 0.5 mg/dL (ref 0.0–1.2)
Total Protein: 6.1 g/dL — ABNORMAL LOW (ref 6.5–8.1)

## 2023-12-12 LAB — LACTATE DEHYDROGENASE: LDH: 113 U/L (ref 98–192)

## 2023-12-12 MED ORDER — POMALIDOMIDE 2 MG PO CAPS: 2.0000 mg | ORAL_CAPSULE | Freq: Every day | ORAL | 0 refills | Status: DC

## 2023-12-12 MED ORDER — HEPARIN SOD (PORK) LOCK FLUSH 100 UNIT/ML IV SOLN: 500.0000 [IU] | Freq: Once | INTRAVENOUS | Status: DC | PRN

## 2023-12-12 MED ORDER — PROCHLORPERAZINE MALEATE 10 MG PO TABS
10.0000 mg | ORAL_TABLET | Freq: Once | ORAL | Status: AC
  Administered 2023-12-12: 10 mg via ORAL
  Filled 2023-12-12: qty 1

## 2023-12-12 MED ORDER — SODIUM CHLORIDE 0.9 % IV SOLN: Freq: Once | INTRAVENOUS | Status: AC

## 2023-12-12 MED ORDER — SODIUM CHLORIDE 0.9 % IV SOLN: INTRAVENOUS | Status: DC

## 2023-12-12 MED ORDER — DEXTROSE 5 % IV SOLN
27.0000 mg/m2 | Freq: Once | INTRAVENOUS | Status: AC
  Administered 2023-12-12: 50 mg via INTRAVENOUS
  Filled 2023-12-12: qty 15

## 2023-12-12 MED ORDER — SODIUM CHLORIDE 0.9% FLUSH
10.0000 mL | INTRAVENOUS | Status: DC | PRN
Start: 2023-12-12 — End: 2023-12-12

## 2023-12-12 NOTE — Patient Instructions (Signed)
[facility]  Discharge Instructions: Thank you for choosing [facility] to provide your oncology and hematology care.   If you have a lab appointment with the Cancer Center, please go directly to the Cancer Center and check in at the registration area.  Wear comfortable clothing and clothing appropriate for easy access to any Portacath or PICC line.   We strive to give you quality time with your provider. You may need to reschedule your appointment if you arrive late (15 or more minutes).  Arriving late affects you and other patients whose appointments are after yours.  Also, if you miss three or more appointments without notifying the office, you may be dismissed from the clinic at the provider's discretion.      For prescription refill requests, have your pharmacy contact our office and allow 72 hours for refills to be completed.    Today you received the following chemotherapy and/or immunotherapy agents CARFILZOMIB      To help prevent nausea and vomiting after your treatment, we encourage you to take your nausea medication as directed.  BELOW ARE SYMPTOMS THAT SHOULD BE REPORTED IMMEDIATELY: *FEVER GREATER THAN 100.4 F (38 C) OR HIGHER *CHILLS OR SWEATING *NAUSEA AND VOMITING THAT IS NOT CONTROLLED WITH YOUR NAUSEA MEDICATION *UNUSUAL SHORTNESS OF BREATH *UNUSUAL BRUISING OR BLEEDING *URINARY PROBLEMS (pain or burning when urinating, or frequent urination) *BOWEL PROBLEMS (unusual diarrhea, constipation, pain near the anus) TENDERNESS IN MOUTH AND THROAT WITH OR WITHOUT PRESENCE OF ULCERS (sore throat, sores in mouth, or a toothache) UNUSUAL RASH, SWELLING OR PAIN  UNUSUAL VAGINAL DISCHARGE OR ITCHING   Items with * indicate a potential emergency and should be followed up as soon as possible or go to the Emergency Department if any problems should occur.  Please show the CHEMOTHERAPY ALERT CARD or IMMUNOTHERAPY  ALERT CARD at check-in to the Emergency Department and triage nurse. Should you have questions after your visit or need to cancel or reschedule your appointment, please contact [facility] [facility] - A DEPT OF [facility]  [phone] and follow the prompts.  Office hours are 8:00 a.m. to 4:30 p.m. Monday - Friday. Please note that voicemails left after 4:00 p.m. may not be returned until the following business day.  We are closed weekends and major holidays. You have access to a nurse at all times for urgent questions. Please call the main number to the clinic [phone] and follow the prompts.  For any non-urgent questions, you may also contact your provider using MyChart. We now offer e-Visits for anyone 15 and older to request care online for non-urgent symptoms. For details visit [email or web address].   Also download the MyChart app! Go to the app store, search "MyChart", open the app, select [place], and log in with your MyChart username and password.

## 2023-12-12 NOTE — Progress Notes (Signed)
For this.  I think Hematology and Oncology Follow Up Visit  AMRO WINEBARGER [id number] 01-19-1956 68 y.o. 12/12/2023   Principle Diagnosis:  IgA Kappa myeloma -- normal cytogenetics/FISH (-) --relapse  Current Therapy:   S/p cervical spine stabilization -- 04/28/2022 Faspro/Velcade/Decadron -- s/p cycle #6-- start on 05/20/2022 Zometa 4 mg IV q 3 months -- d/c on 08/05/2023 due to jaw pain Status post autologous stem cell transplant -- [facility] on 12/03/2022 Faspro/Velcade -- maintenance -  start on 04/28/2023 -Velcade discontinued after 07/08/2023 Kyprolis/pomalidomide --status post cycle #1 -- start on 11/14/2023 --pomalidomide DC on 11/28/2023  -restarted at 2 mg p.o. daily on 12/12/2023     Interim History:  Mr. Kisling is back for follow-up.  He has responded incredibly well to treatment.  He did have that reaction on the skin which I think is from the pomalidomide.  I will decrease the pomalidomide dose to 2 mg a day.  His monoclonal spike went down to 0.9 g/dL.  His IgA level went down to 1040 mg/dL.  Most importantly was at the Kappa light chain went down to 5.9 mg/dL.  He did see Dr. Jacqulyn Bath at [facility].  He is going to be set up for CAR-T therapy.  It sounds like this might take another 6-8 weeks.  He feels well.  He is eating well.  He had a good weekend.  He has had no problem with bowels or bladder.  He has had no problems with nausea or vomiting.  He has had no fever.  He has had no cough.  Overall, I would say that his performance status is probably ECOG 1.     Wt Readings from Last 3 Encounters:  11/28/23 171 lb 1.9 oz (77.6 kg)  11/23/23 174 lb 1.9 oz (79 kg)  11/21/23 174 lb 1.9 oz (79 kg)   Medications:  Current Outpatient Medications:    levothyroxine (SYNTHROID) 75 MCG tablet, Take 75 mcg by mouth daily before breakfast., Disp: , Rfl:    acyclovir (ZOVIRAX) 400 MG tablet, Take 1 tablet (400 mg total) by mouth 2 (two) times daily., Disp: 60 tablet, Rfl: 3   aspirin 81  MG chewable tablet, Chew 81 mg by mouth daily., Disp: , Rfl:    cetirizine (ZYRTEC ALLERGY) 10 MG tablet, Take 1 tablet (10 mg total) by mouth at bedtime., Disp: 14 tablet, Rfl: 0   dexamethasone (DECADRON) 4 MG tablet, Take one tablet (4mg ) prior to Kyprolis treatments once a week times three weeks and then one week off, Disp: 30 tablet, Rfl: 2   famotidine (PEPCID) 20 MG tablet, Take 1 tablet (20 mg total) by mouth 2 (two) times daily., Disp: 30 tablet, Rfl: 0   lactulose (CHRONULAC) 10 GM/15ML solution, Take 15 mLs (10 g total) by mouth 3 (three) times daily., Disp: 946 mL, Rfl: 2   lidocaine-prilocaine (EMLA) cream, Apply 1 Application topically. Apply a dime size of cream to port-a-cath 1-2 hours prior to access. Cover with [employer]., Disp: , Rfl:    metFORMIN (GLUCOPHAGE) 500 MG tablet, Take 500 mg by mouth 2 (two) times daily with a meal., Disp: , Rfl:    ondansetron (ZOFRAN) 8 MG tablet, Take 1 tablet (8 mg total) by mouth every 8 (eight) hours as needed for nausea or vomiting., Disp: 30 tablet, Rfl: 1   prochlorperazine (COMPAZINE) 10 MG tablet, Take 1 tablet (10 mg total) by mouth every 6 (six) hours as needed for nausea or vomiting., Disp: 30 tablet, Rfl: 1  tamsulosin (FLOMAX) 0.4 MG CAPS capsule, Take 0.4 mg by mouth daily., Disp: , Rfl:   Allergies:  Allergies  Allergen Reactions   Revlimid [Lenalidomide] Rash    Past Medical History, Surgical history, Social history, and Family History were reviewed and updated.  Review of Systems: Review of Systems  Constitutional: Negative.   HENT:  Negative.    Eyes: Negative.   Respiratory: Negative.    Cardiovascular: Negative.   Gastrointestinal:  Positive for diarrhea.  Endocrine: Negative.   Genitourinary:  Negative for bladder incontinence and difficulty urinating.   Musculoskeletal:  Positive for neck pain.  Skin:  Positive for itching and rash.  Neurological: Negative.   Hematological: Negative.   Psychiatric/Behavioral:  Negative.      Physical Exam:  Vital signs show temperature of 98.2.  Pulse 63.  Blood pressure 136/62.  Weight is 176 pounds.   Wt Readings from Last 3 Encounters:  11/28/23 171 lb 1.9 oz (77.6 kg)  11/23/23 174 lb 1.9 oz (79 kg)  11/21/23 174 lb 1.9 oz (79 kg)    Physical Exam Vitals reviewed.  HENT:     Head: Normocephalic and atraumatic.  Eyes:     Pupils: Pupils are equal, round, and reactive to light.  Cardiovascular:     Rate and Rhythm: Normal rate and regular rhythm.     Heart sounds: Normal heart sounds.  Pulmonary:     Effort: Pulmonary effort is normal.     Breath sounds: Normal breath sounds.  Abdominal:     General: Bowel sounds are normal.     Palpations: Abdomen is soft.  Musculoskeletal:        General: No tenderness or deformity. Normal range of motion.     Cervical back: Normal range of motion.  Lymphadenopathy:     Cervical: No cervical adenopathy.  Skin:    General: Skin is warm and dry.     Findings: Rash (systemic erythematic maculopapular rash. Most extensive around the lower torso) present. No erythema.  Neurological:     Mental Status: He is alert and oriented to person, place, and time.  Psychiatric:        Behavior: Behavior normal.        Thought Content: Thought content normal.        Judgment: Judgment normal.     Lab Results  Component Value Date   WBC 5.9 12/12/2023   HGB 11.3 (L) 12/12/2023   HCT 35.2 (L) 12/12/2023   MCV 97.8 12/12/2023   PLT 198 12/12/2023     Chemistry      Component Value Date/Time   NA 140 11/28/2023 0911   K 4.4 11/28/2023 0911   CL 108 11/28/2023 0911   CO2 22 11/28/2023 0911   BUN 19 11/28/2023 0911   CREATININE 1.28 (H) 11/28/2023 0911      Component Value Date/Time   CALCIUM 8.6 (L) 11/28/2023 0911   ALKPHOS 200 (H) 11/28/2023 0911   AST 18 11/28/2023 0911   ALT 31 11/28/2023 0911   BILITOT 0.6 11/28/2023 0911       Impression and Plan: Mr. Bubb is a very nice 68 year old white  male.  He has IgA kappa myeloma.  Most of his myeloma is Kappa light chain with recurrence.  He underwent upfront chemotherapy and then underwent transplant.  He had his transplant a year ago.  He then relapsed late last year.  Hopefully, with the lower dose of pomalidomide, he will not have a reaction.  I still believe  that the pomalidomide is a source of his skin issue.  I am glad that he is going to be worked up for CAR-T therapy.  I am sure that [facility] will let us know when the CAR-T therapy will be ready for him.  We will plan to see him back ourselves in another 3 or 4 weeks.  If he has another issue with his skin from treatment, I am sure he will let us know.   Josph Macho, MD 1/27/20259:08 AM

## 2023-12-12 NOTE — Addendum Note (Signed)
Addended by: Josph Macho on: 12/12/2023 03:15 PM   Modules accepted: Orders

## 2023-12-12 NOTE — Progress Notes (Signed)
Change Kyprolis to 27mg /m2 per Dr Myna Hidalgo.

## 2023-12-13 ENCOUNTER — Other Ambulatory Visit: Payer: Self-pay

## 2023-12-13 ENCOUNTER — Other Ambulatory Visit: Payer: Self-pay | Admitting: *Deleted

## 2023-12-13 MED ORDER — POMALIDOMIDE 2 MG PO CAPS: 2.0000 mg | ORAL_CAPSULE | Freq: Every day | ORAL | 0 refills | Status: DC

## 2023-12-14 ENCOUNTER — Other Ambulatory Visit: Payer: Self-pay

## 2023-12-14 LAB — KAPPA/LAMBDA LIGHT CHAINS
Kappa free light chain: 34.4 mg/L — ABNORMAL HIGH (ref 3.3–19.4)
Kappa, lambda light chain ratio: 10.42 — ABNORMAL HIGH (ref 0.26–1.65)
Lambda free light chains: 3.3 mg/L — ABNORMAL LOW (ref 5.7–26.3)

## 2023-12-14 LAB — IGG, IGA, IGM
IgA: 217 mg/dL (ref 61–437)
IgG (Immunoglobin G), Serum: 181 mg/dL — ABNORMAL LOW (ref 603–1613)
IgM (Immunoglobulin M), Srm: 6 mg/dL — ABNORMAL LOW (ref 20–172)

## 2023-12-15 ENCOUNTER — Telehealth: Payer: Self-pay

## 2023-12-15 NOTE — Telephone Encounter (Signed)
Patient called and stated he has the flu and is on tamiflu but wants to make sure that's okay to take. Pt informed its okay for him to take per MD, but we will cancel his infusion on 2/3. Pt scheduled 2/10. Will keep this appt.

## 2023-12-19 ENCOUNTER — Inpatient Hospital Stay: Payer: Medicare Other

## 2023-12-19 ENCOUNTER — Encounter: Payer: Self-pay | Admitting: *Deleted

## 2023-12-19 LAB — IMMUNOFIXATION REFLEX, SERUM
IgA: 248 mg/dL (ref 61–437)
IgG (Immunoglobin G), Serum: 203 mg/dL — ABNORMAL LOW (ref 603–1613)
IgM (Immunoglobulin M), Srm: 7 mg/dL — ABNORMAL LOW (ref 20–172)

## 2023-12-19 LAB — PROTEIN ELECTROPHORESIS, SERUM, WITH REFLEX
A/G Ratio: 1.3 (ref 0.7–1.7)
Albumin ELP: 3.4 g/dL (ref 2.9–4.4)
Alpha-1-Globulin: 0.3 g/dL (ref 0.0–0.4)
Alpha-2-Globulin: 1 g/dL (ref 0.4–1.0)
Beta Globulin: 1 g/dL (ref 0.7–1.3)
Gamma Globulin: 0.2 g/dL — ABNORMAL LOW (ref 0.4–1.8)
Globulin, Total: 2.6 g/dL (ref 2.2–3.9)
M-Spike, %: 0.4 g/dL — ABNORMAL HIGH
SPEP Interpretation: 0
Total Protein ELP: 6 g/dL (ref 6.0–8.5)

## 2023-12-20 ENCOUNTER — Encounter: Payer: Self-pay | Admitting: Hematology & Oncology

## 2023-12-23 ENCOUNTER — Other Ambulatory Visit: Payer: Self-pay

## 2023-12-26 ENCOUNTER — Inpatient Hospital Stay: Payer: Medicare Other | Attending: Hematology & Oncology

## 2023-12-26 ENCOUNTER — Inpatient Hospital Stay (HOSPITAL_BASED_OUTPATIENT_CLINIC_OR_DEPARTMENT_OTHER): Payer: Medicare Other | Admitting: Hematology & Oncology

## 2023-12-26 ENCOUNTER — Other Ambulatory Visit: Payer: Self-pay

## 2023-12-26 ENCOUNTER — Encounter: Payer: Self-pay | Admitting: Hematology & Oncology

## 2023-12-26 ENCOUNTER — Inpatient Hospital Stay: Payer: Medicare Other

## 2023-12-26 VITALS — BP 129/81 | HR 62 | Temp 98.9°F | Resp 20 | Ht 70.0 in | Wt 174.0 lb

## 2023-12-26 DIAGNOSIS — Z5112 Encounter for antineoplastic immunotherapy: Secondary | ICD-10-CM | POA: Diagnosis present

## 2023-12-26 DIAGNOSIS — C9000 Multiple myeloma not having achieved remission: Secondary | ICD-10-CM

## 2023-12-26 LAB — CBC WITH DIFFERENTIAL (CANCER CENTER ONLY)
Abs Immature Granulocytes: 0.03 10*3/uL (ref 0.00–0.07)
Basophils Absolute: 0 10*3/uL (ref 0.0–0.1)
Basophils Relative: 0 %
Eosinophils Absolute: 0.1 10*3/uL (ref 0.0–0.5)
Eosinophils Relative: 3 %
HCT: 32.7 % — ABNORMAL LOW (ref 39.0–52.0)
Hemoglobin: 10.8 g/dL — ABNORMAL LOW (ref 13.0–17.0)
Immature Granulocytes: 1 %
Lymphocytes Relative: 18 %
Lymphs Abs: 0.6 10*3/uL — ABNORMAL LOW (ref 0.7–4.0)
MCH: 30.9 pg (ref 26.0–34.0)
MCHC: 33 g/dL (ref 30.0–36.0)
MCV: 93.7 fL (ref 80.0–100.0)
Monocytes Absolute: 0.5 10*3/uL (ref 0.1–1.0)
Monocytes Relative: 14 %
Neutro Abs: 2.3 10*3/uL (ref 1.7–7.7)
Neutrophils Relative %: 64 %
Platelet Count: 188 10*3/uL (ref 150–400)
RBC: 3.49 MIL/uL — ABNORMAL LOW (ref 4.22–5.81)
RDW: 13.8 % (ref 11.5–15.5)
WBC Count: 3.6 10*3/uL — ABNORMAL LOW (ref 4.0–10.5)
nRBC: 0 % (ref 0.0–0.2)

## 2023-12-26 LAB — CMP (CANCER CENTER ONLY)
ALT: 9 U/L (ref 0–44)
AST: 12 U/L — ABNORMAL LOW (ref 15–41)
Albumin: 4 g/dL (ref 3.5–5.0)
Alkaline Phosphatase: 178 U/L — ABNORMAL HIGH (ref 38–126)
Anion gap: 10 (ref 5–15)
BUN: 13 mg/dL (ref 8–23)
CO2: 25 mmol/L (ref 22–32)
Calcium: 8.8 mg/dL — ABNORMAL LOW (ref 8.9–10.3)
Chloride: 110 mmol/L (ref 98–111)
Creatinine: 1.23 mg/dL (ref 0.61–1.24)
GFR, Estimated: >60 mL/min (ref >60)
Glucose, Bld: 97 mg/dL (ref 70–99)
Potassium: 4.7 mmol/L (ref 3.5–5.1)
Sodium: 145 mmol/L (ref 135–145)
Total Bilirubin: 0.5 mg/dL (ref 0.0–1.2)
Total Protein: 6.2 g/dL — ABNORMAL LOW (ref 6.5–8.1)

## 2023-12-26 LAB — LACTATE DEHYDROGENASE: LDH: 109 U/L (ref 98–192)

## 2023-12-26 MED ORDER — DEXTROSE 5 % IV SOLN
36.0000 mg/m2 | Freq: Once | INTRAVENOUS | Status: AC
  Administered 2023-12-26: 70 mg via INTRAVENOUS
  Filled 2023-12-26: qty 30

## 2023-12-26 MED ORDER — SODIUM CHLORIDE 0.9 % IV SOLN
INTRAVENOUS | Status: DC
Start: 2023-12-26 — End: 2023-12-26

## 2023-12-26 MED ORDER — PROCHLORPERAZINE MALEATE 10 MG PO TABS
10.0000 mg | ORAL_TABLET | Freq: Once | ORAL | Status: AC
Start: 2023-12-26 — End: 2023-12-26
  Administered 2023-12-26: 10 mg via ORAL
  Filled 2023-12-26: qty 1

## 2023-12-26 MED ORDER — SODIUM CHLORIDE 0.9 % IV SOLN: Freq: Once | INTRAVENOUS | Status: AC

## 2023-12-26 NOTE — Patient Instructions (Signed)
 [facility]  Discharge Instructions: Thank you for choosing [facility] to provide your oncology and hematology care.   If you have a lab appointment with the Cancer Center, please go directly to the Cancer Center and check in at the registration area.  Wear comfortable clothing and clothing appropriate for easy access to any Portacath or PICC line.   We strive to give you quality time with your provider. You may need to reschedule your appointment if you arrive late (15 or more minutes).  Arriving late affects you and other patients whose appointments are after yours.  Also, if you miss three or more appointments without notifying the office, you may be dismissed from the clinic at the provider's discretion.      For prescription refill requests, have your pharmacy contact our office and allow 72 hours for refills to be completed.    Today you received the following chemotherapy and/or immunotherapy agents:  Kyprolis      To help prevent nausea and vomiting after your treatment, we encourage you to take your nausea medication as directed.  BELOW ARE SYMPTOMS THAT SHOULD BE REPORTED IMMEDIATELY: *FEVER GREATER THAN 100.4 F (38 C) OR HIGHER *CHILLS OR SWEATING *NAUSEA AND VOMITING THAT IS NOT CONTROLLED WITH YOUR NAUSEA MEDICATION *UNUSUAL SHORTNESS OF BREATH *UNUSUAL BRUISING OR BLEEDING *URINARY PROBLEMS (pain or burning when urinating, or frequent urination) *BOWEL PROBLEMS (unusual diarrhea, constipation, pain near the anus) TENDERNESS IN MOUTH AND THROAT WITH OR WITHOUT PRESENCE OF ULCERS (sore throat, sores in mouth, or a toothache) UNUSUAL RASH, SWELLING OR PAIN  UNUSUAL VAGINAL DISCHARGE OR ITCHING   Items with * indicate a potential emergency and should be followed up as soon as possible or go to the Emergency Department if any problems should occur.  Please show the CHEMOTHERAPY ALERT CARD or IMMUNOTHERAPY  ALERT CARD at check-in to the Emergency Department and triage nurse. Should you have questions after your visit or need to cancel or reschedule your appointment, please contact [facility] - A DEPT OF [facility]  [phone] and follow the prompts.  Office hours are 8:00 a.m. to 4:30 p.m. Monday - Friday. Please note that voicemails left after 4:00 p.m. may not be returned until the following business day.  We are closed weekends and major holidays. You have access to a nurse at all times for urgent questions. Please call the main number to the clinic [phone] and follow the prompts.  For any non-urgent questions, you may also contact your provider using MyChart. We now offer e-Visits for anyone 25 and older to request care online for non-urgent symptoms. For details visit [email or web address].   Also download the MyChart app! Go to the app store, search "MyChart", open the app, select [place], and log in with your MyChart username and password.

## 2023-12-26 NOTE — Progress Notes (Signed)
 For this.  I think Hematology and Oncology Follow Up Visit  Barry Gray [id number] [birth year] 68 y.o. 12/26/2023   Principle Diagnosis:  IgA Kappa myeloma -- normal cytogenetics/FISH (-) --relapse  Current Therapy:   S/p cervical spine stabilization -- 04/28/2022 Faspro/Velcade /Decadron  -- s/p cycle #6-- start on 05/20/2022 Zometa  4 mg IV q 3 months -- d/c on 08/05/2023 due to jaw pain Status post autologous stem cell transplant -- [facility] on 12/03/2022 Faspro/Velcade  -- maintenance -  start on 04/28/2023 -Velcade  discontinued after 07/08/2023 Kyprolis /pomalidomide  --status post cycle #1 -- start on 11/14/2023 --pomalidomide  DC on 11/28/2023  -restarted at 2 mg p.o. daily on 12/26/2023     Interim History:  [name] is back for follow-up.  He is doing quite nicely.  He really has had no complaints although he had the "flu" about 2 weeks ago.  I think his wife had a also.  They may have gotten it from a restaurant.  He has not yet started the pomalidomide .  We will hopefully get him to start the pomalidomide  this week.  It sounds like the CAR-T therapy probably would not be until late March or early April.  He has had no rashes.  He has had no nausea or vomiting.  He has had no change in bowel or bladder habits.  His last myeloma studies showed M spike of 0.4 g/dL the IgA level was 409 mg/dL.  His Kappa light chain was down to 3.4 mg/dL.  He has had no diarrhea.  He has had no leg swelling.  Overall, I would say that his performance status is probably ECOG 1.      Wt Readings from Last 3 Encounters:  12/26/23 174 lb (78.9 kg)  12/12/23 176 lb (79.8 kg)  11/28/23 171 lb 1.9 oz (77.6 kg)   Medications:  Current Outpatient Medications:    acyclovir  (ZOVIRAX ) 400 MG tablet, Take 1 tablet (400 mg total) by mouth 2 (two) times daily., Disp: 60 tablet, Rfl: 3   metFORMIN  (GLUCOPHAGE ) 500 MG tablet, Take 500 mg by mouth 2 (two) times daily with a meal., Disp: , Rfl:     tamsulosin  (FLOMAX ) 0.4 MG CAPS capsule, Take 0.4 mg by mouth daily., Disp: , Rfl:    aspirin 81 MG chewable tablet, Chew 81 mg by mouth daily. (Patient not taking: Reported on 12/26/2023), Disp: , Rfl:    cetirizine  (ZYRTEC  ALLERGY) 10 MG tablet, Take 1 tablet (10 mg total) by mouth at bedtime. (Patient not taking: Reported on 12/26/2023), Disp: 14 tablet, Rfl: 0   dexamethasone  (DECADRON ) 4 MG tablet, Take one tablet (4mg ) prior to Kyprolis  treatments once a week times three weeks and then one week off (Patient not taking: Reported on 12/26/2023), Disp: 30 tablet, Rfl: 2   famotidine  (PEPCID ) 20 MG tablet, Take 1 tablet (20 mg total) by mouth 2 (two) times daily. (Patient not taking: Reported on 12/26/2023), Disp: 30 tablet, Rfl: 0   lactulose  (CHRONULAC ) 10 GM/15ML solution, Take 15 mLs (10 g total) by mouth 3 (three) times daily. (Patient not taking: Reported on 12/26/2023), Disp: 946 mL, Rfl: 2   levothyroxine (SYNTHROID) 75 MCG tablet, Take 75 mcg by mouth daily before breakfast. (Patient not taking: Reported on 12/26/2023), Disp: , Rfl:    lidocaine -prilocaine  (EMLA ) cream, Apply 1 Application topically. Apply a dime size of cream to port-a-cath 1-2 hours prior to access. Cover with [employer]. (Patient not taking: Reported on 12/26/2023), Disp: , Rfl:    ondansetron  (ZOFRAN ) 8 MG tablet,  Take 1 tablet (8 mg total) by mouth every 8 (eight) hours as needed for nausea or vomiting. (Patient not taking: Reported on 12/26/2023), Disp: 30 tablet, Rfl: 1   pomalidomide  (POMALYST ) 2 MG capsule, Take 1 capsule (2 mg total) by mouth daily. Patient to take one capsule (2 mg total) for 21 days on, 7 days off.  [employer] Auth # [id number]    Date Obtained 11/30/2023 (Patient not taking: Reported on 12/26/2023), Disp: 21 capsule, Rfl: 0   prochlorperazine  (COMPAZINE ) 10 MG tablet, Take 1 tablet (10 mg total) by mouth every 6 (six) hours as needed for nausea or vomiting. (Patient not taking: Reported on 12/26/2023), Disp: 30  tablet, Rfl: 1  Allergies:  Allergies  Allergen Reactions   Revlimid  [Lenalidomide ] Rash    Past Medical History, Surgical history, Social history, and Family History were reviewed and updated.  Review of Systems: Review of Systems  Constitutional: Negative.   HENT:  Negative.    Eyes: Negative.   Respiratory: Negative.    Cardiovascular: Negative.   Gastrointestinal:  Positive for diarrhea.  Endocrine: Negative.   Genitourinary:  Negative for bladder incontinence and difficulty urinating.   Musculoskeletal:  Positive for neck pain.  Skin:  Positive for itching and rash.  Neurological: Negative.   Hematological: Negative.   Psychiatric/Behavioral: Negative.      Physical Exam:  Vital signs show temperature of 98.9.  Pulse 62.  Blood pressure 129/81.  Weight is 174 pounds.    Wt Readings from Last 3 Encounters:  12/26/23 174 lb (78.9 kg)  12/12/23 176 lb (79.8 kg)  11/28/23 171 lb 1.9 oz (77.6 kg)    Physical Exam Vitals reviewed.  HENT:     Head: Normocephalic and atraumatic.  Eyes:     Pupils: Pupils are equal, round, and reactive to light.  Cardiovascular:     Rate and Rhythm: Normal rate and regular rhythm.     Heart sounds: Normal heart sounds.  Pulmonary:     Effort: Pulmonary effort is normal.     Breath sounds: Normal breath sounds.  Abdominal:     General: Bowel sounds are normal.     Palpations: Abdomen is soft.  Musculoskeletal:        General: No tenderness or deformity. Normal range of motion.     Cervical back: Normal range of motion.  Lymphadenopathy:     Cervical: No cervical adenopathy.  Skin:    General: Skin is warm and dry.     Findings: Rash (systemic erythematic maculopapular rash. Most extensive around the lower torso) present. No erythema.  Neurological:     Mental Status: He is alert and oriented to person, place, and time.  Psychiatric:        Behavior: Behavior normal.        Thought Content: Thought content normal.         Judgment: Judgment normal.      Lab Results  Component Value Date   WBC 3.6 (L) 12/26/2023   HGB 10.8 (L) 12/26/2023   HCT 32.7 (L) 12/26/2023   MCV 93.7 12/26/2023   PLT 188 12/26/2023     Chemistry      Component Value Date/Time   NA 145 12/26/2023 0839   K 4.7 12/26/2023 0839   CL 110 12/26/2023 0839   CO2 25 12/26/2023 0839   BUN 13 12/26/2023 0839   CREATININE 1.23 12/26/2023 0839      Component Value Date/Time   CALCIUM 8.8 (L) 12/26/2023 5409  ALKPHOS 178 (H) 12/26/2023 0839   AST 12 (L) 12/26/2023 0839   ALT 9 12/26/2023 0839   BILITOT 0.5 12/26/2023 0839       Impression and Plan: Mr. Hemsath is a very nice 68 year old white male.  He has IgA kappa myeloma.  Most of his myeloma is Kappa light chain with recurrence.  He underwent upfront chemotherapy and then underwent transplant.  He had his transplant a year ago.  He then relapsed late last year.  He is done so well.  I will not adjust the dose of the Kyprolis  upward.  Again I am very happy with how well he is done.  Hopefully, he will do well with the pomalidomide .  I told him that we will really have to stay on treatment until he gets the CAR-T therapy.  We will plan to get him back to see us  in another month.   Ivor Mars, MD 2/10/20259:53 AM

## 2023-12-27 ENCOUNTER — Encounter: Payer: Self-pay | Admitting: Hematology & Oncology

## 2023-12-27 LAB — KAPPA/LAMBDA LIGHT CHAINS
Kappa free light chain: 22.7 mg/L — ABNORMAL HIGH (ref 3.3–19.4)
Kappa, lambda light chain ratio: 6.88 — ABNORMAL HIGH (ref 0.26–1.65)
Lambda free light chains: 3.3 mg/L — ABNORMAL LOW (ref 5.7–26.3)

## 2023-12-28 ENCOUNTER — Encounter: Payer: Self-pay | Admitting: *Deleted

## 2023-12-28 ENCOUNTER — Other Ambulatory Visit: Payer: Self-pay

## 2023-12-28 LAB — PROTEIN ELECTROPHORESIS, SERUM, WITH REFLEX
A/G Ratio: 1.4 (ref 0.7–1.7)
Albumin ELP: 3.3 g/dL (ref 2.9–4.4)
Alpha-1-Globulin: 0.3 g/dL (ref 0.0–0.4)
Alpha-2-Globulin: 1.1 g/dL — ABNORMAL HIGH (ref 0.4–1.0)
Beta Globulin: 0.8 g/dL (ref 0.7–1.3)
Gamma Globulin: 0.2 g/dL — ABNORMAL LOW (ref 0.4–1.8)
Globulin, Total: 2.3 g/dL (ref 2.2–3.9)
Total Protein ELP: 5.6 g/dL — ABNORMAL LOW (ref 6.0–8.5)

## 2023-12-28 LAB — IGG, IGA, IGM
IgA: 78 mg/dL (ref 61–437)
IgG (Immunoglobin G), Serum: 175 mg/dL — ABNORMAL LOW (ref 603–1613)
IgM (Immunoglobulin M), Srm: <5 mg/dL — ABNORMAL LOW (ref 20–172)

## 2023-12-29 ENCOUNTER — Telehealth: Payer: Self-pay

## 2023-12-29 NOTE — Telephone Encounter (Signed)
Advised via MyChart.

## 2023-12-29 NOTE — Telephone Encounter (Signed)
-----   Message from Josph Macho sent at 12/29/2023  6:22 AM EST ----- Call - there is very little active myeloma now!!!  Great job!!!  Cindee Lame

## 2023-12-30 LAB — UPEP/UIFE/LIGHT CHAINS/TP, 24-HR UR
% BETA, Urine: 17.3 %
ALPHA 1 URINE: 3.8 %
Albumin, U: 68.3 %
Alpha 2, Urine: 7.7 %
Free Kappa Lt Chains,Ur: 85.14 mg/L (ref 1.17–86.46)
Free Kappa/Lambda Ratio: 8.94 (ref 1.83–14.26)
Free Lambda Lt Chains,Ur: 9.52 mg/L (ref 0.27–15.21)
GAMMA GLOBULIN URINE: 2.8 %
M-SPIKE %, Urine: 6.6 % — ABNORMAL HIGH
M-Spike, Mg/24 Hr: 36 mg/(24.h) — ABNORMAL HIGH
Total Protein, Urine-Ur/day: 550 mg/(24.h) — ABNORMAL HIGH (ref 30–150)
Total Protein, Urine: 27.5 mg/dL
Total Volume: 2000

## 2024-01-02 ENCOUNTER — Inpatient Hospital Stay: Payer: Medicare Other

## 2024-01-02 VITALS — BP 135/71 | HR 57 | Temp 98.3°F | Resp 19 | Wt 180.0 lb

## 2024-01-02 DIAGNOSIS — C9000 Multiple myeloma not having achieved remission: Secondary | ICD-10-CM

## 2024-01-02 DIAGNOSIS — Z5112 Encounter for antineoplastic immunotherapy: Secondary | ICD-10-CM | POA: Diagnosis not present

## 2024-01-02 LAB — CBC WITH DIFFERENTIAL (CANCER CENTER ONLY)
Abs Immature Granulocytes: 0.04 10*3/uL (ref 0.00–0.07)
Basophils Absolute: 0 10*3/uL (ref 0.0–0.1)
Basophils Relative: 1 %
Eosinophils Absolute: 0.1 10*3/uL (ref 0.0–0.5)
Eosinophils Relative: 1 %
HCT: 32.6 % — ABNORMAL LOW (ref 39.0–52.0)
Hemoglobin: 10.7 g/dL — ABNORMAL LOW (ref 13.0–17.0)
Immature Granulocytes: 1 %
Lymphocytes Relative: 17 %
Lymphs Abs: 0.9 10*3/uL (ref 0.7–4.0)
MCH: 31.1 pg (ref 26.0–34.0)
MCHC: 32.8 g/dL (ref 30.0–36.0)
MCV: 94.8 fL (ref 80.0–100.0)
Monocytes Absolute: 0.7 10*3/uL (ref 0.1–1.0)
Monocytes Relative: 13 %
Neutro Abs: 3.9 10*3/uL (ref 1.7–7.7)
Neutrophils Relative %: 67 %
Platelet Count: 171 10*3/uL (ref 150–400)
RBC: 3.44 MIL/uL — ABNORMAL LOW (ref 4.22–5.81)
RDW: 14.2 % (ref 11.5–15.5)
WBC Count: 5.7 10*3/uL (ref 4.0–10.5)
nRBC: 0 % (ref 0.0–0.2)

## 2024-01-02 LAB — CMP (CANCER CENTER ONLY)
ALT: 8 U/L (ref 0–44)
AST: 11 U/L — ABNORMAL LOW (ref 15–41)
Albumin: 4.1 g/dL (ref 3.5–5.0)
Alkaline Phosphatase: 144 U/L — ABNORMAL HIGH (ref 38–126)
Anion gap: 7 (ref 5–15)
BUN: 12 mg/dL (ref 8–23)
CO2: 27 mmol/L (ref 22–32)
Calcium: 8.7 mg/dL — ABNORMAL LOW (ref 8.9–10.3)
Chloride: 108 mmol/L (ref 98–111)
Creatinine: 1.28 mg/dL — ABNORMAL HIGH (ref 0.61–1.24)
GFR, Estimated: >60 mL/min (ref >60)
Glucose, Bld: 100 mg/dL — ABNORMAL HIGH (ref 70–99)
Potassium: 5.1 mmol/L (ref 3.5–5.1)
Sodium: 142 mmol/L (ref 135–145)
Total Bilirubin: 0.5 mg/dL (ref 0.0–1.2)
Total Protein: 5.8 g/dL — ABNORMAL LOW (ref 6.5–8.1)

## 2024-01-02 MED ORDER — SODIUM CHLORIDE 0.9 % IV SOLN: Freq: Once | INTRAVENOUS | Status: AC

## 2024-01-02 MED ORDER — PROCHLORPERAZINE MALEATE 10 MG PO TABS
10.0000 mg | ORAL_TABLET | Freq: Once | ORAL | Status: AC
  Administered 2024-01-02: 10 mg via ORAL
  Filled 2024-01-02: qty 1

## 2024-01-02 MED ORDER — SODIUM CHLORIDE 0.9 % IV SOLN: INTRAVENOUS | Status: DC

## 2024-01-02 MED ORDER — DEXTROSE 5 % IV SOLN
36.0000 mg/m2 | Freq: Once | INTRAVENOUS | Status: DC
  Filled 2024-01-02: qty 35

## 2024-01-02 NOTE — Patient Instructions (Addendum)
[facility] called said to [facility] TREATMENT on pt as he has Cell Collection 2/28.

## 2024-01-09 ENCOUNTER — Inpatient Hospital Stay: Payer: Medicare Other

## 2024-01-09 ENCOUNTER — Inpatient Hospital Stay: Payer: Medicare Other | Admitting: Hematology & Oncology

## 2024-01-10 ENCOUNTER — Other Ambulatory Visit: Payer: Self-pay

## 2024-01-10 MED ORDER — POMALIDOMIDE 2 MG PO CAPS: 2.0000 mg | ORAL_CAPSULE | Freq: Every day | ORAL | 0 refills | Status: DC

## 2024-01-16 ENCOUNTER — Inpatient Hospital Stay: Payer: Medicare Other

## 2024-01-17 ENCOUNTER — Inpatient Hospital Stay: Payer: Medicare Other | Attending: Hematology & Oncology

## 2024-01-17 ENCOUNTER — Other Ambulatory Visit: Payer: Self-pay

## 2024-01-17 ENCOUNTER — Inpatient Hospital Stay: Payer: Medicare Other

## 2024-01-17 ENCOUNTER — Inpatient Hospital Stay (HOSPITAL_BASED_OUTPATIENT_CLINIC_OR_DEPARTMENT_OTHER): Payer: Medicare Other | Admitting: Hematology & Oncology

## 2024-01-17 ENCOUNTER — Encounter: Payer: Self-pay | Admitting: Hematology & Oncology

## 2024-01-17 VITALS — BP 141/67 | HR 60 | Temp 98.2°F | Resp 18

## 2024-01-17 DIAGNOSIS — Z5112 Encounter for antineoplastic immunotherapy: Secondary | ICD-10-CM | POA: Diagnosis present

## 2024-01-17 DIAGNOSIS — C9000 Multiple myeloma not having achieved remission: Secondary | ICD-10-CM

## 2024-01-17 LAB — LACTATE DEHYDROGENASE: LDH: 116 U/L (ref 98–192)

## 2024-01-17 LAB — CMP (CANCER CENTER ONLY)
ALT: 8 U/L (ref 0–44)
AST: 12 U/L — ABNORMAL LOW (ref 15–41)
Albumin: 4.2 g/dL (ref 3.5–5.0)
Alkaline Phosphatase: 91 U/L (ref 38–126)
Anion gap: 9 (ref 5–15)
BUN: 13 mg/dL (ref 8–23)
CO2: 26 mmol/L (ref 22–32)
Calcium: 8.9 mg/dL (ref 8.9–10.3)
Chloride: 106 mmol/L (ref 98–111)
Creatinine: 1.43 mg/dL — ABNORMAL HIGH (ref 0.61–1.24)
GFR, Estimated: 54 mL/min — ABNORMAL LOW (ref >60)
Glucose, Bld: 98 mg/dL (ref 70–99)
Potassium: 4.4 mmol/L (ref 3.5–5.1)
Sodium: 141 mmol/L (ref 135–145)
Total Bilirubin: 0.6 mg/dL (ref 0.0–1.2)
Total Protein: 6.1 g/dL — ABNORMAL LOW (ref 6.5–8.1)

## 2024-01-17 LAB — CBC WITH DIFFERENTIAL (CANCER CENTER ONLY)
Abs Immature Granulocytes: 0.02 10*3/uL (ref 0.00–0.07)
Basophils Absolute: 0 10*3/uL (ref 0.0–0.1)
Basophils Relative: 1 %
Eosinophils Absolute: 0.2 10*3/uL (ref 0.0–0.5)
Eosinophils Relative: 4 %
HCT: 31.8 % — ABNORMAL LOW (ref 39.0–52.0)
Hemoglobin: 10.8 g/dL — ABNORMAL LOW (ref 13.0–17.0)
Immature Granulocytes: 0 %
Lymphocytes Relative: 15 %
Lymphs Abs: 0.9 10*3/uL (ref 0.7–4.0)
MCH: 32.2 pg (ref 26.0–34.0)
MCHC: 34 g/dL (ref 30.0–36.0)
MCV: 94.9 fL (ref 80.0–100.0)
Monocytes Absolute: 0.6 10*3/uL (ref 0.1–1.0)
Monocytes Relative: 11 %
Neutro Abs: 4.1 10*3/uL (ref 1.7–7.7)
Neutrophils Relative %: 69 %
Platelet Count: 148 10*3/uL — ABNORMAL LOW (ref 150–400)
RBC: 3.35 MIL/uL — ABNORMAL LOW (ref 4.22–5.81)
RDW: 14.8 % (ref 11.5–15.5)
WBC Count: 5.9 10*3/uL (ref 4.0–10.5)
nRBC: 0 % (ref 0.0–0.2)

## 2024-01-17 MED ORDER — SODIUM CHLORIDE 0.9 % IV SOLN: INTRAVENOUS | Status: DC

## 2024-01-17 MED ORDER — PROCHLORPERAZINE MALEATE 10 MG PO TABS
10.0000 mg | ORAL_TABLET | Freq: Once | ORAL | Status: AC
  Administered 2024-01-17: 10 mg via ORAL
  Filled 2024-01-17: qty 1

## 2024-01-17 MED ORDER — DEXTROSE 5 % IV SOLN
36.0000 mg/m2 | Freq: Once | INTRAVENOUS | Status: AC
  Administered 2024-01-17: 70 mg via INTRAVENOUS
  Filled 2024-01-17: qty 30

## 2024-01-17 MED ORDER — SODIUM CHLORIDE 0.9 % IV SOLN: Freq: Once | INTRAVENOUS | Status: DC

## 2024-01-17 NOTE — Patient Instructions (Signed)
Carfilzomib Injection What is this medication? CARFILZOMIB (kar FILZ oh mib) treats multiple myeloma, a type of bone marrow cancer. It works by blocking a protein that causes cancer cells to grow and multiply. This helps to slow or stop the spread of cancer cells. This medicine may be used for other purposes; ask your health care provider or pharmacist if you have questions. COMMON BRAND NAME(S): KYPROLIS What should I tell my care team before I take this medication? They need to know if you have any of these conditions: Heart disease History of blood clots Irregular heartbeat Kidney disease Liver disease Lung or breathing disease An unusual or allergic reaction to carfilzomib, or other medications, foods, dyes, or preservatives If you or your partner are pregnant or trying to get pregnant Breastfeeding How should I use this medication? This medication is injected into a vein. It is given by your care team in a hospital or clinic setting. Talk to your care team about the use of this medication in children. Special care may be needed. Overdosage: If you think you have taken too much of this medicine contact a poison control center or emergency room at once. NOTE: This medicine is only for you. Do not share this medicine with others. What if I miss a dose? Keep appointments for follow-up doses. It is important not to miss your dose. Call your care team if you are unable to keep an appointment. What may interact with this medication? Interactions are not expected. This list may not describe all possible interactions. Give your health care provider a list of all the medicines, herbs, non-prescription drugs, or dietary supplements you use. Also tell them if you smoke, drink alcohol, or use illegal drugs. Some items may interact with your medicine. What should I watch for while using this medication? Your condition will be monitored carefully while you are receiving this medication. You may need  blood work while taking this medication. Check with your care team if you have severe diarrhea, nausea, and vomiting, or if you sweat a lot. The loss of too much body fluid may make it dangerous for you to take this medication. This medication may affect your coordination, reaction time, or judgment. Do not drive or operate machinery until you know how this medication affects you. Sit up or stand slowly to reduce the risk of dizzy or fainting spells. Drinking alcohol with this medication can increase the risk of these side effects. Talk to your care team if you may be pregnant. Serious birth defects can occur if you take this medication during pregnancy and for 6 months after the last dose. You will need a negative pregnancy test before starting this medication. Contraception is recommended while taking this medication and for 6 months after the last dose. Your care team can help you find an option that works for you. If your partner can get pregnant, use a condom during sex while taking this medication and for 3 months after the last dose. Do not breastfeed while taking this medication and for 2 weeks after the last dose. This medication may cause infertility. Talk to your care team if you are concerned about your fertility. What side effects may I notice from receiving this medication? Side effects that you should report to your care team as soon as possible: Allergic reactions--skin rash, itching, hives, swelling of the face, lips, tongue, or throat Bleeding--bloody or black, tar-like stools, vomiting blood or brown material that looks like coffee grounds, red or dark brown urine,  small red or purple spots on skin, unusual bruising or bleeding Blood clot--pain, swelling, or warmth in the leg, shortness of breath, chest pain Dizziness, loss of balance or coordination, confusion or trouble speaking Heart attack--pain or tightness in the chest, shoulders, arms, or jaw, nausea, shortness of breath, cold  or clammy skin, feeling faint or lightheaded Heart failure--shortness of breath, swelling of the ankles, feet, or hands, sudden weight gain, unusual weakness or fatigue Heart rhythm changes--fast or irregular heartbeat, dizziness, feeling faint or lightheaded, chest pain, trouble breathing Increase in blood pressure Infection--fever, chills, cough, sore throat, wounds that don't heal, pain or trouble when passing urine, general feeling of discomfort or being unwell Infusion reactions--chest pain, shortness of breath or trouble breathing, feeling faint or lightheaded Kidney injury--decrease in the amount of urine, swelling of the ankles, hands, or feet Liver injury--right upper belly pain, loss of appetite, nausea, light-colored stool, dark yellow or brown urine, yellowing skin or eyes, unusual weakness or fatigue Lung injury--shortness of breath or trouble breathing, cough, spitting up blood, chest pain, fever Pulmonary hypertension--shortness of breath, chest pain, fast or irregular heartbeat, feeling faint or lightheaded, fatigue, swelling of the ankles or feet Stomach pain, bloody diarrhea, pale skin, unusual weakness or fatigue, decrease in the amount of urine, which may be signs of hemolytic uremic syndrome Sudden and severe headache, confusion, change in vision, seizures, which may be signs of posterior reversible encephalopathy syndrome (PRES) TTP--purple spots on the skin or inside the mouth, pale skin, yellowing skin or eyes, unusual weakness or fatigue, fever, fast or irregular heartbeat, confusion, change in vision, trouble speaking, trouble walking Tumor lysis syndrome (TLS)--nausea, vomiting, diarrhea, decrease in the amount of urine, dark urine, unusual weakness or fatigue, confusion, muscle pain or cramps, fast or irregular heartbeat, joint pain Side effects that usually do not require medical attention (report to your care team if they continue or are  bothersome): Diarrhea Fatigue Nausea Trouble sleeping This list may not describe all possible side effects. Call your doctor for medical advice about side effects. You may report side effects to FDA at 1-800-FDA-1088. Where should I keep my medication? This medication is given in a hospital or clinic. It will not be stored at home. NOTE: This sheet is a summary. It may not cover all possible information. If you have questions about this medicine, talk to your doctor, pharmacist, or health care provider.  2024 Elsevier/Gold Standard (2022-04-01 00:00:00)

## 2024-01-17 NOTE — Progress Notes (Signed)
 For this.  I think Hematology and Oncology Follow Up Visit  Barry Gray [id number] 03-Sep-1956 68 y.o. 01/17/2024   Principle Diagnosis:  IgA Kappa myeloma -- normal cytogenetics/FISH (-) --relapse  Current Therapy:   S/p cervical spine stabilization -- 04/28/2022 Faspro/Velcade/Decadron -- s/p cycle #6-- start on 05/20/2022 Zometa 4 mg IV q 3 months -- d/c on 08/05/2023 due to jaw pain Status post autologous stem cell transplant -- [facility] on 12/03/2022 Faspro/Velcade -- maintenance -  start on 04/28/2023 -Velcade discontinued after 07/08/2023 Kyprolis/pomalidomide --status post cycle #1 -- start on 11/14/2023 --pomalidomide DC on 11/28/2023  -restarted at 2 mg p.o. daily on 12/26/2023     Interim History:  [name] is back for follow-up.  So far, he has done incredibly well.  His numbers have come down quite nicely with treatment.  The last time that we saw him, the monoclonal spike was not found in his blood.  His IgA level was down to 78 mg/dL.  Kappa light chain was down to 2.3 mg/dL.  He has had his T cells collected.  It sounds like he may go for his CAR-T therapy sometime in late April.  Otherwise, he has been feeling pretty good.  He has had no fever.  He has had no nausea or vomiting.  He has had no bleeding.  He has been no rashes.  We have him on a lower dose of the pomalidomide which she seems to be doing well with.  He has had no leg swelling.  He has had no neuropathic issues.  Overall, I would have to say that his performance status is probably ECOG 1.     Wt Readings from Last 3 Encounters:  01/02/24 180 lb (81.6 kg)  12/26/23 174 lb (78.9 kg)  12/12/23 176 lb (79.8 kg)   Medications:  Current Outpatient Medications:    acyclovir (ZOVIRAX) 400 MG tablet, Take 1 tablet (400 mg total) by mouth 2 (two) times daily., Disp: 60 tablet, Rfl: 3   aspirin 81 MG chewable tablet, Chew 81 mg by mouth daily. (Patient not taking: Reported on 12/26/2023), Disp: , Rfl:     cetirizine (ZYRTEC ALLERGY) 10 MG tablet, Take 1 tablet (10 mg total) by mouth at bedtime. (Patient not taking: Reported on 12/26/2023), Disp: 14 tablet, Rfl: 0   dexamethasone (DECADRON) 4 MG tablet, Take one tablet (4mg ) prior to Kyprolis treatments once a week times three weeks and then one week off (Patient not taking: Reported on 12/26/2023), Disp: 30 tablet, Rfl: 2   famotidine (PEPCID) 20 MG tablet, Take 1 tablet (20 mg total) by mouth 2 (two) times daily. (Patient not taking: Reported on 12/26/2023), Disp: 30 tablet, Rfl: 0   lactulose (CHRONULAC) 10 GM/15ML solution, Take 15 mLs (10 g total) by mouth 3 (three) times daily. (Patient not taking: Reported on 12/26/2023), Disp: 946 mL, Rfl: 2   levothyroxine (SYNTHROID) 75 MCG tablet, Take 75 mcg by mouth daily before breakfast. (Patient not taking: Reported on 12/26/2023), Disp: , Rfl:    lidocaine-prilocaine (EMLA) cream, Apply 1 Application topically. Apply a dime size of cream to port-a-cath 1-2 hours prior to access. Cover with [employer]. (Patient not taking: Reported on 12/26/2023), Disp: , Rfl:    metFORMIN (GLUCOPHAGE) 500 MG tablet, Take 500 mg by mouth 2 (two) times daily with a meal., Disp: , Rfl:    ondansetron (ZOFRAN) 8 MG tablet, Take 1 tablet (8 mg total) by mouth every 8 (eight) hours as needed for nausea or  vomiting. (Patient not taking: Reported on 12/26/2023), Disp: 30 tablet, Rfl: 1   pomalidomide (POMALYST) 2 MG capsule, Take 1 capsule (2 mg total) by mouth daily. Patient to take one capsule (2 mg total) for 21 days on, 7 days off.  [employer] Auth # [id number]   Date Obtained 01/10/2024, Disp: 21 capsule, Rfl: 0   prochlorperazine (COMPAZINE) 10 MG tablet, Take 1 tablet (10 mg total) by mouth every 6 (six) hours as needed for nausea or vomiting. (Patient not taking: Reported on 12/26/2023), Disp: 30 tablet, Rfl: 1   tamsulosin (FLOMAX) 0.4 MG CAPS capsule, Take 0.4 mg by mouth daily., Disp: , Rfl:   Allergies:  Allergies  Allergen  Reactions   Revlimid [Lenalidomide] Rash    Past Medical History, Surgical history, Social history, and Family History were reviewed and updated.  Review of Systems: Review of Systems  Constitutional: Negative.   HENT:  Negative.    Eyes: Negative.   Respiratory: Negative.    Cardiovascular: Negative.   Gastrointestinal:  Positive for diarrhea.  Endocrine: Negative.   Genitourinary:  Negative for bladder incontinence and difficulty urinating.   Musculoskeletal:  Positive for neck pain.  Skin:  Positive for itching and rash.  Neurological: Negative.   Hematological: Negative.   Psychiatric/Behavioral: Negative.      Physical Exam:  Vital signs show temperature of 98.2.  Pulse 60.  Blood pressure 141/67.  Weight was 184 pounds.    Wt Readings from Last 3 Encounters:  01/02/24 180 lb (81.6 kg)  12/26/23 174 lb (78.9 kg)  12/12/23 176 lb (79.8 kg)    Physical Exam Vitals reviewed.  HENT:     Head: Normocephalic and atraumatic.  Eyes:     Pupils: Pupils are equal, round, and reactive to light.  Cardiovascular:     Rate and Rhythm: Normal rate and regular rhythm.     Heart sounds: Normal heart sounds.  Pulmonary:     Effort: Pulmonary effort is normal.     Breath sounds: Normal breath sounds.  Abdominal:     General: Bowel sounds are normal.     Palpations: Abdomen is soft.  Musculoskeletal:        General: No tenderness or deformity. Normal range of motion.     Cervical back: Normal range of motion.  Lymphadenopathy:     Cervical: No cervical adenopathy.  Skin:    General: Skin is warm and dry.     Findings: Rash (systemic erythematic maculopapular rash. Most extensive around the lower torso) present. No erythema.  Neurological:     Mental Status: He is alert and oriented to person, place, and time.  Psychiatric:        Behavior: Behavior normal.        Thought Content: Thought content normal.        Judgment: Judgment normal.      Lab Results   Component Value Date   WBC 5.9 01/17/2024   HGB 10.8 (L) 01/17/2024   HCT 31.8 (L) 01/17/2024   MCV 94.9 01/17/2024   PLT 148 (L) 01/17/2024     Chemistry      Component Value Date/Time   NA 142 01/02/2024 0835   K 5.1 01/02/2024 0835   CL 108 01/02/2024 0835   CO2 27 01/02/2024 0835   BUN 12 01/02/2024 0835   CREATININE 1.28 (H) 01/02/2024 0835      Component Value Date/Time   CALCIUM 8.7 (L) 01/02/2024 0835   ALKPHOS 144 (H) 01/02/2024 2130  AST 11 (L) 01/02/2024 0835   ALT 8 01/02/2024 0835   BILITOT 0.5 01/02/2024 0835       Impression and Plan: Mr. Rathert is a very nice 68 year old white male.  He has IgA kappa myeloma.  Most of his myeloma is Kappa light chain with recurrence.  He underwent upfront chemotherapy and then underwent transplant.  He had his transplant a year ago.  He then relapsed late last year.  He is done so well.  I will not adjust the dose of the Kyprolis upward.  Again I am very happy with how well he is done.  Hopefully, he will do well with the pomalidomide.  We will plan to have him come back to see [place] in a month.  By then, we should have a better idea as to when the CAR-T infusion will happen.   Josph Macho, MD 3/4/20258:47 AM

## 2024-01-18 LAB — KAPPA/LAMBDA LIGHT CHAINS
Kappa free light chain: 24.4 mg/L — ABNORMAL HIGH (ref 3.3–19.4)
Kappa, lambda light chain ratio: 6.78 — ABNORMAL HIGH (ref 0.26–1.65)
Lambda free light chains: 3.6 mg/L — ABNORMAL LOW (ref 5.7–26.3)

## 2024-01-18 LAB — IGG, IGA, IGM
IgA: 58 mg/dL — ABNORMAL LOW (ref 61–437)
IgG (Immunoglobin G), Serum: 188 mg/dL — ABNORMAL LOW (ref 603–1613)
IgM (Immunoglobulin M), Srm: 8 mg/dL — ABNORMAL LOW (ref 20–172)

## 2024-01-19 LAB — PROTEIN ELECTROPHORESIS, SERUM, WITH REFLEX
A/G Ratio: 1.5 (ref 0.7–1.7)
Albumin ELP: 3.5 g/dL (ref 2.9–4.4)
Alpha-1-Globulin: 0.3 g/dL (ref 0.0–0.4)
Alpha-2-Globulin: 0.9 g/dL (ref 0.4–1.0)
Beta Globulin: 0.9 g/dL (ref 0.7–1.3)
Gamma Globulin: 0.2 g/dL — ABNORMAL LOW (ref 0.4–1.8)
Globulin, Total: 2.3 g/dL (ref 2.2–3.9)
Total Protein ELP: 5.8 g/dL — ABNORMAL LOW (ref 6.0–8.5)

## 2024-01-23 ENCOUNTER — Inpatient Hospital Stay: Payer: Medicare Other

## 2024-01-23 ENCOUNTER — Inpatient Hospital Stay: Payer: Medicare Other | Admitting: Hematology & Oncology

## 2024-01-23 VITALS — BP 151/73 | HR 55 | Temp 97.5°F | Resp 19

## 2024-01-23 DIAGNOSIS — Z5112 Encounter for antineoplastic immunotherapy: Secondary | ICD-10-CM | POA: Diagnosis not present

## 2024-01-23 DIAGNOSIS — C9000 Multiple myeloma not having achieved remission: Secondary | ICD-10-CM

## 2024-01-23 LAB — CMP (CANCER CENTER ONLY)
ALT: 9 U/L (ref 0–44)
AST: 12 U/L — ABNORMAL LOW (ref 15–41)
Albumin: 4.4 g/dL (ref 3.5–5.0)
Alkaline Phosphatase: 81 U/L (ref 38–126)
Anion gap: 10 (ref 5–15)
BUN: 17 mg/dL (ref 8–23)
CO2: 24 mmol/L (ref 22–32)
Calcium: 8.9 mg/dL (ref 8.9–10.3)
Chloride: 107 mmol/L (ref 98–111)
Creatinine: 1.41 mg/dL — ABNORMAL HIGH (ref 0.61–1.24)
GFR, Estimated: 55 mL/min — ABNORMAL LOW (ref >60)
Glucose, Bld: 94 mg/dL (ref 70–99)
Potassium: 4 mmol/L (ref 3.5–5.1)
Sodium: 141 mmol/L (ref 135–145)
Total Bilirubin: 0.7 mg/dL (ref 0.0–1.2)
Total Protein: 6.3 g/dL — ABNORMAL LOW (ref 6.5–8.1)

## 2024-01-23 LAB — CBC WITH DIFFERENTIAL (CANCER CENTER ONLY)
Abs Immature Granulocytes: 0.03 10*3/uL (ref 0.00–0.07)
Basophils Absolute: 0 10*3/uL (ref 0.0–0.1)
Basophils Relative: 0 %
Eosinophils Absolute: 0.1 10*3/uL (ref 0.0–0.5)
Eosinophils Relative: 3 %
HCT: 32.1 % — ABNORMAL LOW (ref 39.0–52.0)
Hemoglobin: 10.8 g/dL — ABNORMAL LOW (ref 13.0–17.0)
Immature Granulocytes: 1 %
Lymphocytes Relative: 14 %
Lymphs Abs: 0.7 10*3/uL (ref 0.7–4.0)
MCH: 31.8 pg (ref 26.0–34.0)
MCHC: 33.6 g/dL (ref 30.0–36.0)
MCV: 94.4 fL (ref 80.0–100.0)
Monocytes Absolute: 0.6 10*3/uL (ref 0.1–1.0)
Monocytes Relative: 12 %
Neutro Abs: 3.9 10*3/uL (ref 1.7–7.7)
Neutrophils Relative %: 70 %
Platelet Count: 173 10*3/uL (ref 150–400)
RBC: 3.4 MIL/uL — ABNORMAL LOW (ref 4.22–5.81)
RDW: 15 % (ref 11.5–15.5)
WBC Count: 5.5 10*3/uL (ref 4.0–10.5)
nRBC: 0 % (ref 0.0–0.2)

## 2024-01-23 MED ORDER — SODIUM CHLORIDE 0.9 % IV SOLN
INTRAVENOUS | Status: DC
Start: 2024-01-23 — End: 2024-01-23

## 2024-01-23 MED ORDER — DEXTROSE 5 % IV SOLN
36.0000 mg/m2 | Freq: Once | INTRAVENOUS | Status: AC
  Administered 2024-01-23: 70 mg via INTRAVENOUS
  Filled 2024-01-23: qty 30

## 2024-01-23 MED ORDER — PROCHLORPERAZINE MALEATE 10 MG PO TABS
10.0000 mg | ORAL_TABLET | Freq: Once | ORAL | Status: AC
Start: 2024-01-23 — End: 2024-01-23
  Administered 2024-01-23: 10 mg via ORAL
  Filled 2024-01-23: qty 1

## 2024-01-23 NOTE — Patient Instructions (Signed)
 [facility] - A DEPT OF [facility]  Discharge Instructions: Thank you for choosing [facility] to provide your oncology and hematology care.   If you have a lab appointment with the Cancer Center, please go directly to the Cancer Center and check in at the registration area.  Wear comfortable clothing and clothing appropriate for easy access to any Portacath or PICC line.   We strive to give you quality time with your provider. You may need to reschedule your appointment if you arrive late (15 or more minutes).  Arriving late affects you and other patients whose appointments are after yours.  Also, if you miss three or more appointments without notifying the office, you may be dismissed from the clinic at the provider's discretion.      For prescription refill requests, have your pharmacy contact our office and allow 72 hours for refills to be completed.    Today you received the following chemotherapy and/or immunotherapy agents KYPROLIS   To help prevent nausea and vomiting after your treatment, we encourage you to take your nausea medication as directed.  BELOW ARE SYMPTOMS THAT SHOULD BE REPORTED IMMEDIATELY: *FEVER GREATER THAN 100.4 F (38 C) OR HIGHER *CHILLS OR SWEATING *NAUSEA AND VOMITING THAT IS NOT CONTROLLED WITH YOUR NAUSEA MEDICATION *UNUSUAL SHORTNESS OF BREATH *UNUSUAL BRUISING OR BLEEDING *URINARY PROBLEMS (pain or burning when urinating, or frequent urination) *BOWEL PROBLEMS (unusual diarrhea, constipation, pain near the anus) TENDERNESS IN MOUTH AND THROAT WITH OR WITHOUT PRESENCE OF ULCERS (sore throat, sores in mouth, or a toothache) UNUSUAL RASH, SWELLING OR PAIN  UNUSUAL VAGINAL DISCHARGE OR ITCHING   Items with * indicate a potential emergency and should be followed up as soon as possible or go to the Emergency Department if any problems should occur.  Please show the CHEMOTHERAPY ALERT CARD or IMMUNOTHERAPY ALERT  CARD at check-in to the Emergency Department and triage nurse. Should you have questions after your visit or need to cancel or reschedule your appointment, please contact [facility] - A DEPT OF [facility]  [phone] and follow the prompts.  Office hours are 8:00 a.m. to 4:30 p.m. Monday - Friday. Please note that voicemails left after 4:00 p.m. may not be returned until the following business day.  We are closed weekends and major holidays. You have access to a nurse at all times for urgent questions. Please call the main number to the clinic [phone] and follow the prompts.  For any non-urgent questions, you may also contact your provider using MyChart. We now offer e-Visits for anyone 16 and older to request care online for non-urgent symptoms. For details visit [email or web address].   Also download the MyChart app! Go to the app store, search "MyChart", open the app, select [facility], and log in with your MyChart username and password.

## 2024-01-24 ENCOUNTER — Other Ambulatory Visit: Payer: Self-pay | Admitting: Hematology & Oncology

## 2024-01-30 ENCOUNTER — Inpatient Hospital Stay: Payer: Medicare Other

## 2024-01-30 ENCOUNTER — Other Ambulatory Visit: Payer: Self-pay

## 2024-01-30 VITALS — BP 116/59 | HR 61 | Temp 97.6°F | Resp 20

## 2024-01-30 DIAGNOSIS — Z5112 Encounter for antineoplastic immunotherapy: Secondary | ICD-10-CM | POA: Diagnosis not present

## 2024-01-30 DIAGNOSIS — C9000 Multiple myeloma not having achieved remission: Secondary | ICD-10-CM

## 2024-01-30 LAB — CMP (CANCER CENTER ONLY)
ALT: 8 U/L (ref 0–44)
AST: 10 U/L — ABNORMAL LOW (ref 15–41)
Albumin: 4.5 g/dL (ref 3.5–5.0)
Alkaline Phosphatase: 78 U/L (ref 38–126)
Anion gap: 10 (ref 5–15)
BUN: 21 mg/dL (ref 8–23)
CO2: 23 mmol/L (ref 22–32)
Calcium: 8.7 mg/dL — ABNORMAL LOW (ref 8.9–10.3)
Chloride: 105 mmol/L (ref 98–111)
Creatinine: 1.38 mg/dL — ABNORMAL HIGH (ref 0.61–1.24)
GFR, Estimated: 56 mL/min — ABNORMAL LOW (ref >60)
Glucose, Bld: 107 mg/dL — ABNORMAL HIGH (ref 70–99)
Potassium: 4.5 mmol/L (ref 3.5–5.1)
Sodium: 138 mmol/L (ref 135–145)
Total Bilirubin: 0.6 mg/dL (ref 0.0–1.2)
Total Protein: 6.5 g/dL (ref 6.5–8.1)

## 2024-01-30 LAB — CBC WITH DIFFERENTIAL (CANCER CENTER ONLY)
Abs Immature Granulocytes: 0.03 10*3/uL (ref 0.00–0.07)
Basophils Absolute: 0 10*3/uL (ref 0.0–0.1)
Basophils Relative: 0 %
Eosinophils Absolute: 0.1 10*3/uL (ref 0.0–0.5)
Eosinophils Relative: 2 %
HCT: 34.7 % — ABNORMAL LOW (ref 39.0–52.0)
Hemoglobin: 11.4 g/dL — ABNORMAL LOW (ref 13.0–17.0)
Immature Granulocytes: 0 %
Lymphocytes Relative: 11 %
Lymphs Abs: 0.8 10*3/uL (ref 0.7–4.0)
MCH: 31.7 pg (ref 26.0–34.0)
MCHC: 32.9 g/dL (ref 30.0–36.0)
MCV: 96.4 fL (ref 80.0–100.0)
Monocytes Absolute: 0.7 10*3/uL (ref 0.1–1.0)
Monocytes Relative: 9 %
Neutro Abs: 5.9 10*3/uL (ref 1.7–7.7)
Neutrophils Relative %: 78 %
Platelet Count: 189 10*3/uL (ref 150–400)
RBC: 3.6 MIL/uL — ABNORMAL LOW (ref 4.22–5.81)
RDW: 14.8 % (ref 11.5–15.5)
WBC Count: 7.7 10*3/uL (ref 4.0–10.5)
nRBC: 0 % (ref 0.0–0.2)

## 2024-01-30 MED ORDER — PROCHLORPERAZINE MALEATE 10 MG PO TABS
10.0000 mg | ORAL_TABLET | Freq: Once | ORAL | Status: AC
  Administered 2024-01-30: 10 mg via ORAL
  Filled 2024-01-30: qty 1

## 2024-01-30 MED ORDER — DEXTROSE 5 % IV SOLN
36.0000 mg/m2 | Freq: Once | INTRAVENOUS | Status: AC
  Administered 2024-01-30: 70 mg via INTRAVENOUS
  Filled 2024-01-30: qty 30

## 2024-01-30 MED ORDER — SODIUM CHLORIDE 0.9 % IV SOLN: INTRAVENOUS | Status: DC

## 2024-01-30 MED ORDER — SODIUM CHLORIDE 0.9 % IV SOLN: Freq: Once | INTRAVENOUS | Status: AC

## 2024-01-30 NOTE — Patient Instructions (Signed)
Carfilzomib Injection What is this medication? CARFILZOMIB (kar FILZ oh mib) treats multiple myeloma, a type of bone marrow cancer. It works by blocking a protein that causes cancer cells to grow and multiply. This helps to slow or stop the spread of cancer cells. This medicine may be used for other purposes; ask your health care provider or pharmacist if you have questions. COMMON BRAND NAME(S): KYPROLIS What should I tell my care team before I take this medication? They need to know if you have any of these conditions: Heart disease History of blood clots Irregular heartbeat Kidney disease Liver disease Lung or breathing disease An unusual or allergic reaction to carfilzomib, or other medications, foods, dyes, or preservatives If you or your partner are pregnant or trying to get pregnant Breastfeeding How should I use this medication? This medication is injected into a vein. It is given by your care team in a hospital or clinic setting. Talk to your care team about the use of this medication in children. Special care may be needed. Overdosage: If you think you have taken too much of this medicine contact a poison control center or emergency room at once. NOTE: This medicine is only for you. Do not share this medicine with others. What if I miss a dose? Keep appointments for follow-up doses. It is important not to miss your dose. Call your care team if you are unable to keep an appointment. What may interact with this medication? Interactions are not expected. This list may not describe all possible interactions. Give your health care provider a list of all the medicines, herbs, non-prescription drugs, or dietary supplements you use. Also tell them if you smoke, drink alcohol, or use illegal drugs. Some items may interact with your medicine. What should I watch for while using this medication? Your condition will be monitored carefully while you are receiving this medication. You may need  blood work while taking this medication. Check with your care team if you have severe diarrhea, nausea, and vomiting, or if you sweat a lot. The loss of too much body fluid may make it dangerous for you to take this medication. This medication may affect your coordination, reaction time, or judgment. Do not drive or operate machinery until you know how this medication affects you. Sit up or stand slowly to reduce the risk of dizzy or fainting spells. Drinking alcohol with this medication can increase the risk of these side effects. Talk to your care team if you may be pregnant. Serious birth defects can occur if you take this medication during pregnancy and for 6 months after the last dose. You will need a negative pregnancy test before starting this medication. Contraception is recommended while taking this medication and for 6 months after the last dose. Your care team can help you find an option that works for you. If your partner can get pregnant, use a condom during sex while taking this medication and for 3 months after the last dose. Do not breastfeed while taking this medication and for 2 weeks after the last dose. This medication may cause infertility. Talk to your care team if you are concerned about your fertility. What side effects may I notice from receiving this medication? Side effects that you should report to your care team as soon as possible: Allergic reactions--skin rash, itching, hives, swelling of the face, lips, tongue, or throat Bleeding--bloody or black, tar-like stools, vomiting blood or brown material that looks like coffee grounds, red or dark brown urine,  small red or purple spots on skin, unusual bruising or bleeding Blood clot--pain, swelling, or warmth in the leg, shortness of breath, chest pain Dizziness, loss of balance or coordination, confusion or trouble speaking Heart attack--pain or tightness in the chest, shoulders, arms, or jaw, nausea, shortness of breath, cold  or clammy skin, feeling faint or lightheaded Heart failure--shortness of breath, swelling of the ankles, feet, or hands, sudden weight gain, unusual weakness or fatigue Heart rhythm changes--fast or irregular heartbeat, dizziness, feeling faint or lightheaded, chest pain, trouble breathing Increase in blood pressure Infection--fever, chills, cough, sore throat, wounds that don't heal, pain or trouble when passing urine, general feeling of discomfort or being unwell Infusion reactions--chest pain, shortness of breath or trouble breathing, feeling faint or lightheaded Kidney injury--decrease in the amount of urine, swelling of the ankles, hands, or feet Liver injury--right upper belly pain, loss of appetite, nausea, light-colored stool, dark yellow or brown urine, yellowing skin or eyes, unusual weakness or fatigue Lung injury--shortness of breath or trouble breathing, cough, spitting up blood, chest pain, fever Pulmonary hypertension--shortness of breath, chest pain, fast or irregular heartbeat, feeling faint or lightheaded, fatigue, swelling of the ankles or feet Stomach pain, bloody diarrhea, pale skin, unusual weakness or fatigue, decrease in the amount of urine, which may be signs of hemolytic uremic syndrome Sudden and severe headache, confusion, change in vision, seizures, which may be signs of posterior reversible encephalopathy syndrome (PRES) TTP--purple spots on the skin or inside the mouth, pale skin, yellowing skin or eyes, unusual weakness or fatigue, fever, fast or irregular heartbeat, confusion, change in vision, trouble speaking, trouble walking Tumor lysis syndrome (TLS)--nausea, vomiting, diarrhea, decrease in the amount of urine, dark urine, unusual weakness or fatigue, confusion, muscle pain or cramps, fast or irregular heartbeat, joint pain Side effects that usually do not require medical attention (report to your care team if they continue or are  bothersome): Diarrhea Fatigue Nausea Trouble sleeping This list may not describe all possible side effects. Call your doctor for medical advice about side effects. You may report side effects to FDA at 1-800-FDA-1088. Where should I keep my medication? This medication is given in a hospital or clinic. It will not be stored at home. NOTE: This sheet is a summary. It may not cover all possible information. If you have questions about this medicine, talk to your doctor, pharmacist, or health care provider.  2024 Elsevier/Gold Standard (2022-04-01 00:00:00)

## 2024-02-06 ENCOUNTER — Ambulatory Visit: Payer: Medicare Other

## 2024-02-06 ENCOUNTER — Encounter: Payer: Self-pay | Admitting: Hematology & Oncology

## 2024-02-06 ENCOUNTER — Other Ambulatory Visit: Payer: Medicare Other

## 2024-02-08 ENCOUNTER — Other Ambulatory Visit: Payer: Self-pay | Admitting: Hematology & Oncology

## 2024-02-08 DIAGNOSIS — C9000 Multiple myeloma not having achieved remission: Secondary | ICD-10-CM

## 2024-02-13 ENCOUNTER — Encounter: Payer: Self-pay | Admitting: Hematology & Oncology

## 2024-02-13 ENCOUNTER — Other Ambulatory Visit: Payer: Self-pay

## 2024-02-13 ENCOUNTER — Inpatient Hospital Stay: Payer: Medicare Other

## 2024-02-13 ENCOUNTER — Inpatient Hospital Stay (HOSPITAL_BASED_OUTPATIENT_CLINIC_OR_DEPARTMENT_OTHER): Payer: Medicare Other | Admitting: Hematology & Oncology

## 2024-02-13 VITALS — BP 109/59 | HR 61

## 2024-02-13 VITALS — BP 137/66 | HR 60 | Temp 97.8°F | Resp 18 | Ht 70.0 in | Wt 179.0 lb

## 2024-02-13 DIAGNOSIS — C9000 Multiple myeloma not having achieved remission: Secondary | ICD-10-CM

## 2024-02-13 DIAGNOSIS — Z5112 Encounter for antineoplastic immunotherapy: Secondary | ICD-10-CM | POA: Diagnosis not present

## 2024-02-13 LAB — CMP (CANCER CENTER ONLY)
ALT: 10 U/L (ref 0–44)
AST: 14 U/L — ABNORMAL LOW (ref 15–41)
Albumin: 4.5 g/dL (ref 3.5–5.0)
Alkaline Phosphatase: 81 U/L (ref 38–126)
Anion gap: 11 (ref 5–15)
BUN: 24 mg/dL — ABNORMAL HIGH (ref 8–23)
CO2: 21 mmol/L — ABNORMAL LOW (ref 22–32)
Calcium: 8.8 mg/dL — ABNORMAL LOW (ref 8.9–10.3)
Chloride: 108 mmol/L (ref 98–111)
Creatinine: 1.53 mg/dL — ABNORMAL HIGH (ref 0.61–1.24)
GFR, Estimated: 50 mL/min — ABNORMAL LOW (ref >60)
Glucose, Bld: 100 mg/dL — ABNORMAL HIGH (ref 70–99)
Potassium: 4.4 mmol/L (ref 3.5–5.1)
Sodium: 140 mmol/L (ref 135–145)
Total Bilirubin: 0.6 mg/dL (ref 0.0–1.2)
Total Protein: 6.7 g/dL (ref 6.5–8.1)

## 2024-02-13 LAB — CBC WITH DIFFERENTIAL (CANCER CENTER ONLY)
Abs Immature Granulocytes: 0.01 10*3/uL (ref 0.00–0.07)
Basophils Absolute: 0 10*3/uL (ref 0.0–0.1)
Basophils Relative: 0 %
Eosinophils Absolute: 0.1 10*3/uL (ref 0.0–0.5)
Eosinophils Relative: 2 %
HCT: 34.2 % — ABNORMAL LOW (ref 39.0–52.0)
Hemoglobin: 11.7 g/dL — ABNORMAL LOW (ref 13.0–17.0)
Immature Granulocytes: 0 %
Lymphocytes Relative: 13 %
Lymphs Abs: 0.7 10*3/uL (ref 0.7–4.0)
MCH: 32.3 pg (ref 26.0–34.0)
MCHC: 34.2 g/dL (ref 30.0–36.0)
MCV: 94.5 fL (ref 80.0–100.0)
Monocytes Absolute: 0.5 10*3/uL (ref 0.1–1.0)
Monocytes Relative: 10 %
Neutro Abs: 4.3 10*3/uL (ref 1.7–7.7)
Neutrophils Relative %: 75 %
Platelet Count: 219 10*3/uL (ref 150–400)
RBC: 3.62 MIL/uL — ABNORMAL LOW (ref 4.22–5.81)
RDW: 14.6 % (ref 11.5–15.5)
WBC Count: 5.7 10*3/uL (ref 4.0–10.5)
nRBC: 0 % (ref 0.0–0.2)

## 2024-02-13 MED ORDER — CARFILZOMIB CHEMO INJECTION 60 MG
36.0000 mg/m2 | Freq: Once | INTRAVENOUS | Status: AC
  Administered 2024-02-13: 70 mg via INTRAVENOUS
  Filled 2024-02-13: qty 30

## 2024-02-13 MED ORDER — SODIUM CHLORIDE 0.9 % IV SOLN: INTRAVENOUS | Status: DC

## 2024-02-13 MED ORDER — PROCHLORPERAZINE MALEATE 10 MG PO TABS
10.0000 mg | ORAL_TABLET | Freq: Once | ORAL | Status: AC
  Administered 2024-02-13: 10 mg via ORAL
  Filled 2024-02-13: qty 1

## 2024-02-13 NOTE — Addendum Note (Signed)
 Addended by: Katheran Awe on: 02/13/2024 01:49 PM   Modules accepted: Orders

## 2024-02-13 NOTE — Progress Notes (Signed)
Per Dr. Marin Olp, okay to treat today, despite labs

## 2024-02-13 NOTE — Patient Instructions (Signed)
 [facility] - A DEPT OF [facility]  Discharge Instructions: Thank you for choosing [facility] to provide your oncology and hematology care.   If you have a lab appointment with the Cancer Center, please go directly to the Cancer Center and check in at the registration area.  Wear comfortable clothing and clothing appropriate for easy access to any Portacath or PICC line.   We strive to give you quality time with your provider. You may need to reschedule your appointment if you arrive late (15 or more minutes).  Arriving late affects you and other patients whose appointments are after yours.  Also, if you miss three or more appointments without notifying the office, you may be dismissed from the clinic at the provider's discretion.      For prescription refill requests, have your pharmacy contact our office and allow 72 hours for refills to be completed.    Today you received the following chemotherapy and/or immunotherapy agents kyprolis      To help prevent nausea and vomiting after your treatment, we encourage you to take your nausea medication as directed.  BELOW ARE SYMPTOMS THAT SHOULD BE REPORTED IMMEDIATELY: *FEVER GREATER THAN 100.4 F (38 C) OR HIGHER *CHILLS OR SWEATING *NAUSEA AND VOMITING THAT IS NOT CONTROLLED WITH YOUR NAUSEA MEDICATION *UNUSUAL SHORTNESS OF BREATH *UNUSUAL BRUISING OR BLEEDING *URINARY PROBLEMS (pain or burning when urinating, or frequent urination) *BOWEL PROBLEMS (unusual diarrhea, constipation, pain near the anus) TENDERNESS IN MOUTH AND THROAT WITH OR WITHOUT PRESENCE OF ULCERS (sore throat, sores in mouth, or a toothache) UNUSUAL RASH, SWELLING OR PAIN  UNUSUAL VAGINAL DISCHARGE OR ITCHING   Items with * indicate a potential emergency and should be followed up as soon as possible or go to the Emergency Department if any problems should occur.  Please show the CHEMOTHERAPY ALERT CARD or IMMUNOTHERAPY  ALERT CARD at check-in to the Emergency Department and triage nurse. Should you have questions after your visit or need to cancel or reschedule your appointment, please contact [facility] - A DEPT OF [facility]  [phone] and follow the prompts.  Office hours are 8:00 a.m. to 4:30 p.m. Monday - Friday. Please note that voicemails left after 4:00 p.m. may not be returned until the following business day.  We are closed weekends and major holidays. You have access to a nurse at all times for urgent questions. Please call the main number to the clinic [phone] and follow the prompts.  For any non-urgent questions, you may also contact your provider using MyChart. We now offer e-Visits for anyone 71 and older to request care online for non-urgent symptoms. For details visit [email or web address].   Also download the MyChart app! Go to the app store, search "MyChart", open the app, select [place], and log in with your MyChart username and password.

## 2024-02-13 NOTE — Progress Notes (Signed)
 For this.  I think Hematology and Oncology Follow Up Visit  Barry CAHALAN [id number] 02/23/1956 68 y.o. 02/13/2024   Principle Diagnosis:  IgA Kappa myeloma -- normal cytogenetics/FISH (-) --relapse  Current Therapy:   S/p cervical spine stabilization -- 04/28/2022 Faspro/Velcade/Decadron -- s/p cycle #6-- start on 05/20/2022 Zometa 4 mg IV q 3 months -- d/c on 08/05/2023 due to jaw pain Status post autologous stem cell transplant -- [facility] on 12/03/2022 Faspro/Velcade -- maintenance -  start on 04/28/2023 -Velcade discontinued after 07/08/2023 Kyprolis/pomalidomide --status post cycle #3 -- start on 11/14/2023 --pomalidomide DC on 11/28/2023  -restarted at 2 mg p.o. daily on 12/26/2023     Interim History:  [name] is back for follow-up.  So far, he has done incredibly well.  His numbers have come down quite nicely with treatment.  The last time that we saw him, the monoclonal spike was not found in his blood.  His IgA level was down to 58 mg/dL.  Kappa light chain was holding steady at 2.4 mg/dL.  He has had his T cells collected.  It sounds like he may go for his CAR-T therapy sometime in late April.  Otherwise, he has been feeling pretty good.  He has had no fever.  He has had no nausea or vomiting.  He has had no bleeding.  He has been no rashes.  We have him on a lower dose of the pomalidomide which she seems to be doing well with.  He has had no leg swelling.  He has had no neuropathic issues.  Overall, I would have to say that his performance status is probably ECOG 1.     Wt Readings from Last 3 Encounters:  01/02/24 180 lb (81.6 kg)  12/26/23 174 lb (78.9 kg)  12/12/23 176 lb (79.8 kg)   Medications:  Current Outpatient Medications:    acyclovir (ZOVIRAX) 400 MG tablet, Take 1 tablet (400 mg total) by mouth 2 (two) times daily., Disp: 60 tablet, Rfl: 3   aspirin 81 MG chewable tablet, Chew 81 mg by mouth daily. (Patient not taking: Reported on 12/26/2023), Disp: , Rfl:     cetirizine (ZYRTEC ALLERGY) 10 MG tablet, Take 1 tablet (10 mg total) by mouth at bedtime. (Patient not taking: Reported on 12/26/2023), Disp: 14 tablet, Rfl: 0   dexamethasone (DECADRON) 4 MG tablet, Take one tablet (4mg ) prior to Kyprolis treatments once a week times three weeks and then one week off (Patient not taking: Reported on 12/26/2023), Disp: 30 tablet, Rfl: 2   famotidine (PEPCID) 20 MG tablet, Take 1 tablet (20 mg total) by mouth 2 (two) times daily. (Patient not taking: Reported on 12/26/2023), Disp: 30 tablet, Rfl: 0   glipiZIDE (GLUCOTROL) 5 MG tablet, TAKE 1 TABLET (5 MG TOTAL) BY MOUTH TWICE A DAY BEFORE MEALS, Disp: 180 tablet, Rfl: 1   lactulose (CHRONULAC) 10 GM/15ML solution, TAKE 15 MLS (10 G TOTAL) BY MOUTH 3 (THREE) TIMES DAILY., Disp: 946 mL, Rfl: 2   levothyroxine (SYNTHROID) 75 MCG tablet, Take 75 mcg by mouth daily before breakfast. (Patient not taking: Reported on 12/26/2023), Disp: , Rfl:    lidocaine-prilocaine (EMLA) cream, Apply 1 Application topically. Apply a dime size of cream to port-a-cath 1-2 hours prior to access. Cover with [employer]. (Patient not taking: Reported on 12/26/2023), Disp: , Rfl:    metFORMIN (GLUCOPHAGE) 500 MG tablet, Take 500 mg by mouth 2 (two) times daily with a meal., Disp: , Rfl:    ondansetron (ZOFRAN)  8 MG tablet, Take 1 tablet (8 mg total) by mouth every 8 (eight) hours as needed for nausea or vomiting. (Patient not taking: Reported on 12/26/2023), Disp: 30 tablet, Rfl: 1   pomalidomide (POMALYST) 2 MG capsule, Take 1 capsule (2 mg total) by mouth daily. Patient to take one capsule (2 mg total) for 21 days on, 7 days off.  [employer] Auth # [id number]   Date Obtained 01/10/2024, Disp: 21 capsule, Rfl: 0   prochlorperazine (COMPAZINE) 10 MG tablet, Take 1 tablet (10 mg total) by mouth every 6 (six) hours as needed for nausea or vomiting. (Patient not taking: Reported on 12/26/2023), Disp: 30 tablet, Rfl: 1   tamsulosin (FLOMAX) 0.4 MG CAPS capsule,  Take 0.4 mg by mouth daily., Disp: , Rfl:   Allergies:  Allergies  Allergen Reactions   Revlimid [Lenalidomide] Rash    Past Medical History, Surgical history, Social history, and Family History were reviewed and updated.  Review of Systems: Review of Systems  Constitutional: Negative.   HENT:  Negative.    Eyes: Negative.   Respiratory: Negative.    Cardiovascular: Negative.   Gastrointestinal:  Positive for diarrhea.  Endocrine: Negative.   Genitourinary:  Negative for bladder incontinence and difficulty urinating.   Musculoskeletal:  Positive for neck pain.  Skin:  Positive for itching and rash.  Neurological: Negative.   Hematological: Negative.   Psychiatric/Behavioral: Negative.      Physical Exam:  Vital signs show temperature of 97.8.  Pulse 60.  Blood pressure 137/66.  Weight is 179 pounds.      Wt Readings from Last 3 Encounters:  01/02/24 180 lb (81.6 kg)  12/26/23 174 lb (78.9 kg)  12/12/23 176 lb (79.8 kg)    Physical Exam Vitals reviewed.  HENT:     Head: Normocephalic and atraumatic.  Eyes:     Pupils: Pupils are equal, round, and reactive to light.  Cardiovascular:     Rate and Rhythm: Normal rate and regular rhythm.     Heart sounds: Normal heart sounds.  Pulmonary:     Effort: Pulmonary effort is normal.     Breath sounds: Normal breath sounds.  Abdominal:     General: Bowel sounds are normal.     Palpations: Abdomen is soft.  Musculoskeletal:        General: No tenderness or deformity. Normal range of motion.     Cervical back: Normal range of motion.  Lymphadenopathy:     Cervical: No cervical adenopathy.  Skin:    General: Skin is warm and dry.     Findings: No erythema. Rash: systemic erythematic maculopapular rash. Most extensive around the lower torso. Neurological:     Mental Status: He is alert and oriented to person, place, and time.  Psychiatric:        Behavior: Behavior normal.        Thought Content: Thought content  normal.        Judgment: Judgment normal.      Lab Results  Component Value Date   WBC 5.7 02/13/2024   HGB 11.7 (L) 02/13/2024   HCT 34.2 (L) 02/13/2024   MCV 94.5 02/13/2024   PLT 219 02/13/2024     Chemistry      Component Value Date/Time   NA 138 01/30/2024 0836   K 4.5 01/30/2024 0836   CL 105 01/30/2024 0836   CO2 23 01/30/2024 0836   BUN 21 01/30/2024 0836   CREATININE 1.38 (H) 01/30/2024 1914  Component Value Date/Time   CALCIUM 8.7 (L) 01/30/2024 0836   ALKPHOS 78 01/30/2024 0836   AST 10 (L) 01/30/2024 0836   ALT 8 01/30/2024 0836   BILITOT 0.6 01/30/2024 0836       Impression and Plan: Mr. Kutner is a very nice 68 year old white male.  He has IgA kappa myeloma.  Most of his myeloma is Kappa light chain with recurrence.  He underwent upfront chemotherapy and then underwent transplant.  He had his transplant a year ago.  He then relapsed late last year.  He is done so well.  I will not adjust the dose of the Kyprolis upward.  Again I am very happy with how well he is done.  Hopefully, he will do well with the pomalidomide.  At this point, I probable plan to get him back to see me in about 6 weeks or so.  I would think that he will be at [facility] for a while.  They will let us know when he is ready to come back.   Josph Macho, MD 3/31/20258:50 AM

## 2024-02-14 ENCOUNTER — Other Ambulatory Visit: Payer: Self-pay

## 2024-02-14 LAB — KAPPA/LAMBDA LIGHT CHAINS
Kappa free light chain: 32.9 mg/L — ABNORMAL HIGH (ref 3.3–19.4)
Kappa, lambda light chain ratio: 9.4 — ABNORMAL HIGH (ref 0.26–1.65)
Lambda free light chains: 3.5 mg/L — ABNORMAL LOW (ref 5.7–26.3)

## 2024-02-14 LAB — IGG, IGA, IGM
IgA: 76 mg/dL (ref 61–437)
IgG (Immunoglobin G), Serum: 205 mg/dL — ABNORMAL LOW (ref 603–1613)
IgM (Immunoglobulin M), Srm: 8 mg/dL — ABNORMAL LOW (ref 20–172)

## 2024-02-16 LAB — PROTEIN ELECTROPHORESIS, SERUM, WITH REFLEX
A/G Ratio: 1.5 (ref 0.7–1.7)
Albumin ELP: 3.8 g/dL (ref 2.9–4.4)
Alpha-1-Globulin: 0.3 g/dL (ref 0.0–0.4)
Alpha-2-Globulin: 1 g/dL (ref 0.4–1.0)
Beta Globulin: 0.9 g/dL (ref 0.7–1.3)
Gamma Globulin: 0.3 g/dL — ABNORMAL LOW (ref 0.4–1.8)
Globulin, Total: 2.5 g/dL (ref 2.2–3.9)
Total Protein ELP: 6.3 g/dL (ref 6.0–8.5)

## 2024-02-20 ENCOUNTER — Inpatient Hospital Stay: Payer: Medicare Other | Attending: Hematology & Oncology

## 2024-02-20 ENCOUNTER — Inpatient Hospital Stay: Payer: Medicare Other

## 2024-02-20 ENCOUNTER — Other Ambulatory Visit: Payer: Self-pay

## 2024-02-20 VITALS — BP 135/73 | HR 64 | Temp 97.8°F | Resp 18

## 2024-02-20 DIAGNOSIS — Z5112 Encounter for antineoplastic immunotherapy: Secondary | ICD-10-CM | POA: Insufficient documentation

## 2024-02-20 DIAGNOSIS — C9002 Multiple myeloma in relapse: Secondary | ICD-10-CM | POA: Insufficient documentation

## 2024-02-20 DIAGNOSIS — C9000 Multiple myeloma not having achieved remission: Secondary | ICD-10-CM

## 2024-02-20 LAB — CBC WITH DIFFERENTIAL (CANCER CENTER ONLY)
Abs Immature Granulocytes: 0.02 10*3/uL (ref 0.00–0.07)
Basophils Absolute: 0 10*3/uL (ref 0.0–0.1)
Basophils Relative: 0 %
Eosinophils Absolute: 0.1 10*3/uL (ref 0.0–0.5)
Eosinophils Relative: 2 %
HCT: 37.3 % — ABNORMAL LOW (ref 39.0–52.0)
Hemoglobin: 12.3 g/dL — ABNORMAL LOW (ref 13.0–17.0)
Immature Granulocytes: 0 %
Lymphocytes Relative: 15 %
Lymphs Abs: 0.8 10*3/uL (ref 0.7–4.0)
MCH: 31.3 pg (ref 26.0–34.0)
MCHC: 33 g/dL (ref 30.0–36.0)
MCV: 94.9 fL (ref 80.0–100.0)
Monocytes Absolute: 0.7 10*3/uL (ref 0.1–1.0)
Monocytes Relative: 13 %
Neutro Abs: 3.8 10*3/uL (ref 1.7–7.7)
Neutrophils Relative %: 70 %
Platelet Count: 190 10*3/uL (ref 150–400)
RBC: 3.93 MIL/uL — ABNORMAL LOW (ref 4.22–5.81)
RDW: 14.6 % (ref 11.5–15.5)
WBC Count: 5.5 10*3/uL (ref 4.0–10.5)
nRBC: 0 % (ref 0.0–0.2)

## 2024-02-20 LAB — CMP (CANCER CENTER ONLY)
ALT: 13 U/L (ref 0–44)
AST: 15 U/L (ref 15–41)
Albumin: 4.6 g/dL (ref 3.5–5.0)
Alkaline Phosphatase: 95 U/L (ref 38–126)
Anion gap: 9 (ref 5–15)
BUN: 28 mg/dL — ABNORMAL HIGH (ref 8–23)
CO2: 23 mmol/L (ref 22–32)
Calcium: 9.3 mg/dL (ref 8.9–10.3)
Chloride: 108 mmol/L (ref 98–111)
Creatinine: 1.55 mg/dL — ABNORMAL HIGH (ref 0.61–1.24)
GFR, Estimated: 49 mL/min — ABNORMAL LOW (ref >60)
Glucose, Bld: 112 mg/dL — ABNORMAL HIGH (ref 70–99)
Potassium: 5 mmol/L (ref 3.5–5.1)
Sodium: 140 mmol/L (ref 135–145)
Total Bilirubin: 0.6 mg/dL (ref 0.0–1.2)
Total Protein: 6.7 g/dL (ref 6.5–8.1)

## 2024-02-20 MED ORDER — PROCHLORPERAZINE MALEATE 10 MG PO TABS
10.0000 mg | ORAL_TABLET | Freq: Once | ORAL | Status: AC
  Administered 2024-02-20: 10 mg via ORAL
  Filled 2024-02-20: qty 1

## 2024-02-20 MED ORDER — DEXTROSE 5 % IV SOLN
36.0000 mg/m2 | Freq: Once | INTRAVENOUS | Status: AC
  Administered 2024-02-20: 70 mg via INTRAVENOUS
  Filled 2024-02-20: qty 30

## 2024-02-20 MED ORDER — SODIUM CHLORIDE 0.9 % IV SOLN
INTRAVENOUS | Status: DC
Start: 2024-02-20 — End: 2024-02-20

## 2024-02-20 MED ORDER — LACTULOSE 10 GM/15ML PO SOLN: 10.0000 g | Freq: Three times a day (TID) | ORAL | 2 refills | Status: DC

## 2024-02-20 NOTE — Progress Notes (Signed)
 Ok to proceed with treatment today with Scr 1.55.  Anola Gurney [place], [place], BCPS, BCOP 02/20/2024 9:20 AM

## 2024-02-20 NOTE — Patient Instructions (Signed)
 [facility]  Discharge Instructions: Thank you for choosing [facility] to provide your oncology and hematology care.   If you have a lab appointment with the Cancer Center, please go directly to the Cancer Center and check in at the registration area.  Wear comfortable clothing and clothing appropriate for easy access to any Portacath or PICC line.   We strive to give you quality time with your provider. You may need to reschedule your appointment if you arrive late (15 or more minutes).  Arriving late affects you and other patients whose appointments are after yours.  Also, if you miss three or more appointments without notifying the office, you may be dismissed from the clinic at the provider's discretion.      For prescription refill requests, have your pharmacy contact our office and allow 72 hours for refills to be completed.    Today you received the following chemotherapy and/or immunotherapy agents:  Kyprolis      To help prevent nausea and vomiting after your treatment, we encourage you to take your nausea medication as directed.  BELOW ARE SYMPTOMS THAT SHOULD BE REPORTED IMMEDIATELY: *FEVER GREATER THAN 100.4 F (38 C) OR HIGHER *CHILLS OR SWEATING *NAUSEA AND VOMITING THAT IS NOT CONTROLLED WITH YOUR NAUSEA MEDICATION *UNUSUAL SHORTNESS OF BREATH *UNUSUAL BRUISING OR BLEEDING *URINARY PROBLEMS (pain or burning when urinating, or frequent urination) *BOWEL PROBLEMS (unusual diarrhea, constipation, pain near the anus) TENDERNESS IN MOUTH AND THROAT WITH OR WITHOUT PRESENCE OF ULCERS (sore throat, sores in mouth, or a toothache) UNUSUAL RASH, SWELLING OR PAIN  UNUSUAL VAGINAL DISCHARGE OR ITCHING   Items with * indicate a potential emergency and should be followed up as soon as possible or go to the Emergency Department if any problems should occur.  Please show the CHEMOTHERAPY ALERT CARD or IMMUNOTHERAPY  ALERT CARD at check-in to the Emergency Department and triage nurse. Should you have questions after your visit or need to cancel or reschedule your appointment, please contact [facility] - A DEPT OF [facility]  [phone] and follow the prompts.  Office hours are 8:00 a.m. to 4:30 p.m. Monday - Friday. Please note that voicemails left after 4:00 p.m. may not be returned until the following business day.  We are closed weekends and major holidays. You have access to a nurse at all times for urgent questions. Please call the main number to the clinic [phone] and follow the prompts.  For any non-urgent questions, you may also contact your provider using MyChart. We now offer e-Visits for anyone 25 and older to request care online for non-urgent symptoms. For details visit [email or web address].   Also download the MyChart app! Go to the app store, search "MyChart", open the app, select [place], and log in with your MyChart username and password.

## 2024-02-27 ENCOUNTER — Inpatient Hospital Stay: Payer: Medicare Other

## 2024-03-26 ENCOUNTER — Inpatient Hospital Stay

## 2024-03-26 ENCOUNTER — Ambulatory Visit: Admitting: Hematology & Oncology

## 2024-03-29 ENCOUNTER — Other Ambulatory Visit: Payer: Self-pay | Admitting: Hematology & Oncology

## 2024-03-29 DIAGNOSIS — C9000 Multiple myeloma not having achieved remission: Secondary | ICD-10-CM

## 2024-04-03 ENCOUNTER — Other Ambulatory Visit: Payer: Self-pay

## 2024-04-06 ENCOUNTER — Other Ambulatory Visit: Payer: Self-pay

## 2024-04-27 ENCOUNTER — Other Ambulatory Visit: Payer: Self-pay

## 2024-06-14 ENCOUNTER — Other Ambulatory Visit: Payer: Self-pay

## 2024-07-12 ENCOUNTER — Other Ambulatory Visit: Payer: Self-pay | Admitting: Hematology & Oncology

## 2024-07-12 DIAGNOSIS — C9000 Multiple myeloma not having achieved remission: Secondary | ICD-10-CM

## 2024-07-29 ENCOUNTER — Other Ambulatory Visit: Payer: Self-pay

## 2024-08-23 ENCOUNTER — Other Ambulatory Visit: Payer: Self-pay

## 2024-09-21 ENCOUNTER — Other Ambulatory Visit: Payer: Self-pay | Admitting: Hematology & Oncology

## 2024-09-21 DIAGNOSIS — C9000 Multiple myeloma not having achieved remission: Secondary | ICD-10-CM

## 2024-09-26 ENCOUNTER — Other Ambulatory Visit: Payer: Self-pay

## 2024-11-04 ENCOUNTER — Other Ambulatory Visit: Payer: Self-pay | Admitting: Hematology & Oncology

## 2024-11-04 DIAGNOSIS — C9000 Multiple myeloma not having achieved remission: Secondary | ICD-10-CM

## 2024-11-05 ENCOUNTER — Encounter: Payer: Self-pay | Admitting: Hematology & Oncology

## 2024-11-12 ENCOUNTER — Other Ambulatory Visit: Payer: Self-pay

## 2024-12-03 ENCOUNTER — Other Ambulatory Visit: Payer: Self-pay | Admitting: Hematology & Oncology

## 2024-12-03 DIAGNOSIS — C9000 Multiple myeloma not having achieved remission: Secondary | ICD-10-CM
